# Patient Record
Sex: Female | Born: 1960 | Race: White | Hispanic: No | Marital: Married | Smoking: Former smoker
Health system: Southern US, Community
[De-identification: ages and names within clinical notes are randomized; demographics above are authoritative.]

## PROBLEM LIST (undated history)

## (undated) DIAGNOSIS — F4310 Post-traumatic stress disorder, unspecified: Secondary | ICD-10-CM

## (undated) DIAGNOSIS — K8020 Calculus of gallbladder without cholecystitis without obstruction: Secondary | ICD-10-CM

## (undated) DIAGNOSIS — F319 Bipolar disorder, unspecified: Secondary | ICD-10-CM

## (undated) DIAGNOSIS — F411 Generalized anxiety disorder: Secondary | ICD-10-CM

## (undated) DIAGNOSIS — Z8041 Family history of malignant neoplasm of ovary: Secondary | ICD-10-CM

## (undated) DIAGNOSIS — Z800 Family history of malignant neoplasm of digestive organs: Secondary | ICD-10-CM

## (undated) DIAGNOSIS — Z8481 Family history of carrier of genetic disease: Secondary | ICD-10-CM

## (undated) DIAGNOSIS — F1010 Alcohol abuse, uncomplicated: Secondary | ICD-10-CM

## (undated) DIAGNOSIS — IMO0002 Reserved for concepts with insufficient information to code with codable children: Secondary | ICD-10-CM

## (undated) DIAGNOSIS — R87629 Unspecified abnormal cytological findings in specimens from vagina: Secondary | ICD-10-CM

## (undated) DIAGNOSIS — Z9989 Dependence on other enabling machines and devices: Secondary | ICD-10-CM

## (undated) DIAGNOSIS — F419 Anxiety disorder, unspecified: Secondary | ICD-10-CM

## (undated) DIAGNOSIS — E669 Obesity, unspecified: Secondary | ICD-10-CM

## (undated) DIAGNOSIS — Z9289 Personal history of other medical treatment: Secondary | ICD-10-CM

## (undated) DIAGNOSIS — M1990 Unspecified osteoarthritis, unspecified site: Secondary | ICD-10-CM

## (undated) DIAGNOSIS — Z808 Family history of malignant neoplasm of other organs or systems: Secondary | ICD-10-CM

## (undated) DIAGNOSIS — N883 Incompetence of cervix uteri: Secondary | ICD-10-CM

## (undated) DIAGNOSIS — R9389 Abnormal findings on diagnostic imaging of other specified body structures: Principal | ICD-10-CM

## (undated) DIAGNOSIS — R87619 Unspecified abnormal cytological findings in specimens from cervix uteri: Secondary | ICD-10-CM

## (undated) DIAGNOSIS — R42 Dizziness and giddiness: Secondary | ICD-10-CM

## (undated) DIAGNOSIS — F909 Attention-deficit hyperactivity disorder, unspecified type: Secondary | ICD-10-CM

## (undated) DIAGNOSIS — Z803 Family history of malignant neoplasm of breast: Secondary | ICD-10-CM

## (undated) DIAGNOSIS — G4733 Obstructive sleep apnea (adult) (pediatric): Secondary | ICD-10-CM

## (undated) DIAGNOSIS — H6690 Otitis media, unspecified, unspecified ear: Secondary | ICD-10-CM

## (undated) DIAGNOSIS — J189 Pneumonia, unspecified organism: Secondary | ICD-10-CM

## (undated) DIAGNOSIS — F329 Major depressive disorder, single episode, unspecified: Secondary | ICD-10-CM

## (undated) DIAGNOSIS — I10 Essential (primary) hypertension: Secondary | ICD-10-CM

## (undated) HISTORY — DX: Essential (primary) hypertension: I10

## (undated) HISTORY — PX: COLONOSCOPY: SHX174

## (undated) HISTORY — DX: Dizziness and giddiness: R42

## (undated) HISTORY — DX: Anxiety disorder, unspecified: F41.9

## (undated) HISTORY — DX: Post-traumatic stress disorder, unspecified: F43.10

## (undated) HISTORY — DX: Family history of malignant neoplasm of other organs or systems: Z80.8

## (undated) HISTORY — DX: Family history of malignant neoplasm of breast: Z80.3

## (undated) HISTORY — PX: CERVICAL CERCLAGE: SHX1329

## (undated) HISTORY — DX: Incompetence of cervix uteri: N88.3

## (undated) HISTORY — DX: Major depressive disorder, single episode, unspecified: F32.9

## (undated) HISTORY — PX: CATARACT EXTRACTION: SUR2

## (undated) HISTORY — DX: Family history of malignant neoplasm of ovary: Z80.41

## (undated) HISTORY — DX: Unspecified osteoarthritis, unspecified site: M19.90

## (undated) HISTORY — DX: Family history of carrier of genetic disease: Z84.81

## (undated) HISTORY — PX: JOINT REPLACEMENT: SHX530

## (undated) HISTORY — DX: Obstructive sleep apnea (adult) (pediatric): Z99.89

## (undated) HISTORY — DX: Reserved for concepts with insufficient information to code with codable children: IMO0002

## (undated) HISTORY — DX: Generalized anxiety disorder: F41.1

## (undated) HISTORY — DX: Otitis media, unspecified, unspecified ear: H66.90

## (undated) HISTORY — PX: TONSILLECTOMY: SUR1361

## (undated) HISTORY — DX: Bipolar disorder, unspecified: F31.9

## (undated) HISTORY — DX: Attention-deficit hyperactivity disorder, unspecified type: F90.9

## (undated) HISTORY — DX: Obesity, unspecified: E66.9

## (undated) HISTORY — PX: DILATION AND CURETTAGE OF UTERUS: SHX78

## (undated) HISTORY — DX: Abnormal findings on diagnostic imaging of other specified body structures: R93.89

## (undated) HISTORY — DX: Unspecified abnormal cytological findings in specimens from cervix uteri: R87.619

## (undated) HISTORY — DX: Unspecified abnormal cytological findings in specimens from vagina: R87.629

## (undated) HISTORY — DX: Obstructive sleep apnea (adult) (pediatric): G47.33

## (undated) HISTORY — DX: Family history of malignant neoplasm of digestive organs: Z80.0

---

## 2006-07-07 ENCOUNTER — Encounter: Admission: RE | Admit: 2006-07-07 | Discharge: 2006-07-07 | Payer: Self-pay | Admitting: Internal Medicine

## 2009-06-08 ENCOUNTER — Encounter: Admission: RE | Admit: 2009-06-08 | Discharge: 2009-06-08 | Payer: Self-pay | Admitting: Internal Medicine

## 2009-07-10 ENCOUNTER — Other Ambulatory Visit: Admission: RE | Admit: 2009-07-10 | Discharge: 2009-07-10 | Payer: Self-pay | Admitting: Obstetrics and Gynecology

## 2010-06-29 ENCOUNTER — Ambulatory Visit (HOSPITAL_COMMUNITY): Admission: RE | Admit: 2010-06-29 | Discharge: 2010-06-29 | Payer: Self-pay | Admitting: Obstetrics & Gynecology

## 2010-08-02 ENCOUNTER — Other Ambulatory Visit: Admission: RE | Admit: 2010-08-02 | Discharge: 2010-08-02 | Payer: Self-pay | Admitting: Obstetrics and Gynecology

## 2011-01-21 ENCOUNTER — Encounter: Payer: Self-pay | Admitting: Internal Medicine

## 2011-08-27 ENCOUNTER — Other Ambulatory Visit (HOSPITAL_COMMUNITY)
Admission: RE | Admit: 2011-08-27 | Discharge: 2011-08-27 | Disposition: A | Discharge status: Home / Self Care | Payer: 59 | Source: Ambulatory Visit | Attending: Obstetrics and Gynecology | Admitting: Obstetrics and Gynecology

## 2011-08-27 ENCOUNTER — Other Ambulatory Visit: Payer: Self-pay | Admitting: Adult Health

## 2011-08-27 DIAGNOSIS — Z01419 Encounter for gynecological examination (general) (routine) without abnormal findings: Secondary | ICD-10-CM | POA: Insufficient documentation

## 2011-10-21 ENCOUNTER — Encounter: Payer: Self-pay | Admitting: *Deleted

## 2011-10-21 ENCOUNTER — Emergency Department (HOSPITAL_COMMUNITY): Payer: BC Managed Care – PPO

## 2011-10-21 ENCOUNTER — Emergency Department (HOSPITAL_COMMUNITY)
Admission: EM | Admit: 2011-10-21 | Discharge: 2011-10-21 | Disposition: A | Discharge status: Home / Self Care | Payer: BC Managed Care – PPO | Attending: Emergency Medicine | Admitting: Emergency Medicine

## 2011-10-21 DIAGNOSIS — R079 Chest pain, unspecified: Secondary | ICD-10-CM | POA: Insufficient documentation

## 2011-10-21 DIAGNOSIS — Z7982 Long term (current) use of aspirin: Secondary | ICD-10-CM | POA: Insufficient documentation

## 2011-10-21 DIAGNOSIS — I10 Essential (primary) hypertension: Secondary | ICD-10-CM | POA: Insufficient documentation

## 2011-10-21 DIAGNOSIS — R002 Palpitations: Secondary | ICD-10-CM | POA: Insufficient documentation

## 2011-10-21 DIAGNOSIS — M79603 Pain in arm, unspecified: Secondary | ICD-10-CM

## 2011-10-21 DIAGNOSIS — Z87891 Personal history of nicotine dependence: Secondary | ICD-10-CM | POA: Insufficient documentation

## 2011-10-21 DIAGNOSIS — M79609 Pain in unspecified limb: Secondary | ICD-10-CM | POA: Insufficient documentation

## 2011-10-21 DIAGNOSIS — R209 Unspecified disturbances of skin sensation: Secondary | ICD-10-CM | POA: Insufficient documentation

## 2011-10-21 LAB — BASIC METABOLIC PANEL
BUN: 11 mg/dL (ref 6–23)
Chloride: 103 mEq/L (ref 96–112)
Creatinine, Ser: 0.95 mg/dL (ref 0.50–1.10)
GFR calc Af Amer: 80 mL/min — ABNORMAL LOW (ref >90)
Sodium: 140 mEq/L (ref 135–145)

## 2011-10-21 LAB — CBC
Hemoglobin: 13.1 g/dL (ref 12.0–15.0)
Platelets: 277 10*3/uL (ref 150–400)
RBC: 4.33 MIL/uL (ref 3.87–5.11)
RDW: 13.1 % (ref 11.5–15.5)
WBC: 5.6 10*3/uL (ref 4.0–10.5)

## 2011-10-21 LAB — DIFFERENTIAL
Basophils Absolute: 0 10*3/uL (ref 0.0–0.1)
Lymphs Abs: 1.6 10*3/uL (ref 0.7–4.0)
Monocytes Absolute: 0.4 10*3/uL (ref 0.1–1.0)
Neutro Abs: 3.5 10*3/uL (ref 1.7–7.7)

## 2011-10-21 LAB — CARDIAC PANEL(CRET KIN+CKTOT+MB+TROPI): Troponin I: <0.3 ng/mL (ref <0.30)

## 2011-10-21 NOTE — ED Notes (Signed)
Pt states she could "hear her heart beating in her ear" last night and her BP was 194/109 last night at 2230. Pt states "feels like heart is running away" and "lump in chest like when you get nervous" . Pt also states tingling to left arm since last night. Pt states she was lying on left side last night. States arm hurt last night but is tingling this morning. NAD

## 2011-10-21 NOTE — ED Notes (Signed)
Patient is feeling a little better and does not need anything at this time.

## 2011-10-21 NOTE — ED Provider Notes (Signed)
History   This chart was scribed for Benny Lennert, MD by Clarita Crane. The patient was seen in room APA12/APA12 and the patient's care was started at 11:01AM.   CSN: [id number] Arrival date & time: 10/21/2011  9:57 AM   First MD Initiated Contact with Patient 10/21/11 1057      Chief Complaint  Patient presents with  . left arm numbness.     (Consider location/radiation/quality/duration/timing/severity/associated sxs/prior treatment) HPI [name] [name] is a 50 y.o. female who presents to the Emergency Department complaining of intermittent LUE pain and numbness extending to left hand onset last night and persistent since with associated palpitations. Patient also reports having an episode of chest tightness last night and that she could "hear my heartbeat in my ears". Patient reports she measured her blood pressure with onset of pain last night with a value of 194/109. Denies HA, abdominal pain, nausea, vomiting, SOB. Patient reports she has a h/o hypertension and is currently on a low dose of HCTZ. Also notes she is a former smoker and has a family h/o strokes.   HPI ELEMENTS:  Location: LUE extending to left hand Onset: last night Duration: persistent since onset  Timing: intermittent  Context:  as above  Associated symptoms: +chest tightness, palpitations. Denies HA, abdominal pain, nausea, vomiting, SOB.    Past Medical History  Diagnosis Date  . Hypertension     Past Surgical History  Procedure Date  . Tonsillectomy     No family history on file.  History  Substance Use Topics  . Smoking status: Former Games developer  . Smokeless tobacco: Not on file  . Alcohol Use: 0.6 oz/week    1 Glasses of wine per week     one glass of wine daily    OB History    Grav Para Term Preterm Abortions TAB SAB Ect Mult Living                  Review of Systems  Constitutional: Negative for fatigue.  HENT: Negative for congestion, sinus pressure and ear discharge.   Eyes:  Negative for discharge.  Respiratory: Negative for cough.   Cardiovascular: Positive for chest pain and palpitations.  Gastrointestinal: Negative for abdominal pain and diarrhea.  Genitourinary: Negative for frequency and hematuria.  Musculoskeletal: Negative for back pain.       Left arm pain.   Skin: Negative for rash.  Neurological: Positive for numbness. Negative for seizures and headaches.  Hematological: Negative.   Psychiatric/Behavioral: Negative for hallucinations.    Allergies  Review of patient's allergies indicates no known allergies.  Home Medications   Current Outpatient Rx  Name Route Sig Dispense Refill  . ASPIRIN EC 81 MG PO TBEC Oral Take 81 mg by mouth daily as needed. pain     . HYDROCHLOROTHIAZIDE 12.5 MG PO CAPS Oral Take 12.5 mg by mouth daily.        BP 131/73  Pulse 85  Temp(Src) 97.7 F (36.5 C) (Oral)  Resp 14  Ht 5\' 6"  (1.676 m)  Wt 205 lb (92.987 kg)  BMI 33.09 kg/m2  SpO2 100%  Physical Exam  Nursing note and vitals reviewed. Constitutional: She is oriented to person, place, and time. She appears well-developed and well-nourished. No distress.  HENT:  Head: Normocephalic and atraumatic.  Eyes: Conjunctivae and EOM are normal. No scleral icterus.  Neck: Neck supple.  Cardiovascular: Normal rate, regular rhythm and normal heart sounds.  Exam reveals no gallop and no friction rub.  No murmur heard. Pulmonary/Chest: Effort normal. No stridor. No respiratory distress. She has no wheezes. She has no rales.  Abdominal: Soft. She exhibits no distension. There is no tenderness. There is no rebound.  Musculoskeletal: Normal range of motion. She exhibits no edema.  Neurological: She is alert and oriented to person, place, and time.  Skin: Skin is warm and dry.  Psychiatric: She has a normal mood and affect. Her behavior is normal.    ED Course  Procedures (including critical care time)  DIAGNOSTIC STUDIES: Oxygen Saturation is 97% on room  air, normal by my interpretation.    COORDINATION OF CARE: 2:42PM- Patient expresses interest in being d/c home. States LUE pain and numbness has improved at this time. Informed of lab and imaging results. Will d/c patient home with recommendation to take 1 aspirin a day and follow up with cardiology.   Labs Reviewed  BASIC METABOLIC PANEL - Abnormal; Notable for the following:    Glucose, Bld 114 (*)    GFR calc non Af Amer 69 (*)    GFR calc Af Amer 80 (*)    All other components within normal limits  CBC  DIFFERENTIAL  POCT I-STAT TROPONIN I  CARDIAC PANEL(CRET KIN+CKTOT+MB+TROPI)  I-STAT TROPONIN I   Ct Head Wo Contrast  10/21/2011  *RADIOLOGY REPORT*  Clinical Data: Left upper extremity numbness, pain  CT HEAD WITHOUT CONTRAST  Technique:  Contiguous axial images were obtained from the base of the skull through the vertex without contrast.  Comparison: None.  Findings: No evidence of parenchymal hemorrhage or extra-axial fluid collection. No mass lesion, mass effect, or midline shift.  No CT evidence of acute infarction.  Subcortical white matter and periventricular hypodensities, nonspecific, but statistically likely reflecting small vessel ischemic changes.  The visualized paranasal sinuses are essentially clear. The mastoid air cells are unopacified.  No evidence of calvarial fracture.  IMPRESSION: No evidence of acute intracranial abnormality.  Suspected small vessel ischemic changes.  Original Report Authenticated By: Charline Bills, M.D.   Dg Chest Portable 1 View  10/21/2011  *RADIOLOGY REPORT*  Clinical Data: Chest pain and hypertension  PORTABLE CHEST - 1 VIEW  Comparison: None.  Findings: The cardiac silhouette, mediastinum, pulmonary vasculature are within normal limits.  Both lungs are clear. There is no acute bony abnormality.  IMPRESSION: There is no evidence of acute cardiac or pulmonary process.  Original Report Authenticated By: Brandon Melnick, M.D.     1. Arm pain        MDM  Arm pain and numbness.  Neuritis,  Stress reaction   Date: 10/21/2011  Rate:80  Rhythm: normal sinus rhythm  QRS Axis: normal  Intervals: normal  ST/T Wave abnormalities: normal nonspecific st changes  Conduction Disutrbances:none  Narrative Interpretation:   Old EKG Reviewed: none available    Benny Lennert, MD 10/21/11 1500

## 2011-10-30 ENCOUNTER — Encounter: Payer: Self-pay | Admitting: Cardiology

## 2011-11-05 ENCOUNTER — Encounter: Payer: Self-pay | Admitting: Cardiology

## 2011-11-05 ENCOUNTER — Ambulatory Visit (INDEPENDENT_AMBULATORY_CARE_PROVIDER_SITE_OTHER): Payer: BC Managed Care – PPO | Admitting: Cardiology

## 2011-11-05 VITALS — BP 182/92 | HR 89 | Resp 16 | Ht 66.0 in | Wt 211.0 lb

## 2011-11-05 DIAGNOSIS — M79609 Pain in unspecified limb: Secondary | ICD-10-CM | POA: Insufficient documentation

## 2011-11-05 DIAGNOSIS — R9431 Abnormal electrocardiogram [ECG] [EKG]: Secondary | ICD-10-CM | POA: Insufficient documentation

## 2011-11-05 DIAGNOSIS — I10 Essential (primary) hypertension: Secondary | ICD-10-CM

## 2011-11-05 NOTE — Assessment & Plan Note (Signed)
Nonspecific, possible left atrial enlargement on initial tracing, although less evident today. Could be consistent with hypertension in general.

## 2011-11-05 NOTE — Patient Instructions (Signed)
Your physician recommends that you continue on your current medications as directed. Please refer to the Current Medication list given to you today.  Your physician has requested that you have an exercise tolerance test. For further information please visit [email or web address] Please also follow instruction sheet, as given.  Your physician recommends that you schedule a follow-up appointment in: we will contact you with results of test

## 2011-11-05 NOTE — Progress Notes (Signed)
Clinical Summary [name] is a 50 y.o.female referred for cardiology consultation after recent ER visit, by Dr. Estell [name]. She states that she follows with a nurse practitioner at her place of business, and has also seen Dr. Regino [name] in the past. She reports a history of hypertension on medical therapy for at least the last 2 years. She states that she tends to avoid regular doctor visits.  She states that her blood pressure was elevated at "194/109" back in late October with some "numbness in left arm with feeling of swelling." She states that this symptom lasted for several hours. She did not seek medical attention until the following day. Emergency department evaluation revealed no acute findings by head CT scan, no acute process by chest x-ray. Lab work showed normal troponin, potassium 3.7, BUN 11, creatinine 0.9.  She reports that she gained a significant amount of weight after quitting smoking several years ago. Has not been exercising regularly. She reports compliance with low dose HCTZ.  ECG from October 22 showed sinus rhythm with nonspecific ST changes and possible left atrial enlargement. Repeat tracing today is essentially normal.  Today we discussed risk factor modification strategies, diet and sodium restriction, exercise and weight loss. I also reinforced the importance of establishing regular primary care followup.   No Known Allergies  Medication list reviewed.  Past Medical History  Diagnosis Date  . Essential hypertension, benign     Past Surgical History  Procedure Date  . Tonsillectomy     Family History  Problem Relation Age of Onset  . Hypertension Father   . Hypertension Mother   . Cancer Mother   . Coronary artery disease Maternal Grandfather     Social History [name] reports that she has quit smoking. Her smoking use included Cigarettes. She has never used smokeless tobacco. [name] reports that she drinks about .6 ounces of alcohol per week.  Review  of Systems Occasional palpitations, no dizziness or syncope. No orthopnea or PND. No lower extremity edema. Reports work-related stress. Otherwise reviewed and negative.  Physical Examination Filed Vitals:   11/05/11 0847  BP: 182/92  Pulse: 89  Resp: 16   Overweight woman in no acute distress. HEENT: Conjunctiva and lids normal, oropharynx with moist mucosa. Neck: Supple, no elevated JVP or carotid bruits, no thyromegaly. Lungs: Clear to auscultation, nonlabored. Cardiac: Regular rate and rhythm, no S3 gallop or rub, no significant murmur. Abdomen: Soft, no bruits, bowel sounds present, nontender. Skin: Warm and dry. Extremities: No pitting edema, distal pulses full. Musculoskeletal: No kyphosis. Neuropsychiatric: Alert and oriented x3, moves all extremities equally. Affect grossly appropriate.    Problem List and Plan

## 2011-11-05 NOTE — Assessment & Plan Note (Signed)
Blood pressure reading is elevated with baseline history of hypertension. Patient reports compliance with low dose HCTZ. Today I reinforced the importance of establishing regular primary care followup for her blood pressure. We also discussed diet and sodium restriction, exercise, and weight loss. Recent lab work was reviewed. I suggested that she increase her HCTZ to 25 mg daily, and schedule a followup visit with Dr. Edison Simon office. She may ultimately need a potassium supplement, possibly even additional antihypertensive therapy, although I hope that lifestyle modification will lead to improvement in her blood pressure as well.

## 2011-11-05 NOTE — Assessment & Plan Note (Signed)
Etiology not certain. Recent head CT scan did not suggest acute event. She reports no recurrence. Troponin was normal at ER visit. Symptoms were noted in the setting of significant blood pressure elevation. In light of this, and other features already discussed, a basic treadmill test will be undertaken for general risk factor modification. This will also provide documentation of exercise blood pressure control.

## 2011-11-14 ENCOUNTER — Ambulatory Visit (INDEPENDENT_AMBULATORY_CARE_PROVIDER_SITE_OTHER): Payer: BC Managed Care – PPO | Admitting: *Deleted

## 2011-11-14 DIAGNOSIS — R9431 Abnormal electrocardiogram [ECG] [EKG]: Secondary | ICD-10-CM

## 2011-11-14 DIAGNOSIS — I10 Essential (primary) hypertension: Secondary | ICD-10-CM

## 2011-11-14 DIAGNOSIS — M79609 Pain in unspecified limb: Secondary | ICD-10-CM

## 2011-11-14 NOTE — Progress Notes (Signed)
Stress Lab Nurses Notes - [facility]  IDY RAWLING 11/14/2011  Reason for doing test: HTN and pain discomfort in left arm Type of test: Regular GTX Nurse performing test: Parke Poisson, RN Nuclear Medicine Tech: Not Applicable Echo Tech: Not Applicable MD performing test: Ival Bible & Joni Reining NP Family [id number] Test explained and consent signed: yes IV started: No IV started Symptoms: Fatigue in R Knee and Feeling hot Treatment/Intervention: None Reason test stopped: fatigue After recovery IV was: NA Patient to return to Nuc. Med at :NA Patient discharged: Home Patient's Condition upon discharge was: stable Comments: During test peak BP 208/80 & HR 172. Recovery BP 148/80 & HR 98.  Symptoms resolved in recovery.  Erskine Speed T

## 2011-11-14 NOTE — Progress Notes (Signed)
Patient exercised on a standard Bruce protocol for 7 minutes and 28 seconds, achieving a maximum workload of 9.2 METS. Heart rate increased from 79 beats per minute up to 171 beats per minute reaching 100% of the maximum age predicted heart rate response. Peak blood pressure was 208/80. No anginal chest pain was reported. Baseline ECG shows nonspecific inferolateral ST segment change with approximately 1/2 mm depression at rest and T wave flattening. There is accentuation in these abnormalities with exercise, overall equivocal. No arrhythmias were noted.

## 2011-11-18 ENCOUNTER — Telehealth: Payer: Self-pay | Admitting: Cardiology

## 2011-11-18 ENCOUNTER — Telehealth: Payer: Self-pay

## 2011-11-18 NOTE — Telephone Encounter (Signed)
PT IS RETURNING CALL. SHE DOESNT KNOW WHO CALLED BUT HAD A STRESS TEST DONE LAST WEEK/TMJ

## 2011-11-18 NOTE — Telephone Encounter (Signed)
Message copied by Demetrios Loll on Mon Nov 18, 2011 10:54 AM ------      Message from: MCDOWELL, Illene Bolus      Created: Thu Nov 14, 2011  5:23 PM       I reviewed the GXT report. No chest pain, and equivocal ST segment changes noted in the setting of baseline ST abnormalities, likely repolarization related to hypertension. There was a significant hypertensive response to exercise. At this point would recommend risk factor modification and medical therapy for high blood pressure, with followup at her primary care office. We can certainly see her back if symptoms progress.

## 2012-01-07 ENCOUNTER — Encounter (HOSPITAL_COMMUNITY): Payer: Self-pay | Admitting: *Deleted

## 2012-01-07 ENCOUNTER — Emergency Department (HOSPITAL_COMMUNITY)
Admission: EM | Admit: 2012-01-07 | Discharge: 2012-01-07 | Disposition: A | Discharge status: Home / Self Care | Payer: BC Managed Care – PPO | Attending: Emergency Medicine | Admitting: Emergency Medicine

## 2012-01-07 DIAGNOSIS — L259 Unspecified contact dermatitis, unspecified cause: Secondary | ICD-10-CM | POA: Insufficient documentation

## 2012-01-07 DIAGNOSIS — Z79899 Other long term (current) drug therapy: Secondary | ICD-10-CM | POA: Insufficient documentation

## 2012-01-07 DIAGNOSIS — I10 Essential (primary) hypertension: Secondary | ICD-10-CM | POA: Insufficient documentation

## 2012-01-07 DIAGNOSIS — Z7982 Long term (current) use of aspirin: Secondary | ICD-10-CM | POA: Insufficient documentation

## 2012-01-07 DIAGNOSIS — Z203 Contact with and (suspected) exposure to rabies: Secondary | ICD-10-CM | POA: Insufficient documentation

## 2012-01-07 DIAGNOSIS — Z23 Encounter for immunization: Secondary | ICD-10-CM | POA: Insufficient documentation

## 2012-01-07 NOTE — ED Notes (Signed)
Pt states they were taking care of dogs that had been exposed to rabies.  No blood or saliva exposure

## 2012-01-07 NOTE — ED Notes (Signed)
Pt left before being DC, unable to obtain signature

## 2012-01-07 NOTE — ED Provider Notes (Addendum)
The patient is a 51 year old, female.  Her dogs were attack by a skunk 4 days ago.  The stomach was rapid.  There was a live on the dogs for.  She cleaned.  Her saliva off the dogs with her bare hands.  She was sent here for rabies vaccination.  There are no breaks in the skin of her hands or her arms.  She has lichenified skin on her left thumb.  There is no evidence that she had a break in her skin and saliva exposure to compromise.  Skin.  I do not think that she needs rabies vaccination.  Nicholes Stairs, MD 01/07/12 [phone]  Medical screening examination/treatment/procedure(s) were conducted as a shared visit with non-physician practitioner(s) and myself.  I personally evaluated the patient during the encounter  Nicholes Stairs, MD 01/07/12 1620

## 2012-01-07 NOTE — ED Notes (Signed)
Exposure to rabid skunk

## 2012-01-08 NOTE — ED Provider Notes (Addendum)
History     CSN: [id number]  Arrival date & time 01/07/12  1351   First MD Initiated Contact with Patient 01/07/12 1458      Chief Complaint  Patient presents with  . Rabies Injection    (Consider location/radiation/quality/duration/timing/severity/associated sxs/prior treatment) HPI Comments: Patients daughter woke 4 days ago to find a skunk in her dog lot chasing and attempting to attack her dogs.  The animal was killed and found to be positive for rabies today.  She reports her exposure was possibly to saliva on her dogs as she helped her daughter move the dogs from the lot and bathed them,  Although she did not have any direct contact with blood (no blood or bites found on her animals).  Her dogs were re- immunized by her vet even though they were current on their rabies vaccines.  She was encouraged to come here for rabies vaccination.   The history is provided by the patient.    Past Medical History  Diagnosis Date  . Essential hypertension, benign     Past Surgical History  Procedure Date  . Tonsillectomy     Family History  Problem Relation Age of Onset  . Hypertension Father   . Hypertension Mother   . Cancer Mother   . Coronary artery disease Maternal Grandfather     History  Substance Use Topics  . Smoking status: Former Smoker    Types: Cigarettes  . Smokeless tobacco: Never Used  . Alcohol Use: 0.6 oz/week    1 Glasses of wine per week     One glass of wine daily    OB History    Grav Para Term Preterm Abortions TAB SAB Ect Mult Living                  Review of Systems  Constitutional: Negative for fever.  HENT: Negative.   Eyes: Negative.   Respiratory: Negative.   Cardiovascular: Negative.   Gastrointestinal: Negative.   Genitourinary: Negative.   Musculoskeletal: Negative for joint swelling and arthralgias.  Skin: Negative.  Negative for rash and wound.  Neurological: Negative for dizziness, weakness, light-headedness, numbness and  headaches.  Hematological: Negative.   Psychiatric/Behavioral: Negative.     Allergies  Prednisone  Home Medications   Current Outpatient Rx  Name Route Sig Dispense Refill  . ASPIRIN EC 81 MG PO TBEC Oral Take 81 mg by mouth at bedtime. pain    . HYDROCHLOROTHIAZIDE 25 MG PO TABS Oral Take 25 mg by mouth daily.        BP 158/91  Pulse 93  Temp(Src) 98.3 F (36.8 C) (Oral)  Resp 20  Ht 5\' 6"  (1.676 m)  Wt 215 lb (97.523 kg)  BMI 34.70 kg/m2  SpO2 96%  Physical Exam  Nursing note and vitals reviewed. Constitutional: She is oriented to person, place, and time. She appears well-developed and well-nourished.  HENT:  Head: Normocephalic and atraumatic.  Eyes: Conjunctivae are normal.  Neck: Normal range of motion.  Cardiovascular: Normal rate.   Pulmonary/Chest: Effort normal and breath sounds normal.  Musculoskeletal: Normal range of motion.  Neurological: She is alert and oriented to person, place, and time.  Skin: Skin is warm and dry.       Hyperkeratotic area on left lateral hand from eczema.  No open lesions.  Psychiatric: She has a normal mood and affect.    ED Course  Procedures (including critical care time)  Labs Reviewed - No data to display No results  found.   1. Contact with and suspected exposure to rabies       MDM  Discussed risk factors for rabies including blood to blood or saliva to blood contact.  Patient also seen by Dr. Weldon Inches who agrees that contact was not one requiring rabies immunoglobulin or vaccine.         Candis Musa, PA 01/08/12 2235    Patient and other family members (daughter and her fiance)  were offered the vaccines by Dr. Weldon Inches,  If desired,  But not medically indicated.  Ms. Kamaljit Hizer deferred,  But stated she just needed this statement in writing that the shots are not needed so she can show this to the [facility],  As they had called patients numerous times insisting they  come in here to get treated.    Candis Musa, PA 01/09/12 1744

## 2012-01-09 NOTE — ED Provider Notes (Signed)
Medical screening examination/treatment/procedure(s) were conducted as a shared visit with non-physician practitioner(s) and myself.  I personally evaluated the patient during the encounter  Nicholes Stairs, MD 01/09/12 [phone]

## 2012-01-14 NOTE — ED Provider Notes (Signed)
Medical screening examination/treatment/procedure(s) were conducted as a shared visit with non-physician practitioner(s) and myself.  I personally evaluated the patient during the encounter  Nicholes Stairs, MD 01/14/12 [phone]

## 2012-01-23 ENCOUNTER — Other Ambulatory Visit: Payer: Self-pay | Admitting: Adult Health

## 2012-01-23 DIAGNOSIS — N63 Unspecified lump in unspecified breast: Secondary | ICD-10-CM

## 2012-01-29 ENCOUNTER — Ambulatory Visit (HOSPITAL_COMMUNITY)
Admission: RE | Admit: 2012-01-29 | Discharge: 2012-01-29 | Disposition: A | Discharge status: Home / Self Care | Payer: BC Managed Care – PPO | Source: Ambulatory Visit | Attending: Adult Health | Admitting: Adult Health

## 2012-01-29 ENCOUNTER — Ambulatory Visit (HOSPITAL_COMMUNITY): Admission: RE | Admit: 2012-01-29 | Payer: BC Managed Care – PPO | Source: Ambulatory Visit

## 2012-01-29 DIAGNOSIS — N63 Unspecified lump in unspecified breast: Secondary | ICD-10-CM

## 2012-03-20 ENCOUNTER — Encounter: Payer: Self-pay | Admitting: Pulmonary Disease

## 2012-03-20 ENCOUNTER — Ambulatory Visit (INDEPENDENT_AMBULATORY_CARE_PROVIDER_SITE_OTHER): Payer: BC Managed Care – PPO | Admitting: Pulmonary Disease

## 2012-03-20 VITALS — BP 146/88 | HR 98 | Temp 97.8°F | Ht 66.0 in | Wt 223.0 lb

## 2012-03-20 DIAGNOSIS — G4733 Obstructive sleep apnea (adult) (pediatric): Secondary | ICD-10-CM

## 2012-03-20 NOTE — Progress Notes (Signed)
  Subjective:    Patient ID: [name] [name], female    DOB: 1961/04/06, 51 y.o.   MRN: [id number]  HPI The patient is a 51 year old female who comes in today for evaluation of possible obstructive sleep apnea.  She's been noted to have loud snoring, as well as an abnormal breathing pattern during sleep by her husband.  She has frequent awakenings at night, and does not feel rested in the mornings upon arising.  She denies sleep pressure during the day in her job because she stays very busy, but she does begin to feel sleep pressure at lunch and later in the afternoon.  She also has sleepiness in the evenings watching television or movies.  She denies any sleepiness with driving.  Of note, the patient's weight is up 30 pounds over the last one year, and her Epworth score today is 12.  Sleep Questionnaire: What time do you typically go to bed?( Between what hours) 9 to 10 pm How long does it take you to fall asleep? 10 to 20 mins How many times during the night do you wake up? What time do you get out of bed to start your day? 0600 Do you drive or operate heavy machinery in your occupation? No How much has your weight changed (up or down) over the past two years? (In pounds) 25 lb (11.34 kg) Have you ever had a sleep study before? No Do you currently use CPAP? No Do you wear oxygen at any time?     Review of Systems  Constitutional: Positive for unexpected weight change. Negative for fever.  HENT: Negative for ear pain, nosebleeds, congestion, sore throat, rhinorrhea, sneezing, trouble swallowing, dental problem, postnasal drip and sinus pressure.   Eyes: Negative for redness and itching.  Respiratory: Negative for cough, chest tightness, shortness of breath and wheezing.   Cardiovascular: Negative for palpitations and leg swelling.  Gastrointestinal: Negative for nausea and vomiting.  Genitourinary: Negative for dysuria.  Musculoskeletal: Positive for joint swelling.  Skin: Negative for rash.    Neurological: Negative for headaches.  Hematological: Does not bruise/bleed easily.  Psychiatric/Behavioral: Negative for dysphoric mood. The patient is not nervous/anxious.        Objective:   Physical Exam Constitutional:  Obese female, no acute distress  HENT:  Nares patent without discharge  Oropharynx without exudate, palate and uvula are moderately elongated.   Eyes:  Perrla, eomi, no scleral icterus  Neck:  No JVD, no TMG  Cardiovascular:  Normal rate, regular rhythm, no rubs or gallops.  No murmurs        Intact distal pulses  Pulmonary :  Normal breath sounds, no stridor or respiratory distress   No rales, rhonchi, or wheezing  Abdominal:  Soft, nondistended, bowel sounds present.  No tenderness noted.   Musculoskeletal:  No lower extremity edema noted.  Lymph Nodes:  No cervical lymphadenopathy noted  Skin:  No cyanosis noted  Neurologic:  Alert, appropriate, moves all 4 extremities without obvious deficit.         Assessment & Plan:

## 2012-03-20 NOTE — Patient Instructions (Signed)
Will set up for home sleep testing, and will arrange followup once results available.  Work on weight loss.

## 2012-03-20 NOTE — Assessment & Plan Note (Signed)
The patient has a history of loud snoring, and abnormal breathing pattern during sleep, frequent awakenings, and nonrestorative sleep.  This when combined with her excessive weight gain over the last one year is very suspicious for clinically significant sleep apnea.  I have had a long discussion with her about sleep apnea, including its impact on quality of life and cardiovascular health.  I think she needs a sleep study for evaluation, and is a good candidate for home sleep testing.

## 2012-03-26 ENCOUNTER — Ambulatory Visit (INDEPENDENT_AMBULATORY_CARE_PROVIDER_SITE_OTHER): Payer: BC Managed Care – PPO | Admitting: Pulmonary Disease

## 2012-03-26 DIAGNOSIS — G4733 Obstructive sleep apnea (adult) (pediatric): Secondary | ICD-10-CM

## 2012-03-30 ENCOUNTER — Telehealth: Payer: Self-pay | Admitting: Pulmonary Disease

## 2012-03-30 NOTE — Telephone Encounter (Signed)
Will forward to [facility] nurse per triage protocol.

## 2012-03-30 NOTE — Telephone Encounter (Signed)
Patient needs ov to review sleep study results

## 2012-03-31 NOTE — Telephone Encounter (Signed)
Called and spoke with pt.  Pt states she will have to check her schedule regarding an appt date/time and will call me back.

## 2012-03-31 NOTE — Telephone Encounter (Signed)
Pt called back and is scheduled to see [facility] tomorrow at 4 pm

## 2012-04-01 ENCOUNTER — Encounter: Payer: Self-pay | Admitting: Pulmonary Disease

## 2012-04-01 ENCOUNTER — Ambulatory Visit (INDEPENDENT_AMBULATORY_CARE_PROVIDER_SITE_OTHER): Payer: BC Managed Care – PPO | Admitting: Pulmonary Disease

## 2012-04-01 VITALS — BP 132/84 | HR 85 | Temp 98.1°F | Ht 66.0 in | Wt 221.4 lb

## 2012-04-01 DIAGNOSIS — G4733 Obstructive sleep apnea (adult) (pediatric): Secondary | ICD-10-CM

## 2012-04-01 NOTE — Patient Instructions (Signed)
Will start on cpap at a moderate level.  Please call if having issues with tolerance Work on weight loss followup with me in 5 weeks.

## 2012-04-01 NOTE — Assessment & Plan Note (Signed)
The patient has mild to moderate obstructive sleep apnea by her recent sleep study, and I have reviewed the various treatment options with her.  These include a trial of weight loss, upper airway surgery, dental appliance, and also CPAP.  I think a dental appliance and CPAP while working on weight loss and represent her best treatment options given the fact that she is so symptomatic, and after a long discussion we have decided to try CPAP first. I will set the patient up on cpap at a moderate pressure level to allow for desensitization, and will troubleshoot the device over the next 4-6weeks if needed.  The pt is to call me if having issues with tolerance.  Will then optimize the pressure once patient is able to wear cpap on a consistent basis.

## 2012-04-01 NOTE — Progress Notes (Signed)
  Subjective:    Patient ID: [name] [name], female    DOB: [birth year], 51 y.o.   MRN: [id number]  HPI The patient comes in today for followup of her recent sleep test.  She was found to have an AHI of 14 events per hour, with transient desaturation as low as 71%.  I have reviewed the study with her in detail, and answered all of her questions.   Review of Systems  Constitutional: Negative.  Negative for fever and unexpected weight change.  HENT: Negative.  Negative for ear pain, nosebleeds, congestion, sore throat, rhinorrhea, sneezing, trouble swallowing, dental problem, postnasal drip and sinus pressure.   Eyes: Negative.  Negative for redness and itching.  Respiratory: Negative.  Negative for cough, chest tightness, shortness of breath and wheezing.   Cardiovascular: Negative.  Negative for palpitations and leg swelling.  Gastrointestinal: Negative.  Negative for nausea and vomiting.  Genitourinary: Negative.  Negative for dysuria.  Musculoskeletal: Negative.  Negative for joint swelling.  Skin: Negative.  Negative for rash.  Neurological: Negative.  Negative for headaches.  Hematological: Negative.  Does not bruise/bleed easily.  Psychiatric/Behavioral: Negative.  Negative for dysphoric mood. The patient is not nervous/anxious.        Objective:   Physical Exam Overweight female in no acute distress Nose without purulence or discharge noted Lower extremities without edema, no cyanosis Alert and oriented, moves all 4 extremities.       Assessment & Plan:

## 2012-05-08 ENCOUNTER — Ambulatory Visit: Payer: BC Managed Care – PPO | Admitting: Pulmonary Disease

## 2012-05-26 ENCOUNTER — Encounter: Payer: Self-pay | Admitting: Pulmonary Disease

## 2012-05-26 ENCOUNTER — Ambulatory Visit (INDEPENDENT_AMBULATORY_CARE_PROVIDER_SITE_OTHER): Payer: BC Managed Care – PPO | Admitting: Pulmonary Disease

## 2012-05-26 VITALS — BP 118/78 | HR 85 | Temp 97.9°F | Ht 66.0 in | Wt 222.2 lb

## 2012-05-26 DIAGNOSIS — G4733 Obstructive sleep apnea (adult) (pediatric): Secondary | ICD-10-CM

## 2012-05-26 NOTE — Progress Notes (Signed)
  Subjective:    Patient ID: [name] [name], female    DOB: 1961/05/30, 51 y.o.   MRN: [id number]  HPI The patient comes in today for followup of her known obstructive sleep apnea.  She has been started on CPAP, and has been wearing the device compliantly.  She feels that she sleeps well during the night, and has had improvement in her daytime alertness.  She is having no significant mask issues or pressure or intolerances.   Review of Systems  Constitutional: Negative.  Negative for fever and unexpected weight change.  HENT: Negative.  Negative for ear pain, nosebleeds, congestion, sore throat, rhinorrhea, sneezing, trouble swallowing, dental problem, postnasal drip and sinus pressure.   Eyes: Negative.  Negative for redness and itching.  Respiratory: Negative.  Negative for cough, chest tightness, shortness of breath and wheezing.   Cardiovascular: Negative.  Negative for palpitations and leg swelling.  Gastrointestinal: Negative.  Negative for nausea and vomiting.  Genitourinary: Negative.  Negative for dysuria.  Musculoskeletal: Negative.  Negative for joint swelling.  Skin: Negative.  Negative for rash.  Neurological: Negative.  Negative for headaches.  Hematological: Negative.  Does not bruise/bleed easily.  Psychiatric/Behavioral: Negative.  Negative for dysphoric mood. The patient is not nervous/anxious.        Objective:   Physical Exam Obese female in no acute distress No skin breakdown or pressure necrosis from the CPAP mask Lower extremities with no edema, no cyanosis Alert and oriented, moves all 4 extremities.  Does not appear to be overly sleepy.       Assessment & Plan:

## 2012-05-26 NOTE — Assessment & Plan Note (Signed)
The patient is wearing CPAP compliantly for treatment of her obstructive sleep apnea.  She feels that she has had improvement in her sleep and daytime alertness.  At this point, we need to optimize her pressure, and will do this on the automatic setting.  I will let her know the results of the download.  I've also encouraged her to work aggressively on weight loss.

## 2012-05-26 NOTE — Patient Instructions (Signed)
Will optimize your pressure on the auto setting for 2 weeks, and let you know the results. Work on weight loss followup with me in 6mos.

## 2012-06-16 ENCOUNTER — Ambulatory Visit: Payer: BC Managed Care – PPO | Admitting: Pulmonary Disease

## 2012-06-29 ENCOUNTER — Ambulatory Visit: Payer: BC Managed Care – PPO | Admitting: Pulmonary Disease

## 2012-07-22 ENCOUNTER — Other Ambulatory Visit: Payer: Self-pay | Admitting: Obstetrics & Gynecology

## 2012-08-02 ENCOUNTER — Other Ambulatory Visit: Payer: Self-pay | Admitting: Pulmonary Disease

## 2012-08-02 DIAGNOSIS — G4733 Obstructive sleep apnea (adult) (pediatric): Secondary | ICD-10-CM

## 2012-08-04 ENCOUNTER — Telehealth: Payer: Self-pay | Admitting: Pulmonary Disease

## 2012-08-04 DIAGNOSIS — G4733 Obstructive sleep apnea (adult) (pediatric): Secondary | ICD-10-CM

## 2012-08-04 NOTE — Telephone Encounter (Signed)
This message is per rhonda. Please advise KC thanks

## 2012-08-04 NOTE — Telephone Encounter (Signed)
Ok with me to keep on auto setting.

## 2012-08-05 NOTE — Telephone Encounter (Signed)
I have placed order to [facility] for this and pt is aware. She had no questions and needed nothing further

## 2012-10-07 ENCOUNTER — Other Ambulatory Visit: Payer: Self-pay | Admitting: Adult Health

## 2012-10-07 DIAGNOSIS — Z01419 Encounter for gynecological examination (general) (routine) without abnormal findings: Secondary | ICD-10-CM | POA: Insufficient documentation

## 2012-10-07 DIAGNOSIS — Z1151 Encounter for screening for human papillomavirus (HPV): Secondary | ICD-10-CM | POA: Insufficient documentation

## 2012-10-14 ENCOUNTER — Other Ambulatory Visit (HOSPITAL_COMMUNITY)
Admission: RE | Admit: 2012-10-14 | Discharge: 2012-10-14 | Disposition: A | Discharge status: Home / Self Care | Payer: BC Managed Care – PPO | Source: Ambulatory Visit | Attending: Obstetrics and Gynecology | Admitting: Obstetrics and Gynecology

## 2012-11-10 ENCOUNTER — Telehealth: Payer: Self-pay | Admitting: Pulmonary Disease

## 2012-11-10 NOTE — Telephone Encounter (Signed)
Recall Appointment: °Called patient to schedule follow up apt 3 times and left message. Sent letter 11/10/12.  °

## 2013-04-14 ENCOUNTER — Encounter: Payer: Self-pay | Admitting: *Deleted

## 2013-04-14 DIAGNOSIS — F419 Anxiety disorder, unspecified: Secondary | ICD-10-CM | POA: Insufficient documentation

## 2013-04-14 DIAGNOSIS — I10 Essential (primary) hypertension: Secondary | ICD-10-CM

## 2013-04-15 ENCOUNTER — Encounter: Payer: Self-pay | Admitting: Adult Health

## 2013-04-15 ENCOUNTER — Ambulatory Visit (INDEPENDENT_AMBULATORY_CARE_PROVIDER_SITE_OTHER): Payer: BC Managed Care – PPO | Admitting: Adult Health

## 2013-04-15 VITALS — BP 126/88 | HR 80 | Ht 66.0 in | Wt 237.0 lb

## 2013-04-15 DIAGNOSIS — F32A Depression, unspecified: Secondary | ICD-10-CM | POA: Insufficient documentation

## 2013-04-15 DIAGNOSIS — I10 Essential (primary) hypertension: Secondary | ICD-10-CM

## 2013-04-15 DIAGNOSIS — F411 Generalized anxiety disorder: Secondary | ICD-10-CM

## 2013-04-15 DIAGNOSIS — F329 Major depressive disorder, single episode, unspecified: Secondary | ICD-10-CM

## 2013-04-15 DIAGNOSIS — F419 Anxiety disorder, unspecified: Secondary | ICD-10-CM

## 2013-04-15 HISTORY — DX: Depression, unspecified: F32.A

## 2013-04-15 NOTE — Progress Notes (Signed)
Subjective:     Patient ID: [name] [name], female   DOB: 01-08-1961, 52 y.o.   MRN: [id number]  HPI [name] [name] is a 13 year white female in for follow up of anxiety and depression.She saw Velta Addison 4/15 and has a follow up in 2 months.She is currently on Lamictal 100 mg per day and lexapro 20 mg per day and xanax prn. She is doing better and is getting out of the house and recently went on a field trip to [place] with god daughter(which is a big step for her).   Review of SystemsComplains of weight gain, about 30 pounds in 6 months.Still has bad days, but is better. Reviewed past medical, surgical, social and family history. Reviewed medications and allergies.     Objective:   Physical Exam Blood pressure 126/88, pulse 80, height 5\' 6"  (1.676 m), weight 237 lb (107.502 kg).   Skin:warm and dry. Lungs: clear to ausculation bilaterally. Cardiovascular: regular rate and rhythm. Psych: alert, cooperative, mood better Assessment:      Anxiety Depression  Hypertension    Plan:      Continue current meds Increase activity and decrease carbs See Casimiro Needle as scheduled Follow up in 6 months Call prn problems

## 2013-04-15 NOTE — Patient Instructions (Addendum)
Follow up 6 months Increase activity Watch carbs Sign up for my chart

## 2013-06-08 ENCOUNTER — Ambulatory Visit (INDEPENDENT_AMBULATORY_CARE_PROVIDER_SITE_OTHER): Payer: BC Managed Care – PPO | Admitting: Pulmonary Disease

## 2013-06-08 ENCOUNTER — Encounter: Payer: Self-pay | Admitting: Gastroenterology

## 2013-06-08 ENCOUNTER — Encounter: Payer: Self-pay | Admitting: Pulmonary Disease

## 2013-06-08 VITALS — BP 118/78 | HR 90 | Temp 98.0°F | Ht 66.0 in | Wt 247.6 lb

## 2013-06-08 DIAGNOSIS — G4733 Obstructive sleep apnea (adult) (pediatric): Secondary | ICD-10-CM

## 2013-06-08 NOTE — Progress Notes (Signed)
  Subjective:    Patient ID: [name] [name], female    DOB: April 04, 1961, 52 y.o.   MRN: [id number]  HPI The patient comes in today for followup of her obstructive sleep apnea.  She is wearing CPAP compliantly, and feels that it continues to help her sleep and daytime alertness.  She is having mask issues with the full face mask, and would like to consider going back to nasal pillows.  She had lost significant weight, but unfortunately has gained this back.   Review of Systems  Constitutional: Negative for fever and unexpected weight change.  HENT: Negative for ear pain, nosebleeds, congestion, sore throat, rhinorrhea, sneezing, trouble swallowing, dental problem, postnasal drip and sinus pressure.   Eyes: Negative for redness and itching.  Respiratory: Positive for shortness of breath. Negative for cough, chest tightness and wheezing.   Cardiovascular: Negative for palpitations and leg swelling.  Gastrointestinal: Negative for nausea and vomiting.  Genitourinary: Negative for dysuria.  Musculoskeletal: Negative for joint swelling.  Skin: Negative for rash.  Neurological: Negative for headaches.  Hematological: Does not bruise/bleed easily.  Psychiatric/Behavioral: Negative for dysphoric mood. The patient is nervous/anxious ( panic attacks ).        Objective:   Physical Exam Obese female in no acute distress Nose with purulent discharge noted No skin breakdown or pressure necrosis from the CPAP mask Neck without lymphadenopathy or thyromegaly Lower extremities without edema, no cyanosis Alert and oriented, moves all 4 extremities.       Assessment & Plan:

## 2013-06-08 NOTE — Assessment & Plan Note (Signed)
The patient has a history of mild obstructive sleep apnea, and has been doing well with CPAP overall.  She is having trouble with her mask fit, and I would like to change her to nasal pillows.  She has never had any issues with mouth opening.  I have also encouraged her to work aggressively on weight loss.  She will followup in one year doing well.

## 2013-06-08 NOTE — Patient Instructions (Addendum)
Will send an order to [facility] for new nasal pillows Keep working on weight loss followup with me in one year, but call if having issues with cpap.

## 2013-07-05 ENCOUNTER — Ambulatory Visit (AMBULATORY_SURGERY_CENTER): Payer: BC Managed Care – PPO | Admitting: *Deleted

## 2013-07-05 ENCOUNTER — Encounter: Payer: Self-pay | Admitting: Gastroenterology

## 2013-07-05 VITALS — Ht 66.0 in | Wt 241.8 lb

## 2013-07-05 DIAGNOSIS — Z1211 Encounter for screening for malignant neoplasm of colon: Secondary | ICD-10-CM

## 2013-07-05 MED ORDER — MOVIPREP 100 G PO SOLR: 1.0000 | Freq: Once | ORAL | Status: DC

## 2013-07-05 NOTE — Progress Notes (Signed)
Denies allergies to eggs or soy products. Denies complications with sedation or anesthesia. 

## 2013-07-23 ENCOUNTER — Encounter: Payer: Self-pay | Admitting: Gastroenterology

## 2013-07-23 ENCOUNTER — Ambulatory Visit (AMBULATORY_SURGERY_CENTER): Payer: BC Managed Care – PPO | Admitting: Gastroenterology

## 2013-07-23 VITALS — BP 164/76 | HR 60 | Temp 97.5°F | Resp 21 | Ht 66.0 in | Wt 241.0 lb

## 2013-07-23 DIAGNOSIS — Z800 Family history of malignant neoplasm of digestive organs: Secondary | ICD-10-CM

## 2013-07-23 DIAGNOSIS — Z1211 Encounter for screening for malignant neoplasm of colon: Secondary | ICD-10-CM

## 2013-07-23 DIAGNOSIS — K5730 Diverticulosis of large intestine without perforation or abscess without bleeding: Secondary | ICD-10-CM

## 2013-07-23 MED ORDER — SODIUM CHLORIDE 0.9 % IV SOLN: 500.0000 mL | INTRAVENOUS | Status: DC

## 2013-07-23 NOTE — Progress Notes (Signed)
NO EGG OR SOY PRODUCTS/ EWM

## 2013-07-23 NOTE — Progress Notes (Signed)
Patient did not experience any of the following events: a burn prior to discharge; a fall within the facility; wrong site/side/patient/procedure/implant event; or a hospital transfer or hospital admission upon discharge from the facility. (G8907) Patient did not have preoperative order for IV antibiotic SSI prophylaxis. (G8918)  

## 2013-07-23 NOTE — Progress Notes (Signed)
Procedure ends, to recovery, report given and VSS. 

## 2013-07-23 NOTE — Patient Instructions (Addendum)
Discharge instructions given with verbal understanding. Handouts on diverticulosis and a high fiber diet given. Resume previous medications.YOU HAD AN ENDOSCOPIC PROCEDURE TODAY AT THE [facility]: Refer to the procedure report that was given to you for any specific questions about what was found during the examination.  If the procedure report does not answer your questions, please call your gastroenterologist to clarify.  If you requested that your care partner not be given the details of your procedure findings, then the procedure report has been included in a sealed envelope for you to review at your convenience later.  YOU SHOULD EXPECT: Some feelings of bloating in the abdomen. Passage of more gas than usual.  Walking can help get rid of the air that was put into your GI tract during the procedure and reduce the bloating. If you had a lower endoscopy (such as a colonoscopy or flexible sigmoidoscopy) you may notice spotting of blood in your stool or on the toilet paper. If you underwent a bowel prep for your procedure, then you may not have a normal bowel movement for a few days.  DIET: Your first meal following the procedure should be a light meal and then it is ok to progress to your normal diet.  A half-sandwich or bowl of soup is an example of a good first meal.  Heavy or fried foods are harder to digest and may make you feel nauseous or bloated.  Likewise meals heavy in dairy and vegetables can cause extra gas to form and this can also increase the bloating.  Drink plenty of fluids but you should avoid alcoholic beverages for 24 hours.  ACTIVITY: Your care partner should take you home directly after the procedure.  You should plan to take it easy, moving slowly for the rest of the day.  You can resume normal activity the day after the procedure however you should NOT DRIVE or use heavy machinery for 24 hours (because of the sedation medicines used during the test).    SYMPTOMS TO  REPORT IMMEDIATELY: A gastroenterologist can be reached at any hour.  During normal business hours, 8:30 AM to 5:00 PM Monday through Friday, call [phone].  After hours and on weekends, please call the GI answering service at [phone] who will take a message and have the physician on call contact you.   Following lower endoscopy (colonoscopy or flexible sigmoidoscopy):  Excessive amounts of blood in the stool  Significant tenderness or worsening of abdominal pains  Swelling of the abdomen that is new, acute  Fever of 100F or higher  FOLLOW UP: If any biopsies were taken you will be contacted by phone or by letter within the next 1-3 weeks.  Call your gastroenterologist if you have not heard about the biopsies in 3 weeks.  Our staff will call the home number listed on your records the next business day following your procedure to check on you and address any questions or concerns that you may have at that time regarding the information given to you following your procedure. This is a courtesy call and so if there is no answer at the home number and we have not heard from you through the emergency physician on call, we will assume that you have returned to your regular daily activities without incident.  SIGNATURES/CONFIDENTIALITY: You and/or your care partner have signed paperwork which will be entered into your electronic medical record.  These signatures attest to the fact that that the information above on your   After Visit Summary has been reviewed and is understood.  Full responsibility of the confidentiality of this discharge information lies with you and/or your care-partner.   

## 2013-07-23 NOTE — Op Note (Signed)
[facility] [address] [place], [address]   COLONOSCOPY PROCEDURE REPORT  PATIENT: [name] [name], [name] J.  MR#: [id number] BIRTHDATE: 07-18-1961 , 52  yrs. old GENDER: Female ENDOSCOPIST: Mardella Layman, MD, Clementeen Graham REFERRED BY:  Karleen Hampshire, M.D. PROCEDURE DATE:  07/23/2013 PROCEDURE:   Colonoscopy, screening ASA CLASS:   Class II INDICATIONS:average risk screening. ..Marland KitchenFH of colon polyps MEDICATIONS: Propofol (Diprivan) 170 mg IV  DESCRIPTION OF PROCEDURE:   After the risks and benefits and of the procedure were explained, informed consent was obtained.  A digital rectal exam revealed no abnormalities of the rectum.    The [id number]  endoscope was introduced through the anus and advanced to the cecum, which was identified by both the appendix and ileocecal valve .  The quality of the prep was excellent, using MoviPrep .  The instrument was then slowly withdrawn as the colon was fully examined.     COLON FINDINGS: Mild diverticulosis was noted in the sigmoid colon. The colon was otherwise normal.  There was no diverticulosis, inflammation, polyps or cancers unless previously stated. Retroflexed views revealed no abnormalities.     The scope was then withdrawn from the patient and the procedure completed.  COMPLICATIONS: There were no complications. ENDOSCOPIC IMPRESSION: 1.   Mild diverticulosis was noted in the sigmoid colon ..early tics,small and scattered 2.   The colon was otherwise normal ..no polyps noted  RECOMMENDATIONS: 1.  Continue current medications 2.  Given your significant family history of colon polyps, you should have a repeat colonoscopy in 5 years. 3. High fiber diet  REPEAT EXAM:  cc:  _______________________________ eSignedMardella Layman, MD, [facility] 07/23/2013 10:13 AM

## 2013-07-26 ENCOUNTER — Telehealth: Payer: Self-pay

## 2013-07-26 NOTE — Telephone Encounter (Signed)
  Follow up Call-  Call back number 07/23/2013  Post procedure Call Back phone  # [phone]  Permission to leave phone message Yes     Patient questions:  Do you have a fever, pain , or abdominal swelling? no Pain Score  0 *  Have you tolerated food without any problems? yes  Have you been able to return to your normal activities? yes  Do you have any questions about your discharge instructions: Diet   no Medications  no Follow up visit  no  Do you have questions or concerns about your Care? no  Actions: * If pain score is 4 or above: No action needed, pain <4.

## 2013-10-08 ENCOUNTER — Other Ambulatory Visit: Payer: Self-pay | Admitting: Adult Health

## 2013-11-07 ENCOUNTER — Other Ambulatory Visit: Payer: Self-pay | Admitting: Adult Health

## 2013-12-30 HISTORY — PX: KNEE ARTHROSCOPY: SHX127

## 2014-01-30 ENCOUNTER — Other Ambulatory Visit: Payer: Self-pay | Admitting: Adult Health

## 2014-03-30 ENCOUNTER — Other Ambulatory Visit: Payer: Self-pay | Admitting: Adult Health

## 2014-03-30 DIAGNOSIS — Z139 Encounter for screening, unspecified: Secondary | ICD-10-CM

## 2014-04-18 ENCOUNTER — Encounter: Payer: Self-pay | Admitting: Adult Health

## 2014-04-18 ENCOUNTER — Ambulatory Visit (HOSPITAL_COMMUNITY)
Admission: RE | Admit: 2014-04-18 | Discharge: 2014-04-18 | Disposition: A | Discharge status: Home / Self Care | Payer: BC Managed Care – PPO | Source: Ambulatory Visit | Attending: Adult Health | Admitting: Adult Health

## 2014-04-18 ENCOUNTER — Other Ambulatory Visit (HOSPITAL_COMMUNITY)
Admission: RE | Admit: 2014-04-18 | Discharge: 2014-04-18 | Disposition: A | Discharge status: Home / Self Care | Payer: BC Managed Care – PPO | Source: Ambulatory Visit | Attending: Adult Health | Admitting: Adult Health

## 2014-04-18 ENCOUNTER — Ambulatory Visit (INDEPENDENT_AMBULATORY_CARE_PROVIDER_SITE_OTHER): Payer: BC Managed Care – PPO | Admitting: Adult Health

## 2014-04-18 VITALS — BP 130/88 | HR 76 | Ht 66.0 in | Wt 242.0 lb

## 2014-04-18 DIAGNOSIS — Z01419 Encounter for gynecological examination (general) (routine) without abnormal findings: Secondary | ICD-10-CM | POA: Insufficient documentation

## 2014-04-18 DIAGNOSIS — Z1231 Encounter for screening mammogram for malignant neoplasm of breast: Secondary | ICD-10-CM | POA: Insufficient documentation

## 2014-04-18 DIAGNOSIS — F32A Depression, unspecified: Secondary | ICD-10-CM

## 2014-04-18 DIAGNOSIS — Z1212 Encounter for screening for malignant neoplasm of rectum: Secondary | ICD-10-CM

## 2014-04-18 DIAGNOSIS — Z1151 Encounter for screening for human papillomavirus (HPV): Secondary | ICD-10-CM | POA: Insufficient documentation

## 2014-04-18 DIAGNOSIS — I10 Essential (primary) hypertension: Secondary | ICD-10-CM

## 2014-04-18 DIAGNOSIS — F419 Anxiety disorder, unspecified: Secondary | ICD-10-CM

## 2014-04-18 DIAGNOSIS — F329 Major depressive disorder, single episode, unspecified: Secondary | ICD-10-CM

## 2014-04-18 DIAGNOSIS — Z139 Encounter for screening, unspecified: Secondary | ICD-10-CM

## 2014-04-18 LAB — HEMOCCULT GUIAC POC 1CARD (OFFICE): Fecal Occult Blood, POC: NEGATIVE

## 2014-04-18 NOTE — Progress Notes (Signed)
Patient ID: [name] [name], female   DOB: 05-19-1961, 53 y.o.   MRN: [id number] History of Present Illness: [name] [name] is a 53 year old white female, married in for a pap and physical.She is retired now and is still going to counseling and is on meds.and she is much better.Had mammogram today.Has not used provera as per Dr Despina HiddenEure, but has not had any spotting.Had colonoscopy this year.   Current Medications, Allergies, Past Medical History, Past Surgical History, Family History and Social History were reviewed in [facility] electronic medical record.   Past Medical History  Diagnosis Date  . Essential hypertension, benign   . Incompetent cervix   . Abnormal Pap smear   . Anxiety   . Mental disorder   . Depression 04/15/2013  . OSA on CPAP   . Bipolar disorder   . PTSD (post-traumatic stress disorder)   . Adult ADHD   . GAD (generalized anxiety disorder)   . Obesity    Past Surgical History  Procedure Laterality Date  . Tonsillectomy    . Dilation and curettage of uterus    . Cervical cerclage    Current outpatient prescriptions:ALPRAZolam (XANAX) 0.25 MG tablet, Take 0.25 mg by mouth at bedtime as needed for sleep., Disp: , Rfl: ;  atomoxetine (STRATTERA) 25 MG capsule, Take 25 mg by mouth daily., Disp: , Rfl: ;  escitalopram (LEXAPRO) 20 MG tablet, TAKE 1 TABLET BY MOUTH EVERY DAY, Disp: 30 tablet, Rfl: 11;  fish oil-omega-3 fatty acids 1000 MG capsule, Take 2 g by mouth daily., Disp: , Rfl:  hydrochlorothiazide (HYDRODIURIL) 25 MG tablet, TAKE 1 TABLET BY MOUTH EVERY DAY, Disp: 90 tablet, Rfl: 0;  lamoTRIgine (LAMICTAL) 100 MG tablet, Take 200 mg by mouth daily. , Disp: , Rfl:   Review of Systems: Patient denies any headaches, blurred vision, shortness of breath, chest pain, abdominal pain, problems with bowel movements, urination, or intercourse. No joint swelling, she not happy with her weight but is working on it.    Physical Exam:BP 130/88  Pulse 76  Ht 5\' 6"  (1.676 m)  Wt 242 lb  (109.77 kg)  BMI 39.08 kg/m2 General:  Well developed, well nourished, no acute distress Skin:  Warm and dry Neck:  Midline trachea, normal thyroid Lungs; Clear to auscultation bilaterally Breast:  No dominant palpable mass, retraction, or nipple discharge Cardiovascular: Regular rate and rhythm Abdomen:  Soft, non tender, no hepatosplenomegaly Pelvic:  External genitalia is normal in appearance.  The vagina is normal in appearance. The cervix is bulbous. Pap  With HPV performed. Uterus is felt to be normal size, shape, and contour.  No                adnexal masses or tenderness noted. Rectal: Good sphincter tone, no polyps, has hemorrhoids.  Hemoccult negative. Extremities:  No swelling or varicosities noted Psych:  Alert and cooperative,seems happy   Impression: Yearly gyn exam Anxiety Depression Hypertension     Plan: Physical in 1 year Mammogram yearly Return in 1 week for  US and labs Colonoscopy in 2020

## 2014-04-18 NOTE — Patient Instructions (Signed)
Physical in 1 year Mammogram yearly Colonoscopy advised 2020 Return in 1 week for US and labs

## 2014-04-25 ENCOUNTER — Other Ambulatory Visit: Payer: BC Managed Care – PPO

## 2014-04-25 ENCOUNTER — Ambulatory Visit: Payer: BC Managed Care – PPO | Admitting: Adult Health

## 2014-04-26 ENCOUNTER — Ambulatory Visit: Payer: BC Managed Care – PPO | Admitting: Adult Health

## 2014-04-28 ENCOUNTER — Other Ambulatory Visit: Payer: Self-pay | Admitting: Adult Health

## 2014-04-28 DIAGNOSIS — Z780 Asymptomatic menopausal state: Secondary | ICD-10-CM

## 2014-05-02 ENCOUNTER — Ambulatory Visit (INDEPENDENT_AMBULATORY_CARE_PROVIDER_SITE_OTHER): Payer: BC Managed Care – PPO | Admitting: Adult Health

## 2014-05-02 ENCOUNTER — Encounter: Payer: Self-pay | Admitting: Adult Health

## 2014-05-02 ENCOUNTER — Ambulatory Visit (INDEPENDENT_AMBULATORY_CARE_PROVIDER_SITE_OTHER): Payer: BC Managed Care – PPO

## 2014-05-02 ENCOUNTER — Other Ambulatory Visit: Payer: BC Managed Care – PPO

## 2014-05-02 ENCOUNTER — Other Ambulatory Visit: Payer: Self-pay | Admitting: Adult Health

## 2014-05-02 VITALS — BP 120/82 | Ht 66.0 in | Wt 231.0 lb

## 2014-05-02 DIAGNOSIS — R9389 Abnormal findings on diagnostic imaging of other specified body structures: Secondary | ICD-10-CM | POA: Insufficient documentation

## 2014-05-02 DIAGNOSIS — Z0000 Encounter for general adult medical examination without abnormal findings: Secondary | ICD-10-CM

## 2014-05-02 DIAGNOSIS — Z780 Asymptomatic menopausal state: Secondary | ICD-10-CM

## 2014-05-02 DIAGNOSIS — Z13220 Encounter for screening for lipoid disorders: Secondary | ICD-10-CM

## 2014-05-02 DIAGNOSIS — Z1329 Encounter for screening for other suspected endocrine disorder: Secondary | ICD-10-CM

## 2014-05-02 HISTORY — DX: Abnormal findings on diagnostic imaging of other specified body structures: R93.89

## 2014-05-02 LAB — LIPID PANEL
Cholesterol: 208 mg/dL — ABNORMAL HIGH (ref 0–200)
HDL: 41 mg/dL (ref >39)
LDL Cholesterol: 135 mg/dL — ABNORMAL HIGH (ref 0–99)
TRIGLYCERIDES: 162 mg/dL — AB (ref <150)
Total CHOL/HDL Ratio: 5.1 Ratio
VLDL: 32 mg/dL (ref 0–40)

## 2014-05-02 LAB — COMPREHENSIVE METABOLIC PANEL
ALBUMIN: 4.7 g/dL (ref 3.5–5.2)
ALT: 26 U/L (ref 0–35)
AST: 22 U/L (ref 0–37)
Alkaline Phosphatase: 106 U/L (ref 39–117)
BILIRUBIN TOTAL: 0.5 mg/dL (ref 0.2–1.2)
BUN: 15 mg/dL (ref 6–23)
CO2: 28 mEq/L (ref 19–32)
Calcium: 10 mg/dL (ref 8.4–10.5)
Chloride: 101 mEq/L (ref 96–112)
Creat: 0.95 mg/dL (ref 0.50–1.10)
Glucose, Bld: 110 mg/dL — ABNORMAL HIGH (ref 70–99)
Potassium: 4.6 mEq/L (ref 3.5–5.3)
SODIUM: 139 meq/L (ref 135–145)
Total Protein: 7.4 g/dL (ref 6.0–8.3)

## 2014-05-02 LAB — CBC
HCT: 42.5 % (ref 36.0–46.0)
Hemoglobin: 14.9 g/dL (ref 12.0–15.0)
MCH: 32 pg (ref 26.0–34.0)
MCHC: 35.1 g/dL (ref 30.0–36.0)
MCV: 91.2 fL (ref 78.0–100.0)
PLATELETS: 353 10*3/uL (ref 150–400)
RBC: 4.66 MIL/uL (ref 3.87–5.11)
RDW: 13.7 % (ref 11.5–15.5)
WBC: 7 10*3/uL (ref 4.0–10.5)

## 2014-05-02 LAB — TSH: TSH: 3.641 u[IU]/mL (ref 0.350–4.500)

## 2014-05-02 MED ORDER — MEDROXYPROGESTERONE ACETATE 10 MG PO TABS: ORAL_TABLET | ORAL | Status: DC

## 2014-05-02 NOTE — Patient Instructions (Signed)
Take provera 10 mg x 10 days each month x 4 months then follow up in 4 months for [place]

## 2014-05-02 NOTE — Progress Notes (Signed)
Subjective:     Patient ID: [name] [name], female   DOB: [birth year], 53 y.o.   MRN: [id number]  HPI [name] is a 53 year old white female, in for US and labs.Pt has had PMB in past and was supposed to take provera and forgot needed,to assess endometrium,declines ever having biopsy again.  Review of Systems See HPI Reviewed past medical,surgical, social and family history. Reviewed medications and allergies.     Objective:   Physical Exam BP 120/82  Ht 5\' 6"  (1.676 m)  Wt 231 lb (104.781 kg)  BMI 37.30 kg/m2Reviewed US with pt and discussed with Dr Despina HiddenEure,    Uterus 7.1 x 4.5 x 3.7 cm, no myometrial masses noted within  Endometrium 8.0 mm, symmetrical, no obvious mass noted within  Right ovary 2.1 x 1.4 x 1.1 cm,  Left ovary 1.6 x 1.2 x 1.2 cm,  No free fluid or adnexal masses noted  Technician Comments:  No myometrial masses noted, Endom-8.610mm symmetrical, bilateral adnexa/ovaries appear WNL no free fluid or adnexal masses noted within pelvis  Will rx provera 10 mg x 10 days per month x 4 months, per Dr Forestine ChuteEure's recommendation.   Assessment:    Thickened endometrium    Plan:    Check fasting labs,CBC,CMP,TSH and lipids Rx provera 10 mg X 10 days x 4 months Follow up in 4 months for US and see me Call prn

## 2014-05-03 LAB — HEMOGLOBIN A1C
Hgb A1c MFr Bld: 6 % — ABNORMAL HIGH (ref <5.7)
Mean Plasma Glucose: 126 mg/dL — ABNORMAL HIGH (ref <117)

## 2014-05-04 ENCOUNTER — Telehealth: Payer: Self-pay | Admitting: Adult Health

## 2014-05-04 ENCOUNTER — Telehealth: Payer: Self-pay

## 2014-05-04 NOTE — Telephone Encounter (Signed)
Pt aware of labs and need ot increase activity and decrease carbs will recheck labs in 3-4 months

## 2014-05-04 NOTE — Telephone Encounter (Signed)
Left message to call about labs 

## 2014-08-26 ENCOUNTER — Other Ambulatory Visit: Payer: Self-pay | Admitting: Adult Health

## 2014-08-26 DIAGNOSIS — R9389 Abnormal findings on diagnostic imaging of other specified body structures: Secondary | ICD-10-CM

## 2014-08-31 ENCOUNTER — Encounter: Payer: Self-pay | Admitting: Adult Health

## 2014-08-31 ENCOUNTER — Ambulatory Visit (INDEPENDENT_AMBULATORY_CARE_PROVIDER_SITE_OTHER): Payer: BC Managed Care – PPO

## 2014-08-31 ENCOUNTER — Ambulatory Visit (INDEPENDENT_AMBULATORY_CARE_PROVIDER_SITE_OTHER): Payer: BC Managed Care – PPO | Admitting: Adult Health

## 2014-08-31 VITALS — BP 180/88 | Ht 66.0 in | Wt 217.5 lb

## 2014-08-31 DIAGNOSIS — R7309 Other abnormal glucose: Secondary | ICD-10-CM

## 2014-08-31 DIAGNOSIS — I10 Essential (primary) hypertension: Secondary | ICD-10-CM

## 2014-08-31 DIAGNOSIS — R9389 Abnormal findings on diagnostic imaging of other specified body structures: Secondary | ICD-10-CM

## 2014-08-31 LAB — CBC
HCT: 40 % (ref 36.0–46.0)
Hemoglobin: 13.6 g/dL (ref 12.0–15.0)
MCH: 31.6 pg (ref 26.0–34.0)
MCHC: 34 g/dL (ref 30.0–36.0)
MCV: 93 fL (ref 78.0–100.0)
PLATELETS: 336 10*3/uL (ref 150–400)
RBC: 4.3 MIL/uL (ref 3.87–5.11)
RDW: 14.1 % (ref 11.5–15.5)
WBC: 6.1 10*3/uL (ref 4.0–10.5)

## 2014-08-31 LAB — LIPID PANEL
CHOL/HDL RATIO: 4.9 ratio
CHOLESTEROL: 207 mg/dL — AB (ref 0–200)
HDL: 42 mg/dL (ref >39)
LDL Cholesterol: 137 mg/dL — ABNORMAL HIGH (ref 0–99)
Triglycerides: 138 mg/dL (ref <150)
VLDL: 28 mg/dL (ref 0–40)

## 2014-08-31 LAB — COMPREHENSIVE METABOLIC PANEL
ALK PHOS: 111 U/L (ref 39–117)
ALT: 27 U/L (ref 0–35)
AST: 23 U/L (ref 0–37)
Albumin: 4.3 g/dL (ref 3.5–5.2)
BILIRUBIN TOTAL: 0.6 mg/dL (ref 0.2–1.2)
BUN: 13 mg/dL (ref 6–23)
CO2: 28 mEq/L (ref 19–32)
Calcium: 9.5 mg/dL (ref 8.4–10.5)
Chloride: 104 mEq/L (ref 96–112)
Creat: 0.92 mg/dL (ref 0.50–1.10)
Glucose, Bld: 97 mg/dL (ref 70–99)
Potassium: 4.1 mEq/L (ref 3.5–5.3)
Sodium: 139 mEq/L (ref 135–145)
Total Protein: 6.7 g/dL (ref 6.0–8.3)

## 2014-08-31 LAB — HEMOGLOBIN A1C
HEMOGLOBIN A1C: 6 % — AB (ref <5.7)
MEAN PLASMA GLUCOSE: 126 mg/dL — AB (ref <117)

## 2014-08-31 MED ORDER — MEDROXYPROGESTERONE ACETATE 10 MG PO TABS: ORAL_TABLET | ORAL | Status: DC

## 2014-08-31 NOTE — Patient Instructions (Signed)
Take provera 10 days every month Follow up in 4 months for [place]

## 2014-08-31 NOTE — Progress Notes (Signed)
Subjective:     Patient ID: [name] [name], female   DOB: 10/17/1961, 53 y.o.   MRN: [id number]  HPI [name] [name] is a 53 year old white female with slightly thickened endometrium and has been taking provera for 3 months, some spotting.Has lost 24 lbs.She is having lap knee surgery on right 09/15/14.She is having good and bad days, seems mostly good.Her daughter is pregnant and she is happy.  Review of Systems See HPI Reviewed past medical,surgical, social and family history. Reviewed medications and allergies.     Objective:   Physical Exam BP 180/88  Ht  (1.676 m)  Wt 217 lb 8 oz (98.657 kg)  BMI 35.12 kg/m2Reviewed [place] with pt.   Uterus Anteverted  Endometrium 5.4 mm, symmetrical,  Right ovary Appears WNL 2.0cm  Left ovary Appears WNL 1.8 cm  No free fluid or adnexal masses noted within the pelvis noted  Technician Comments:  Anteverted uterus, ENDOM=5.39mm symmetrical (slight decrease in thickness since previous U/S in May 2015), bilateral adnexa/ovaries appear WNL, discussed with Dr Emelda Fear. Will continue provera 10 mg for 4 more months and recheck [place].   Assessment:     Thickened endometrium Elevated A1c HTN    Plan:     Refilled provera 10 mg #30 1 daily x 10 days per month Check CBC,CMP,TSH and lipids and A1c, will talk next week Return in 4 months for [place] and see me

## 2014-09-01 LAB — TSH: TSH: 1.654 u[IU]/mL (ref 0.350–4.500)

## 2014-09-06 ENCOUNTER — Telehealth: Payer: Self-pay | Admitting: Adult Health

## 2014-09-06 NOTE — Telephone Encounter (Signed)
Left message about labs

## 2014-09-12 ENCOUNTER — Ambulatory Visit (INDEPENDENT_AMBULATORY_CARE_PROVIDER_SITE_OTHER): Payer: BC Managed Care – PPO | Admitting: Pulmonary Disease

## 2014-09-12 ENCOUNTER — Encounter: Payer: Self-pay | Admitting: Pulmonary Disease

## 2014-09-12 VITALS — BP 132/78 | HR 85 | Temp 98.1°F | Ht 66.0 in | Wt 218.8 lb

## 2014-09-12 DIAGNOSIS — G4733 Obstructive sleep apnea (adult) (pediatric): Secondary | ICD-10-CM

## 2014-09-12 NOTE — Patient Instructions (Signed)
Stay on cpap, and keep up with your mask changes and supplies. Keep working on weight loss, you are doing great. followup with me again in one year.

## 2014-09-12 NOTE — Assessment & Plan Note (Signed)
The patient continues to do well with CPAP, and has lost 30 pounds since the last visit. I have encouraged her to continue doing so, and to keep up with her mask changes and supplies. I will see her back in one year if doing well.

## 2014-09-12 NOTE — Progress Notes (Signed)
   Subjective:    Patient ID: [name] [name], female    DOB: [birth year], 53 y.o.   MRN: [id number]  HPI The patient comes in today for followup of her obstructive sleep apnea. She is wearing the device compliantly, and is having no issues with mask fit or pressure. She has lost 30 pounds since last visit, and I have commended her on this. She feels that she is sleeping well, and is satisfied with her daytime alertness.   Review of Systems  Constitutional: Negative for fever and unexpected weight change.  HENT: Negative for congestion, dental problem, ear pain, nosebleeds, postnasal drip, rhinorrhea, sinus pressure, sneezing, sore throat and trouble swallowing.   Eyes: Negative for redness and itching.  Respiratory: Negative for cough, chest tightness, shortness of breath and wheezing.   Cardiovascular: Negative for palpitations and leg swelling.  Gastrointestinal: Negative for nausea and vomiting.  Genitourinary: Negative for dysuria.  Musculoskeletal: Negative for joint swelling.  Skin: Negative for rash.  Neurological: Negative for headaches.  Hematological: Does not bruise/bleed easily.  Psychiatric/Behavioral: Negative for dysphoric mood. The patient is not nervous/anxious.        Objective:   Physical Exam Overweight female in no acute distress Nose without purulence or discharge noted No skin breakdown or pressure necrosis from the CPAP Neck without lymphadenopathy or thyromegaly Lower extremities without edema, no cyanosis Alert, does not appear to be sleepy, moves all 4 extremities.       Assessment & Plan:

## 2014-10-31 ENCOUNTER — Encounter: Payer: Self-pay | Admitting: Pulmonary Disease

## 2014-12-04 ENCOUNTER — Other Ambulatory Visit: Payer: Self-pay | Admitting: Adult Health

## 2015-01-02 ENCOUNTER — Ambulatory Visit: Payer: BC Managed Care – PPO | Admitting: Adult Health

## 2015-01-02 ENCOUNTER — Other Ambulatory Visit: Payer: BC Managed Care – PPO

## 2015-01-02 ENCOUNTER — Encounter: Payer: Self-pay | Admitting: Adult Health

## 2015-01-10 ENCOUNTER — Other Ambulatory Visit: Payer: Self-pay

## 2015-01-10 ENCOUNTER — Ambulatory Visit: Payer: Self-pay | Admitting: Adult Health

## 2015-01-18 ENCOUNTER — Ambulatory Visit (INDEPENDENT_AMBULATORY_CARE_PROVIDER_SITE_OTHER): Payer: BLUE CROSS/BLUE SHIELD | Admitting: Adult Health

## 2015-01-18 ENCOUNTER — Ambulatory Visit (INDEPENDENT_AMBULATORY_CARE_PROVIDER_SITE_OTHER): Payer: BLUE CROSS/BLUE SHIELD

## 2015-01-18 ENCOUNTER — Other Ambulatory Visit: Payer: Self-pay | Admitting: Adult Health

## 2015-01-18 ENCOUNTER — Encounter: Payer: Self-pay | Admitting: Adult Health

## 2015-01-18 VITALS — BP 160/82 | Ht 66.0 in | Wt 214.0 lb

## 2015-01-18 DIAGNOSIS — R9389 Abnormal findings on diagnostic imaging of other specified body structures: Secondary | ICD-10-CM

## 2015-01-18 DIAGNOSIS — Z113 Encounter for screening for infections with a predominantly sexual mode of transmission: Secondary | ICD-10-CM

## 2015-01-18 DIAGNOSIS — R938 Abnormal findings on diagnostic imaging of other specified body structures: Secondary | ICD-10-CM

## 2015-01-18 MED ORDER — MEDROXYPROGESTERONE ACETATE 10 MG PO TABS: ORAL_TABLET | ORAL | Status: DC

## 2015-01-18 NOTE — Progress Notes (Signed)
Subjective:     Patient ID: [name] [name], female   DOB: [birth year], 54 y.o.   MRN: [id number]  HPI [name] is a 54 year old white female in for US for thickened endometrium, was 5.4 in September and 8.0 in May, has used provera with no bleeding.Had biopsy in past, and it was normal,she says will not do that again.Husband cheated and they are separated, wants STD testing.Says she is doing OK.  Review of Systems See HPI Reviewed past medical,surgical, social and family history. Reviewed medications and allergies.     Objective:   Physical Exam BP 160/82 mmHg  Ht 5\' 6"  (1.676 m)  Wt 214 lb (97.07 kg)  BMI 34.56 kg/m2 US reviewed with pt.   Uterus Anteverted   Endometrium 6.1 mm, no obvious mass noted  Right ovary Appears WNL  Left ovary Appears WNL  No free fluid or adnexal masses noted within the pelvis  Technician Comments:  Anteverted uterus, Endom-6.541mm, bilateral adnexa appears WNL Discussed with dr Emelda FearFerguson, will try provera for 14 days for next 3 months and re US in 3 months.  Assessment:     Thickened endometrium STD screening    Plan:     Check GC/CHL,HIV,RPR and HSV2 Return in 3 months for gyn US and see me Rx provera 10 mg #42 take 1 daily for first 14 days for next 3 months

## 2015-01-18 NOTE — Patient Instructions (Signed)
Take provera daily for 14 weeks next 3 months Return in 3 months for US and see me

## 2015-01-19 ENCOUNTER — Telehealth: Payer: Self-pay | Admitting: Adult Health

## 2015-01-19 LAB — HSV 2 ANTIBODY, IGG: HSV 2 Glycoprotein G Ab, IgG: <0.1 IV

## 2015-01-19 LAB — GC/CHLAMYDIA PROBE AMP
CT Probe RNA: NEGATIVE
GC PROBE AMP APTIMA: NEGATIVE

## 2015-01-19 LAB — RPR

## 2015-01-19 LAB — HIV ANTIBODY (ROUTINE TESTING W REFLEX): HIV 1&2 Ab, 4th Generation: NONREACTIVE

## 2015-01-19 NOTE — Telephone Encounter (Signed)
Pt aware tests negative so far awaiting HSV

## 2015-01-19 NOTE — Telephone Encounter (Signed)
Left message to call.

## 2015-04-18 ENCOUNTER — Other Ambulatory Visit: Payer: Self-pay | Admitting: Adult Health

## 2015-04-18 DIAGNOSIS — R9389 Abnormal findings on diagnostic imaging of other specified body structures: Secondary | ICD-10-CM

## 2015-04-19 ENCOUNTER — Encounter: Payer: Self-pay | Admitting: Adult Health

## 2015-04-19 ENCOUNTER — Ambulatory Visit (INDEPENDENT_AMBULATORY_CARE_PROVIDER_SITE_OTHER): Payer: BLUE CROSS/BLUE SHIELD | Admitting: Adult Health

## 2015-04-19 ENCOUNTER — Ambulatory Visit (INDEPENDENT_AMBULATORY_CARE_PROVIDER_SITE_OTHER): Payer: BLUE CROSS/BLUE SHIELD

## 2015-04-19 VITALS — BP 158/70 | HR 76 | Ht 66.0 in | Wt 216.0 lb

## 2015-04-19 DIAGNOSIS — R9389 Abnormal findings on diagnostic imaging of other specified body structures: Secondary | ICD-10-CM

## 2015-04-19 DIAGNOSIS — R938 Abnormal findings on diagnostic imaging of other specified body structures: Secondary | ICD-10-CM

## 2015-04-19 NOTE — Patient Instructions (Signed)
Return in 1 week to talk with Dr Despina HiddenEure

## 2015-04-19 NOTE — Progress Notes (Signed)
US pelvis normal anteverted uterus, EEC 7.8 mm (previously 6.661mm),normal ov's bilat,no free fluid

## 2015-04-19 NOTE — Progress Notes (Signed)
Subjective:     Patient ID: [name] [name], female   DOB: March 16, 1961, 54 y.o.   MRN: [id number]  HPI [name] [name] is a 54 year old white female who has had thickened endometrium in th past and with last measurement 6.1 mm 01/18/15 and she started taking provera 10 mg for 14 days each month and has not had any bleeding and she had US today with increase in endometrium to 7.8 mm.  Review of Systems Patient denies any headaches, hearing loss, fatigue, blurred vision, shortness of breath, chest pain, abdominal pain, problems with bowel movements, urination, or intercourse. No joint pain, moods better most days. Reviewed past medical,surgical, social and family history. Reviewed medications and allergies.     Objective:   Physical Exam BP 158/70 mmHg  Pulse 76  Ht 5\' 6"  (1.676 m)  Wt 216 lb (97.977 kg)  BMI 34.88 kg/m2 Reviewed US with pt.   Uterus 8.01 x 4.92 x 3.9 cm, wnl  Endometrium 7.8 mm, symmetrical, (previously 6.191mm)  Right ovary 1.9 x 2.0 x 1.2 cm, normal  Left ovary 1.2 x 2.1 x 2.1 cm, normal     Technician Comments: US pelvis, normal anteverted uterus, EEC 7.8 mm (previously 6.441mm),normal ov's bilat,no free fluid  Discussed getting endometrial biopsy and she declines, wants to just have a hysterectomy and we discussed supra cervical.She is teary today,she is separated and saw ex yesterday.Face time 10 minutes discussing options.   Assessment:     Thickened endometrium     Plan:     Return in 1 week to discuss surgery with Dr Despina HiddenEure

## 2015-04-27 ENCOUNTER — Encounter: Payer: Self-pay | Admitting: Obstetrics & Gynecology

## 2015-04-27 ENCOUNTER — Ambulatory Visit (INDEPENDENT_AMBULATORY_CARE_PROVIDER_SITE_OTHER): Payer: BLUE CROSS/BLUE SHIELD | Admitting: Obstetrics & Gynecology

## 2015-04-27 VITALS — BP 150/82 | HR 72 | Ht 66.0 in | Wt 215.0 lb

## 2015-04-27 DIAGNOSIS — R938 Abnormal findings on diagnostic imaging of other specified body structures: Secondary | ICD-10-CM

## 2015-04-27 DIAGNOSIS — R9389 Abnormal findings on diagnostic imaging of other specified body structures: Secondary | ICD-10-CM

## 2015-04-27 MED ORDER — MEDROXYPROGESTERONE ACETATE 10 MG PO TABS: ORAL_TABLET | ORAL | Status: DC

## 2015-04-27 NOTE — Progress Notes (Signed)
Patient ID: ADLEE PAAR, female   DOB: 19-Jun-1961, 54 y.o.   MRN: [id number] Chief Complaint  Patient presents with  . discuss Hysterectomy    Thickened Endometrium    Blood pressure 150/82, pulse 72, height  (1.676 m), weight 215 lb (97.523 kg).   See Victorino Dike Griffin's note below.  I reviewed this and sonogram and previous notes  Patient comes in and makes it very clear that she does not want to continue to have any endometrial biopsy from evaluation and management of her perimenopausal bleeding and thickened endometrium  I told patient that I had no intention of doing an endometrial biopsy today She had a normal one 2 years ago and nothing that is happening since then would suggest the need for another one  However she is obviously still producing some degree of estrogenic stimulation most likely may be a small amount of ovarian and also peripheral fat production I had gone into great detail with her about this 2 years ago and her bottom line interpretation of that routine discussion that I have frequently patients is "I told her she was fat" which is not the case but sometimes patients do distill thoughts down into source of terms  It was a rather contentious visit today with Lezlie Pointblank told her that I thought doing a hysterectomy was certainly much more intervention than was necessary  I do however recommend that she remained pain episodic cyclical Provera 10 mg for 10 days every 2-3 months to continue to suppress her endometrium I doing it with this frequency she will prevent getting endometrial hyperplasia and therefore endometrial cancer This does not work for her she has any other problems she can contact her office     Face to face time:  15 minutes  Greater than 50% of the visit time was spent in counseling and coordination of care with the patient.  The summary and outline of the counseling and care coordination is summarized in the note above.   All questions  were answered.   Subjective:     Patient ID: HENRY DEMERITT, female   DOB: [birth year], 54 y.o.   MRN: [id number]  HPI Narya is a 54 year old white female who has had thickened endometrium in th past and with last measurement 6.1 mm 01/18/15 and she started taking provera 10 mg for 14 days each month and has not had any bleeding and she had [place] today with increase in endometrium to 7.8 mm.  Review of Systems Patient denies any headaches, hearing loss, fatigue, blurred vision, shortness of breath, chest pain, abdominal pain, problems with bowel movements, urination, or intercourse. No joint pain, moods better most days. Reviewed past medical,surgical, social and family history. Reviewed medications and allergies.     Objective:   Physical Exam BP 150/82 mmHg  Pulse 72  Ht  (1.676 m)  Wt 215 lb (97.523 kg)  BMI 34.72 kg/m2 Reviewed [place] with pt.   Uterus 8.01 x 4.92 x 3.9 cm, wnl  Endometrium 7.8 mm, symmetrical, (previously 6.59mm)  Right ovary 1.9 x 2.0 x 1.2 cm, normal  Left ovary 1.2 x 2.1 x 2.1 cm, normal     Technician Comments: US pelvis, normal anteverted uterus, EEC 7.8 mm (previously 6.35mm),normal ov's bilat,no free fluid  Discussed getting endometrial biopsy and she declines, wants to just have a hysterectomy and we discussed supra cervical.She is teary today,she is separated and saw ex yesterday.Face time 10 minutes discussing options.   Assessment:  Thickened endometrium     Plan:     Return in 1 week to discuss surgery with Dr Despina HiddenEure

## 2015-05-11 ENCOUNTER — Observation Stay (HOSPITAL_COMMUNITY)
Admission: EM | Admit: 2015-05-11 | Discharge: 2015-05-13 | Disposition: A | Discharge status: Home / Self Care | Payer: BLUE CROSS/BLUE SHIELD | Attending: Internal Medicine | Admitting: Internal Medicine

## 2015-05-11 ENCOUNTER — Emergency Department (HOSPITAL_COMMUNITY): Payer: BLUE CROSS/BLUE SHIELD

## 2015-05-11 ENCOUNTER — Other Ambulatory Visit (HOSPITAL_COMMUNITY): Payer: Self-pay

## 2015-05-11 ENCOUNTER — Encounter (HOSPITAL_COMMUNITY): Payer: Self-pay

## 2015-05-11 DIAGNOSIS — Z87891 Personal history of nicotine dependence: Secondary | ICD-10-CM | POA: Diagnosis not present

## 2015-05-11 DIAGNOSIS — R112 Nausea with vomiting, unspecified: Secondary | ICD-10-CM | POA: Diagnosis not present

## 2015-05-11 DIAGNOSIS — Z79899 Other long term (current) drug therapy: Secondary | ICD-10-CM | POA: Insufficient documentation

## 2015-05-11 DIAGNOSIS — Z8742 Personal history of other diseases of the female genital tract: Secondary | ICD-10-CM | POA: Insufficient documentation

## 2015-05-11 DIAGNOSIS — R1013 Epigastric pain: Secondary | ICD-10-CM | POA: Insufficient documentation

## 2015-05-11 DIAGNOSIS — Z9981 Dependence on supplemental oxygen: Secondary | ICD-10-CM | POA: Insufficient documentation

## 2015-05-11 DIAGNOSIS — F319 Bipolar disorder, unspecified: Secondary | ICD-10-CM | POA: Insufficient documentation

## 2015-05-11 DIAGNOSIS — R7989 Other specified abnormal findings of blood chemistry: Secondary | ICD-10-CM | POA: Diagnosis not present

## 2015-05-11 DIAGNOSIS — R739 Hyperglycemia, unspecified: Secondary | ICD-10-CM | POA: Diagnosis present

## 2015-05-11 DIAGNOSIS — F411 Generalized anxiety disorder: Secondary | ICD-10-CM | POA: Diagnosis not present

## 2015-05-11 DIAGNOSIS — M1711 Unilateral primary osteoarthritis, right knee: Secondary | ICD-10-CM | POA: Insufficient documentation

## 2015-05-11 DIAGNOSIS — E669 Obesity, unspecified: Secondary | ICD-10-CM | POA: Diagnosis not present

## 2015-05-11 DIAGNOSIS — R945 Abnormal results of liver function studies: Secondary | ICD-10-CM

## 2015-05-11 DIAGNOSIS — Z720 Tobacco use: Secondary | ICD-10-CM | POA: Diagnosis present

## 2015-05-11 DIAGNOSIS — M549 Dorsalgia, unspecified: Secondary | ICD-10-CM | POA: Insufficient documentation

## 2015-05-11 DIAGNOSIS — G4733 Obstructive sleep apnea (adult) (pediatric): Secondary | ICD-10-CM | POA: Diagnosis not present

## 2015-05-11 DIAGNOSIS — R109 Unspecified abdominal pain: Secondary | ICD-10-CM

## 2015-05-11 DIAGNOSIS — I10 Essential (primary) hypertension: Secondary | ICD-10-CM | POA: Diagnosis present

## 2015-05-11 DIAGNOSIS — K8020 Calculus of gallbladder without cholecystitis without obstruction: Secondary | ICD-10-CM | POA: Diagnosis present

## 2015-05-11 DIAGNOSIS — R079 Chest pain, unspecified: Secondary | ICD-10-CM | POA: Diagnosis present

## 2015-05-11 DIAGNOSIS — F419 Anxiety disorder, unspecified: Secondary | ICD-10-CM | POA: Diagnosis present

## 2015-05-11 HISTORY — DX: Calculus of gallbladder without cholecystitis without obstruction: K80.20

## 2015-05-11 HISTORY — DX: Alcohol abuse, uncomplicated: F10.10

## 2015-05-11 LAB — LIPASE, BLOOD: Lipase: 25 U/L (ref 22–51)

## 2015-05-11 LAB — CBC WITH DIFFERENTIAL/PLATELET
BASOS ABS: 0 10*3/uL (ref 0.0–0.1)
Basophils Relative: 0 % (ref 0–1)
EOS ABS: 0 10*3/uL (ref 0.0–0.7)
Eosinophils Relative: 0 % (ref 0–5)
HEMATOCRIT: 44.1 % (ref 36.0–46.0)
HEMOGLOBIN: 14.7 g/dL (ref 12.0–15.0)
Lymphocytes Relative: 10 % — ABNORMAL LOW (ref 12–46)
Lymphs Abs: 0.9 10*3/uL (ref 0.7–4.0)
MCH: 31.4 pg (ref 26.0–34.0)
MCHC: 33.3 g/dL (ref 30.0–36.0)
MCV: 94.2 fL (ref 78.0–100.0)
Monocytes Absolute: 0.4 10*3/uL (ref 0.1–1.0)
Monocytes Relative: 5 % (ref 3–12)
NEUTROS ABS: 8.2 10*3/uL — AB (ref 1.7–7.7)
NEUTROS PCT: 85 % — AB (ref 43–77)
PLATELETS: 362 10*3/uL (ref 150–400)
RBC: 4.68 MIL/uL (ref 3.87–5.11)
RDW: 13.3 % (ref 11.5–15.5)
WBC: 9.5 10*3/uL (ref 4.0–10.5)

## 2015-05-11 LAB — TROPONIN I

## 2015-05-11 LAB — COMPREHENSIVE METABOLIC PANEL
ALK PHOS: 250 U/L — AB (ref 38–126)
ALT: 628 U/L — ABNORMAL HIGH (ref 14–54)
ANION GAP: 9 (ref 5–15)
AST: 853 U/L — ABNORMAL HIGH (ref 15–41)
Albumin: 4.4 g/dL (ref 3.5–5.0)
BILIRUBIN TOTAL: 2.7 mg/dL — AB (ref 0.3–1.2)
BUN: 14 mg/dL (ref 6–20)
CO2: 25 mmol/L (ref 22–32)
Calcium: 9 mg/dL (ref 8.9–10.3)
Chloride: 104 mmol/L (ref 101–111)
Creatinine, Ser: 0.78 mg/dL (ref 0.44–1.00)
GFR calc Af Amer: >60 mL/min (ref >60)
GLUCOSE: 146 mg/dL — AB (ref 65–99)
Potassium: 4 mmol/L (ref 3.5–5.1)
Sodium: 138 mmol/L (ref 135–145)
Total Protein: 7.9 g/dL (ref 6.5–8.1)

## 2015-05-11 MED ORDER — ACETAMINOPHEN 650 MG RE SUPP: 650.0000 mg | Freq: Four times a day (QID) | RECTAL | Status: DC | PRN

## 2015-05-11 MED ORDER — ESCITALOPRAM OXALATE 10 MG PO TABS
10.0000 mg | ORAL_TABLET | Freq: Every day | ORAL | Status: DC
  Administered 2015-05-12 – 2015-05-13 (×2): 10 mg via ORAL
  Filled 2015-05-11 (×3): qty 1

## 2015-05-11 MED ORDER — SENNOSIDES-DOCUSATE SODIUM 8.6-50 MG PO TABS: 1.0000 | ORAL_TABLET | Freq: Every evening | ORAL | Status: DC | PRN

## 2015-05-11 MED ORDER — SODIUM CHLORIDE 0.9 % IV SOLN
INTRAVENOUS | Status: AC
  Administered 2015-05-11 – 2015-05-12 (×2): via INTRAVENOUS

## 2015-05-11 MED ORDER — ACETAMINOPHEN 325 MG PO TABS: 650.0000 mg | ORAL_TABLET | Freq: Four times a day (QID) | ORAL | Status: DC | PRN

## 2015-05-11 MED ORDER — MORPHINE SULFATE 4 MG/ML IJ SOLN
4.0000 mg | INTRAMUSCULAR | Status: AC | PRN
  Administered 2015-05-11 (×3): 4 mg via INTRAVENOUS
  Filled 2015-05-11 (×3): qty 1

## 2015-05-11 MED ORDER — TRAZODONE HCL 50 MG PO TABS
25.0000 mg | ORAL_TABLET | Freq: Every evening | ORAL | Status: DC | PRN
Start: 2015-05-11 — End: 2015-05-13

## 2015-05-11 MED ORDER — ENOXAPARIN SODIUM 40 MG/0.4ML ~~LOC~~ SOLN
40.0000 mg | SUBCUTANEOUS | Status: DC
  Administered 2015-05-11 – 2015-05-12 (×2): 40 mg via SUBCUTANEOUS
  Filled 2015-05-11 (×2): qty 0.4

## 2015-05-11 MED ORDER — ONDANSETRON HCL 4 MG/2ML IJ SOLN: 4.0000 mg | Freq: Four times a day (QID) | INTRAMUSCULAR | Status: DC | PRN

## 2015-05-11 MED ORDER — PANTOPRAZOLE SODIUM 40 MG IV SOLR
40.0000 mg | Freq: Once | INTRAVENOUS | Status: AC
  Administered 2015-05-11: 40 mg via INTRAVENOUS
  Filled 2015-05-11: qty 40

## 2015-05-11 MED ORDER — ALUM & MAG HYDROXIDE-SIMETH 200-200-20 MG/5ML PO SUSP: 30.0000 mL | Freq: Four times a day (QID) | ORAL | Status: DC | PRN

## 2015-05-11 MED ORDER — BISACODYL 10 MG RE SUPP: 10.0000 mg | Freq: Every day | RECTAL | Status: DC | PRN

## 2015-05-11 MED ORDER — ONDANSETRON HCL 4 MG/2ML IJ SOLN
4.0000 mg | Freq: Once | INTRAMUSCULAR | Status: AC
  Administered 2015-05-11: 4 mg via INTRAVENOUS
  Filled 2015-05-11: qty 2

## 2015-05-11 MED ORDER — LAMOTRIGINE 100 MG PO TABS
200.0000 mg | ORAL_TABLET | Freq: Every day | ORAL | Status: DC
  Administered 2015-05-11 – 2015-05-12 (×2): 200 mg via ORAL
  Filled 2015-05-11 (×2): qty 2
  Filled 2015-05-11: qty 1
  Filled 2015-05-11: qty 2
  Filled 2015-05-11: qty 1

## 2015-05-11 MED ORDER — ONDANSETRON HCL 4 MG PO TABS: 4.0000 mg | ORAL_TABLET | Freq: Four times a day (QID) | ORAL | Status: DC | PRN

## 2015-05-11 MED ORDER — GI COCKTAIL ~~LOC~~
30.0000 mL | Freq: Once | ORAL | Status: DC
  Filled 2015-05-11: qty 30

## 2015-05-11 MED ORDER — CETYLPYRIDINIUM CHLORIDE 0.05 % MT LIQD
7.0000 mL | Freq: Two times a day (BID) | OROMUCOSAL | Status: DC
  Administered 2015-05-11 – 2015-05-12 (×3): 7 mL via OROMUCOSAL

## 2015-05-11 MED ORDER — ALPRAZOLAM 0.25 MG PO TABS: 0.2500 mg | ORAL_TABLET | Freq: Every evening | ORAL | Status: DC | PRN

## 2015-05-11 MED ORDER — CHLORHEXIDINE GLUCONATE 0.12 % MT SOLN
15.0000 mL | Freq: Two times a day (BID) | OROMUCOSAL | Status: DC
  Administered 2015-05-11 – 2015-05-13 (×4): 15 mL via OROMUCOSAL
  Filled 2015-05-11 (×4): qty 15

## 2015-05-11 MED ORDER — PANTOPRAZOLE SODIUM 40 MG IV SOLR
40.0000 mg | INTRAVENOUS | Status: DC
Start: 2015-05-11 — End: 2015-05-13
  Administered 2015-05-11 – 2015-05-12 (×2): 40 mg via INTRAVENOUS
  Filled 2015-05-11 (×2): qty 40

## 2015-05-11 MED ORDER — HYDROCODONE-ACETAMINOPHEN 5-325 MG PO TABS
1.0000 | ORAL_TABLET | ORAL | Status: DC | PRN
  Administered 2015-05-12: 2 via ORAL
  Filled 2015-05-11: qty 2

## 2015-05-11 MED ORDER — SODIUM CHLORIDE 0.9 % IV SOLN
Freq: Once | INTRAVENOUS | Status: AC
  Administered 2015-05-11: 06:00:00 via INTRAVENOUS

## 2015-05-11 MED ORDER — MORPHINE SULFATE 2 MG/ML IJ SOLN
2.0000 mg | INTRAMUSCULAR | Status: DC | PRN
  Administered 2015-05-11 (×2): 2 mg via INTRAVENOUS
  Filled 2015-05-11 (×2): qty 1

## 2015-05-11 NOTE — ED Provider Notes (Signed)
Pt received at sign out with US pending. Pt c/o upper abd pain, N/V intermittently x3 weeks, worse over the past 24 hours. LFT's elevated, WBC count normal, afebrile. US as below. T/C to [facility] Dr. Conley RollsLe, case discussed, including:  HPI, pertinent PM/SHx, VS/PE, dx testing, ED course and treatment:  Agreeable to admit, requests he will come to the ED for eval.   Results for orders placed or performed during the hospital encounter of 05/11/15  CBC with Differential/Platelet  Result Value Ref Range   WBC 9.5 4.0 - 10.5 K/uL   RBC 4.68 3.87 - 5.11 MIL/uL   Hemoglobin 14.7 12.0 - 15.0 g/dL   HCT 16.144.1 09.636.0 - 04.546.0 %   MCV 94.2 78.0 - 100.0 fL   MCH 31.4 26.0 - 34.0 pg   MCHC 33.3 30.0 - 36.0 g/dL   RDW 40.913.3 81.111.5 - 91.415.5 %   Platelets 362 150 - 400 K/uL   Neutrophils Relative % 85 (H) 43 - 77 %   Neutro Abs 8.2 (H) 1.7 - 7.7 K/uL   Lymphocytes Relative 10 (L) 12 - 46 %   Lymphs Abs 0.9 0.7 - 4.0 K/uL   Monocytes Relative 5 3 - 12 %   Monocytes Absolute 0.4 0.1 - 1.0 K/uL   Eosinophils Relative 0 0 - 5 %   Eosinophils Absolute 0.0 0.0 - 0.7 K/uL   Basophils Relative 0 0 - 1 %   Basophils Absolute 0.0 0.0 - 0.1 K/uL  Comprehensive metabolic panel  Result Value Ref Range   Sodium 138 135 - 145 mmol/L   Potassium 4.0 3.5 - 5.1 mmol/L   Chloride 104 101 - 111 mmol/L   CO2 25 22 - 32 mmol/L   Glucose, Bld 146 (H) 65 - 99 mg/dL   BUN 14 6 - 20 mg/dL   Creatinine, Ser 7.820.78 0.44 - 1.00 mg/dL   Calcium 9.0 8.9 - 95.610.3 mg/dL   Total Protein 7.9 6.5 - 8.1 g/dL   Albumin 4.4 3.5 - 5.0 g/dL   AST 213853 (H) 15 - 41 U/L   ALT 628 (H) 14 - 54 U/L   Alkaline Phosphatase 250 (H) 38 - 126 U/L   Total Bilirubin 2.7 (H) 0.3 - 1.2 mg/dL   GFR calc non Af Amer >60 >60 mL/min   GFR calc Af Amer >60 >60 mL/min   Anion gap 9 5 - 15  Lipase, blood  Result Value Ref Range   Lipase 25 22 - 51 U/L  Troponin I  Result Value Ref Range   Troponin I <0.03 <0.031 ng/mL   [place] Abdomen Limited Ruq 05/11/2015   CLINICAL  DATA:  Severe pain for 1 day. Mild pain for 1 month. Right upper quadrant pain. Nausea, vomiting.  EXAM: US ABDOMEN LIMITED - RIGHT UPPER QUADRANT  COMPARISON:  None.  FINDINGS: Gallbladder:  Within the gallbladder small stones are noted. Gallbladder sludge is present. Gallbladder wall is mildly thickened, 3.2 mm. No pericholecystic fluid. No sonographic Murphy sign.  Common bile duct:  Diameter: 7.8 mm which is mildly prominent though there is no sonographic evidence for choledocholithiasis.  Liver:  No focal lesion identified. Within normal limits in parenchymal echogenicity.  IMPRESSION: 1. Gallstones and sludge. No other ultrasound evidence for acute cholecystitis. 2. Mildly prominent common bile duct without definite choledocholithiasis identified.   Electronically Signed   By: Norva PavlovElizabeth  Brown M.D.   On: 05/11/2015 08:58       Samuel JesterKathleen Brennen Camper, DO 05/11/15 [phone]

## 2015-05-11 NOTE — ED Notes (Signed)
Pain across chest and across back, states it feels like acid reflux, taking otc meds without relief.

## 2015-05-11 NOTE — ED Notes (Signed)
Pt resting upon entering room. Equal rise and fall of chest noted.

## 2015-05-11 NOTE — Progress Notes (Signed)
[facility] called to report that they will do the hepatobiliary scan for patient at 0730 in the morning 05/12/15 d/t patient last having pain medication at 1334 and it has to be 6hrs past last pain med given with patient remaining NPO. Patient aware, NP notified.

## 2015-05-11 NOTE — ED Provider Notes (Signed)
CSN: [id number]     Arrival date & time 05/11/15  0325 History   First MD Initiated Contact with Patient 05/11/15 [phone]     Chief Complaint  Patient presents with  . Chest Pain      HPI  Patient presents for evaluation of epigastric pain reached to her back with nausea and vomiting. States it "feels like I might be having acid reflux". However she's been taking Zantac and Prilosec. Symptoms intimately for 3 weeks. Persistent last 24 hours. She is unable to keep down any medications or fluids at home and presents here. Heme-negative nonbilious emesis. No fevers no chills. Pain is more epigastric than in her chest. No difficulty breathing or shortness of breath.  Over the last few weeks it has been worse at night. She does not recall any frank food intolerances. Denies excessive alcohol, tobacco, caffeine, or anti-inflammatory use.  Past Medical History  Diagnosis Date  . Essential hypertension, benign   . Incompetent cervix   . Abnormal Pap smear   . Anxiety   . Mental disorder   . Depression 04/15/2013  . OSA on CPAP   . Bipolar disorder   . PTSD (post-traumatic stress disorder)   . Adult ADHD   . GAD (generalized anxiety disorder)   . Obesity   . Thickened endometrium 05/02/2014  . Arthritis     right knee  . Sleep apnea    Past Surgical History  Procedure Laterality Date  . Tonsillectomy    . Dilation and curettage of uterus    . Cervical cerclage    . Knee surgery Right    Family History  Problem Relation Age of Onset  . Hypertension Father   . Cancer Father   . Hypertension Mother   . Pancreatic cancer Mother   . Asthma Mother   . Sleep apnea Mother   . Cancer Mother     pancreas  . Coronary artery disease Maternal Grandfather   . Sleep apnea Brother   . Stomach cancer Paternal Aunt   . Stomach cancer Paternal Uncle   . Colon cancer Neg Hx   . Esophageal cancer Neg Hx   . Rectal cancer Neg Hx   . Cancer Maternal Aunt     lung  . Stroke Maternal Aunt   .  Heart attack Maternal Aunt    History  Substance Use Topics  . Smoking status: Former Smoker -- 1.00 packs/day for 25 years    Types: Cigarettes    Quit date: 12/30/2005  . Smokeless tobacco: Never Used  . Alcohol Use: 4.2 oz/week    7 Glasses of wine per week     Comment: One glass of wine daily   OB History    Gravida Para Term Preterm AB TAB SAB Ectopic Multiple Living   4 3   1     2      Review of Systems  Constitutional: Negative for fever, chills, diaphoresis, appetite change and fatigue.  HENT: Negative for mouth sores, sore throat and trouble swallowing.   Eyes: Negative for visual disturbance.  Respiratory: Negative for cough, chest tightness, shortness of breath and wheezing.   Cardiovascular: Negative for chest pain.  Gastrointestinal: Positive for nausea, vomiting and abdominal pain. Negative for diarrhea and abdominal distention.  Endocrine: Negative for polydipsia, polyphagia and polyuria.  Genitourinary: Negative for dysuria, frequency and hematuria.  Musculoskeletal: Positive for back pain. Negative for gait problem.  Skin: Negative for color change, pallor and rash.  Neurological: Negative for dizziness,  syncope, light-headedness and headaches.  Hematological: Does not bruise/bleed easily.  Psychiatric/Behavioral: Negative for behavioral problems and confusion.      Allergies  Prednisone  Home Medications   Prior to Admission medications   Medication Sig Start Date End Date Taking? Authorizing Provider  escitalopram (LEXAPRO) 20 MG tablet TAKE 1 TABLET BY MOUTH EVERY DAY Patient taking differently: takes half a tab daily. 12/05/14  Yes Adline PotterJennifer A Griffin, NP  hydrochlorothiazide (HYDRODIURIL) 25 MG tablet TAKE 1 TABLET BY MOUTH EVERY DAY   Yes Adline PotterJennifer A Griffin, NP  ibuprofen (ADVIL,MOTRIN) 200 MG tablet Take 200 mg by mouth every 6 (six) hours as needed. Takes 4 tabs TID   Yes Historical Provider, MD  lamoTRIgine (LAMICTAL) 200 MG tablet 200 mg daily.   12/19/14  Yes Historical Provider, MD  medroxyPROGESTERone (PROVERA) 10 MG tablet Take 1 tablet daily as instructed 04/27/15  Yes Lazaro ArmsLuther H Eure, MD  ALPRAZolam Prudy Feeler(XANAX) 0.25 MG tablet Take 0.25 mg by mouth at bedtime as needed for sleep.    Historical Provider, MD   BP 161/66 mmHg  Pulse 65  Temp(Src) 98.6 F (37 C) (Oral)  Resp 19  Ht 5\' 6"  (1.676 m)  Wt 213 lb (96.616 kg)  BMI 34.40 kg/m2  SpO2 100% Physical Exam  Constitutional: She is oriented to person, place, and time. She appears well-developed and well-nourished. No distress.  HENT:  Head: Normocephalic.  Eyes: Conjunctivae are normal. Pupils are equal, round, and reactive to light. No scleral icterus.  Neck: Normal range of motion. Neck supple. No thyromegaly present.  Cardiovascular: Normal rate and regular rhythm.  Exam reveals no gallop and no friction rub.   No murmur heard. Pulmonary/Chest: Effort normal and breath sounds normal. No respiratory distress. She has no wheezes. She has no rales.  Abdominal: Soft. Bowel sounds are normal. She exhibits no distension. There is no tenderness. There is no rebound.    Epigastric discomfort to palpation. No frank guarding rebound or peritoneal irritation. Normal active bowel sounds.  Musculoskeletal: Normal range of motion.  Neurological: She is alert and oriented to person, place, and time.  Skin: Skin is warm and dry. No rash noted.  Psychiatric: She has a normal mood and affect. Her behavior is normal.    ED Course  Procedures (including critical care time) Labs Review Labs Reviewed  CBC WITH DIFFERENTIAL/PLATELET - Abnormal; Notable for the following:    Neutrophils Relative % 85 (*)    Neutro Abs 8.2 (*)    Lymphocytes Relative 10 (*)    All other components within normal limits  COMPREHENSIVE METABOLIC PANEL - Abnormal; Notable for the following:    Glucose, Bld 146 (*)    AST 853 (*)    ALT 628 (*)    Alkaline Phosphatase 250 (*)    Total Bilirubin 2.7 (*)     All other components within normal limits  LIPASE, BLOOD  TROPONIN I    Imaging Review No results found.   EKG Interpretation None      MDM   Final diagnoses:  Abdominal pain    Patient with intermittent abdominal pain over the last few weeks. Persistent for 24 hours. Elevation of hepatobiliary enzymes quitting alkaline phosphatase and bilirubin highly suggestive of biliary tract obstruction. Given IV pain meds. Nausea is improved. Pain has improved. No leukocytosis or fever. Ultrasound pending.   Rolland PorterMark Armida Vickroy, MD 05/11/15 [phone]

## 2015-05-11 NOTE — Progress Notes (Signed)
Patient refused CPAP for tonight. Stated she would call if she changed her mind. Patient stated she doesn't use every night at home.

## 2015-05-11 NOTE — ED Notes (Signed)
[name] [name] at bedside  

## 2015-05-11 NOTE — H&P (Signed)
[facility] History and Physical  [name] [name] ZOX:[id number] DOB: [birth year] DOA: 05/11/2015  Referring physician: Clarene Dukemcmanus PCP: Kirk RuthsMCGOUGH,WILLIAM M, MD   Chief Complaint: Persistent worsening abdominal pain with nausea and vomiting  HPI: [name] [name] is a 54 y.o. female with a past medical history significant for hypertension, bipolar disorder, obesity, sleep apnea presents to the emergency department with the chief complaint of persistent and worsening abdominal pain associated with nausea and vomiting. Initial evaluation in the emergency department includes a abdominal ultrasound revealing gallstones with sludge, elevated LFTs.  Patient reports several day history of intermittent abdominal pain with nausea and no vomiting. Over the last several days this pain became more constant yesterday she developed nausea and vomiting and reports 6-10 episodes of emesis. She denies coffee ground emesis. She describes the pain as sharp located epigastric area radiating to her right upper quadrant and right upper back. She reports "felt like I had indigestion". Reports abdominal distention that has improved. She's been taking Zantac and Prilosec without relief. She denies chest pain palpitations diaphoresis shortness of breath headache visual disturbances. She denies diarrhea constipation melena. She denies dysuria hematuria frequency or urgency. She denies fever chills recent travel or sick contacts.  Workup includes complete blood count is unremarkable, comprehensive metabolic panel significant for serum glucose of 146, alkaline phosphatase 250, AST 853 ALT 628 total bilirubin 2.7, initial troponin is negative. Abdominal ultrasound with gallstones and sludge without acute cholecystitis, mildly prominent common bile duct without definite choledocholithiasis identified.  In the emergency department she is afebrile hemodynamically stable and not hypoxic. She is given morphine and Zofran and PPI in the  emergency department with good relief.  Review of Systems:  10 point review of systems complete and all systems are negative except as indicated in the history of present illness  Past Medical History  Diagnosis Date  . Essential hypertension, benign   . Incompetent cervix   . Abnormal Pap smear   . Anxiety   . Mental disorder   . Depression 04/15/2013  . OSA on CPAP   . Bipolar disorder   . PTSD (post-traumatic stress disorder)   . Adult ADHD   . GAD (generalized anxiety disorder)   . Obesity   . Thickened endometrium 05/02/2014  . Arthritis     right knee  . Sleep apnea   . Cholelithiases     04/2015  . ETOH abuse     drinks wine 5 day/week   Past Surgical History  Procedure Laterality Date  . Tonsillectomy    . Dilation and curettage of uterus    . Cervical cerclage    . Knee surgery Right    Social History:  reports that she quit smoking about 9 years ago. Her smoking use included Cigarettes. She has a 25 pack-year smoking history. She has never used smokeless tobacco. She reports that she drinks about 4.2 oz of alcohol per week. She reports that she does not use illicit drugs. She lives at home with her family she is independent with ADLs she is a retired formerly employed by [employer]  Allergies  Allergen Reactions  . Prednisone Shortness Of Breath and Swelling    Family History  Problem Relation Age of Onset  . Hypertension Father   . Cancer Father   . Hypertension Mother   . Pancreatic cancer Mother   . Asthma Mother   . Sleep apnea Mother   . Cancer Mother     pancreas  .  Coronary artery disease Maternal Grandfather   . Sleep apnea Brother   . Stomach cancer Paternal Aunt   . Stomach cancer Paternal Uncle   . Colon cancer Neg Hx   . Esophageal cancer Neg Hx   . Rectal cancer Neg Hx   . Cancer Maternal Aunt     lung  . Stroke Maternal Aunt   . Heart attack Maternal Aunt      Prior to Admission medications   Medication Sig  Start Date End Date Taking? Authorizing Provider  escitalopram (LEXAPRO) 20 MG tablet TAKE 1 TABLET BY MOUTH EVERY DAY Patient taking differently: 1/2 tablet daily 12/05/14  Yes Adline Potter, NP  hydrochlorothiazide (HYDRODIURIL) 25 MG tablet TAKE 1 TABLET BY MOUTH EVERY DAY   Yes Adline Potter, NP  ibuprofen (ADVIL,MOTRIN) 200 MG tablet Take 800 mg by mouth 3 (three) times daily as needed for moderate pain.    Yes Historical Provider, MD  lamoTRIgine (LAMICTAL) 200 MG tablet 200 mg daily.  12/19/14  Yes Historical Provider, MD  medroxyPROGESTERone (PROVERA) 10 MG tablet Take 1 tablet daily as instructed Patient taking differently: Take 10 mg by mouth as directed. Take 1 tablet every 2 months 04/27/15  Yes Lazaro Arms, MD  ALPRAZolam Prudy Feeler) 0.25 MG tablet Take 0.25 mg by mouth at bedtime as needed for sleep.    Historical Provider, MD   Physical Exam: Filed Vitals:   05/11/15 0817 05/11/15 0830 05/11/15 0900 05/11/15 0930  BP: 156/71 155/69 152/70 146/81  Pulse: 74 66 67 65  Temp:      TempSrc:      Resp: Height:      Weight:      SpO2: 100% 100% 99% 99%    Wt Readings from Last 3 Encounters:  05/11/15 96.616 kg (213 lb)  04/27/15 97.523 kg (215 lb)  04/19/15 97.977 kg (216 lb)    General:  Appears calm and comfortable, obese Eyes: PERRL, normal lids, irises & conjunctiva ENT: grossly normal hearing, lips & tongue, he does membranes of her mouth are dry Neck: no LAD, masses or thyromegaly Cardiovascular: RRR, no m/r/g. No LE edema. Pulses present and palpable Respiratory: CTA bilaterally, no w/r/r. Normal respiratory effort. Abdomen: soft, obese, sluggish bowel sounds, only mild tenderness in upper right quadrant Skin: no rash or induration seen on limited exam Musculoskeletal: grossly normal tone BUE/BLE Psychiatric: grossly normal mood and affect, speech fluent and appropriate Neurologic: grossly non-focal. speech clear facial symmetry            Labs on Admission:  Basic Metabolic Panel:  Recent Labs Lab 05/11/15 0355  NA 138  K 4.0  CL 104  CO2 25  GLUCOSE 146*  BUN 14  CREATININE 0.78  CALCIUM 9.0   Liver Function Tests:  Recent Labs Lab 05/11/15 0355  AST 853*  ALT 628*  ALKPHOS 250*  BILITOT 2.7*  PROT 7.9  ALBUMIN 4.4    Recent Labs Lab 05/11/15 0355  LIPASE 25   No results for input(s): AMMONIA in the last 168 hours. CBC:  Recent Labs Lab 05/11/15 0355  WBC 9.5  NEUTROABS 8.2*  HGB 14.7  HCT 44.1  MCV 94.2  PLT 362   Cardiac Enzymes:  Recent Labs Lab 05/11/15 0355  TROPONINI <0.03    BNP (last 3 results) No results for input(s): BNP in the last 8760 hours.  ProBNP (last 3 results) No results for input(s): PROBNP in the last 8760 hours.  CBG: No results for input(s): GLUCAP in the last 168 hours.  Radiological Exams on Admission: [place] Abdomen Limited Ruq  05/11/2015   CLINICAL DATA:  Severe pain for 1 day. Mild pain for 1 month. Right upper quadrant pain. Nausea, vomiting.  EXAM: US ABDOMEN LIMITED - RIGHT UPPER QUADRANT  COMPARISON:  None.  FINDINGS: Gallbladder:  Within the gallbladder small stones are noted. Gallbladder sludge is present. Gallbladder wall is mildly thickened, 3.2 mm. No pericholecystic fluid. No sonographic Murphy sign.  Common bile duct:  Diameter: 7.8 mm which is mildly prominent though there is no sonographic evidence for choledocholithiasis.  Liver:  No focal lesion identified. Within normal limits in parenchymal echogenicity.  IMPRESSION: 1. Gallstones and sludge. No other ultrasound evidence for acute cholecystitis. 2. Mildly prominent common bile duct without definite choledocholithiasis identified.   Electronically Signed   By: Norva PavlovElizabeth  Brown M.D.   On: 05/11/2015 08:58    EKG: Independently reviewed. Sinus rhythm with minimal ST depression  Assessment/Plan Principal Problem:   Cholelithiases: Abdominal ultrasound with gallstones and sludge in  setting of elevated alkaline phosphatase and total bilirubin concerning for biliary tract obstruction. Lipase within the limits of normal. Will admit to medical floor. Will provide aggressive IV fluids and bowel rest pain management and antiemetic. Obtain HIDA scan.  Will request gastroenterology consult. Active Problems: Elevated LFTs: Likely related to above. GI consult requested. IV fluids as noted above. Will recheck in the morning    Hyperglycemia: Mild. No history of diabetes however she reports that her brother does have diabetes. Will obtain a hemoglobin A1c and monitor    Obesity: BMI 34.5. Nutritional consult    Essential hypertension, benign: Controlled. Home medications include HCTZ I will hold this for now. Will provide when necessary hydralazine.    OSA (obstructive sleep apnea): Respiratory therapy for C Pap.    Anxiety: Appears stable at baseline. Home medications include Lexapro and Xanax. Will continue this.      Tobacco abuse: Cessation counseling offered.    gastroenterology  Code Status: full DVT Prophylaxis: Family Communication: daughter at bedside Disposition Plan: home when ready  Time spent: 60 minutes  [facility] White [facility] Center - GarlandBLACK,KAREN M [facility] Pager [phone]

## 2015-05-11 NOTE — Progress Notes (Signed)
1520 Patient arrived to 300 unit room# 332 via w/c from ED by staff. A&O, no c/o pain at this time. Patient oriented to room, call bell/TV and staff. Bed in lowest position.

## 2015-05-12 ENCOUNTER — Observation Stay (HOSPITAL_COMMUNITY): Payer: BLUE CROSS/BLUE SHIELD

## 2015-05-12 ENCOUNTER — Encounter (HOSPITAL_COMMUNITY): Payer: Self-pay

## 2015-05-12 DIAGNOSIS — I10 Essential (primary) hypertension: Secondary | ICD-10-CM | POA: Diagnosis not present

## 2015-05-12 DIAGNOSIS — R7989 Other specified abnormal findings of blood chemistry: Secondary | ICD-10-CM | POA: Diagnosis not present

## 2015-05-12 LAB — COMPREHENSIVE METABOLIC PANEL
ALK PHOS: 203 U/L — AB (ref 38–126)
ALT: 395 U/L — ABNORMAL HIGH (ref 14–54)
AST: 219 U/L — ABNORMAL HIGH (ref 15–41)
Albumin: 3.3 g/dL — ABNORMAL LOW (ref 3.5–5.0)
Anion gap: 7 (ref 5–15)
BUN: 16 mg/dL (ref 6–20)
CO2: 24 mmol/L (ref 22–32)
Calcium: 8.3 mg/dL — ABNORMAL LOW (ref 8.9–10.3)
Chloride: 108 mmol/L (ref 101–111)
Creatinine, Ser: 0.71 mg/dL (ref 0.44–1.00)
GFR calc Af Amer: >60 mL/min (ref >60)
Glucose, Bld: 88 mg/dL (ref 65–99)
POTASSIUM: 3.8 mmol/L (ref 3.5–5.1)
SODIUM: 139 mmol/L (ref 135–145)
TOTAL PROTEIN: 6.1 g/dL — AB (ref 6.5–8.1)
Total Bilirubin: 1.1 mg/dL (ref 0.3–1.2)

## 2015-05-12 LAB — CBC
HEMATOCRIT: 39.5 % (ref 36.0–46.0)
Hemoglobin: 12.5 g/dL (ref 12.0–15.0)
MCH: 30.8 pg (ref 26.0–34.0)
MCHC: 31.6 g/dL (ref 30.0–36.0)
MCV: 97.3 fL (ref 78.0–100.0)
Platelets: 302 10*3/uL (ref 150–400)
RBC: 4.06 MIL/uL (ref 3.87–5.11)
RDW: 13.6 % (ref 11.5–15.5)
WBC: 5.9 10*3/uL (ref 4.0–10.5)

## 2015-05-12 LAB — HEMOGLOBIN A1C
HEMOGLOBIN A1C: 5.7 % — AB (ref 4.8–5.6)
Mean Plasma Glucose: 117 mg/dL

## 2015-05-12 MED ORDER — SODIUM CHLORIDE 0.9 % IJ SOLN
INTRAMUSCULAR | Status: AC
  Filled 2015-05-12: qty 36

## 2015-05-12 MED ORDER — TECHNETIUM TC 99M MEBROFENIN IV KIT
5.0000 | PACK | Freq: Once | INTRAVENOUS | Status: AC | PRN
  Administered 2015-05-12: 5 via INTRAVENOUS

## 2015-05-12 MED ORDER — STERILE WATER FOR INJECTION IJ SOLN
INTRAMUSCULAR | Status: AC
  Administered 2015-05-12: 1.93 mL via INTRAVENOUS
  Filled 2015-05-12: qty 10

## 2015-05-12 MED ORDER — SINCALIDE 5 MCG IJ SOLR
INTRAMUSCULAR | Status: AC
  Administered 2015-05-12: 1.93 ug via INTRAVENOUS
  Filled 2015-05-12: qty 5

## 2015-05-12 MED ORDER — SODIUM CHLORIDE 0.9 % IJ SOLN
INTRAMUSCULAR | Status: AC
  Filled 2015-05-12: qty 6

## 2015-05-12 NOTE — Progress Notes (Signed)
[facility] PROGRESS NOTE  [name] [name] [id number] DOB: [birth year]    PCP:   Kirk RuthsMCGOUGH,WILLIAM M, MD   HPI: [name] [name] is an 54 y.o. female admitted for likely passed gallbladder stone.  She is feeling better and is asking to eat.  HIDA scan only showed 11% EF with CCK, and I spoke with Dr Lovell SheehanJenkins, who will see her tomorrow inconsideration for lap CCY.   Rewiew of Systems:  Constitutional: Negative for malaise, fever and chills. No significant weight loss or weight gain Eyes: Negative for eye pain, redness and discharge, diplopia, visual changes, or flashes of light. ENMT: Negative for ear pain, hoarseness, nasal congestion, sinus pressure and sore throat. No headaches; tinnitus, drooling, or problem swallowing. Cardiovascular: Negative for chest pain, palpitations, diaphoresis, dyspnea and peripheral edema. ; No orthopnea, PND Respiratory: Negative for cough, hemoptysis, wheezing and stridor. No pleuritic chestpain. Gastrointestinal: Negative for nausea, vomiting, diarrhea, constipation, abdominal pain, melena, blood in stool, hematemesis, jaundice and rectal bleeding.    Genitourinary: Negative for frequency, dysuria, incontinence,flank pain and hematuria; Musculoskeletal: Negative for back pain and neck pain. Negative for swelling and trauma.;  Skin: . Negative for pruritus, rash, abrasions, bruising and skin lesion.; ulcerations Neuro: Negative for headache, lightheadedness and neck stiffness. Negative for weakness, altered level of consciousness , altered mental status, extremity weakness, burning feet, involuntary movement, seizure and syncope.  Psych: negative for anxiety, depression, insomnia, tearfulness, panic attacks, hallucinations, paranoia, suicidal or homicidal ideation   Past Medical History  Diagnosis Date  . Essential hypertension, benign   . Incompetent cervix   . Abnormal Pap smear   . Anxiety   . Mental disorder   . Depression 04/15/2013  . OSA on CPAP    . Bipolar disorder   . PTSD (post-traumatic stress disorder)   . Adult ADHD   . GAD (generalized anxiety disorder)   . Obesity   . Thickened endometrium 05/02/2014  . Arthritis     right knee  . Sleep apnea   . Cholelithiases     04/2015  . ETOH abuse     drinks wine 5 day/week    Past Surgical History  Procedure Laterality Date  . Tonsillectomy    . Dilation and curettage of uterus    . Cervical cerclage    . Knee surgery Right     Medications:  HOME MEDS: Prior to Admission medications   Medication Sig Start Date End Date Taking? Authorizing Provider  escitalopram (LEXAPRO) 20 MG tablet TAKE 1 TABLET BY MOUTH EVERY DAY Patient taking differently: 1/2 tablet daily 12/05/14  Yes Adline PotterJennifer A Griffin, NP  hydrochlorothiazide (HYDRODIURIL) 25 MG tablet TAKE 1 TABLET BY MOUTH EVERY DAY   Yes Adline PotterJennifer A Griffin, NP  ibuprofen (ADVIL,MOTRIN) 200 MG tablet Take 800 mg by mouth 3 (three) times daily as needed for moderate pain.    Yes Historical Provider, MD  lamoTRIgine (LAMICTAL) 200 MG tablet 200 mg daily.  12/19/14  Yes Historical Provider, MD  medroxyPROGESTERone (PROVERA) 10 MG tablet Take 1 tablet daily as instructed Patient taking differently: Take 10 mg by mouth as directed. Take 1 tablet every 2 months 04/27/15  Yes Lazaro ArmsLuther H Eure, MD  ALPRAZolam Prudy Feeler(XANAX) 0.25 MG tablet Take 0.25 mg by mouth at bedtime as needed for sleep.    Historical Provider, MD     Allergies:  Allergies  Allergen Reactions  . Prednisone Shortness Of Breath and Swelling    Social History:   reports that she quit smoking about  9 years ago. Her smoking use included Cigarettes. She has a 25 pack-year smoking history. She has never used smokeless tobacco. She reports that she drinks about 4.2 oz of alcohol per week. She reports that she does not use illicit drugs.  Family History: Family History  Problem Relation Age of Onset  . Hypertension Father   . Cancer Father   . Hypertension Mother   .  Pancreatic cancer Mother   . Asthma Mother   . Sleep apnea Mother   . Cancer Mother     pancreas  . Coronary artery disease Maternal Grandfather   . Sleep apnea Brother   . Stomach cancer Paternal Aunt   . Stomach cancer Paternal Uncle   . Colon cancer Neg Hx   . Esophageal cancer Neg Hx   . Rectal cancer Neg Hx   . Cancer Maternal Aunt     lung  . Stroke Maternal Aunt   . Heart attack Maternal Aunt      Physical Exam: Filed Vitals:   05/11/15 1603 05/11/15 2130 05/12/15 0305 05/12/15 0506  BP: 119/53 137/60 140/57 142/63  Pulse: 61 58 63 58  Temp: 98.1 F (36.7 C) 97.6 F (36.4 C) 98.3 F (36.8 C) 98.3 F (36.8 C)  TempSrc: Oral Oral Oral Oral  Resp: 16 20 20 20   Height:      Weight:      SpO2: 98% 98% 98% 99%   Blood pressure 142/63, pulse 58, temperature 98.3 F (36.8 C), temperature source Oral, resp. rate 20, height 5\' 6"  (1.676 m), weight 96.616 kg (213 lb), SpO2 99 %.  GEN:  Pleasant patient lying in the stretcher in no acute distress; cooperative with exam. PSYCH:  alert and oriented x4; does not appear anxious or depressed; affect is appropriate. HEENT: Mucous membranes pink and anicteric; PERRLA; EOM intact; no cervical lymphadenopathy nor thyromegaly or carotid bruit; no JVD; There were no stridor. Neck is very supple. Breasts:: Not examined CHEST WALL: No tenderness CHEST: Normal respiration, clear to auscultation bilaterally.  HEART: Regular rate and rhythm.  There are no murmur, rub, or gallops.   BACK: No kyphosis or scoliosis; no CVA tenderness ABDOMEN: soft and non-tender; no masses, no organomegaly, normal abdominal bowel sounds; no pannus; no intertriginous candida. There is no rebound and no distention. Rectal Exam: Not done EXTREMITIES: No bone or joint deformity; age-appropriate arthropathy of the hands and knees; no edema; no ulcerations.  There is no calf tenderness. Genitalia: not examined PULSES: 2+ and symmetric SKIN: Normal hydration no  rash or ulceration CNS: Cranial nerves 2-12 grossly intact no focal lateralizing neurologic deficit.  Speech is fluent; uvula elevated with phonation, facial symmetry and tongue midline. DTR are normal bilaterally, cerebella exam is intact, barbinski is negative and strengths are equaled bilaterally.  No sensory loss.   Labs on Admission:  Basic Metabolic Panel:  Recent Labs Lab 05/11/15 0355 05/12/15 0643  NA 138 139  K 4.0 3.8  CL 104 108  CO2 25 24  GLUCOSE 146* 88  BUN 14 16  CREATININE 0.78 0.71  CALCIUM 9.0 8.3*   Liver Function Tests:  Recent Labs Lab 05/11/15 0355 05/12/15 0643  AST 853* 219*  ALT 628* 395*  ALKPHOS 250* 203*  BILITOT 2.7* 1.1  PROT 7.9 6.1*  ALBUMIN 4.4 3.3*    Recent Labs Lab 05/11/15 0355  LIPASE 25   No results for input(s): AMMONIA in the last 168 hours. CBC:  Recent Labs Lab 05/11/15 0355 05/12/15 [phone]  WBC 9.5 5.9  NEUTROABS 8.2*  --   HGB 14.7 12.5  HCT 44.1 39.5  MCV 94.2 97.3  PLT 362 302   Cardiac Enzymes:  Recent Labs Lab 05/11/15 0355  TROPONINI <0.03    CBG: No results for input(s): GLUCAP in the last 168 hours.   Radiological Exams on Admission: Nm Hepato W/eject Fract  05/12/2015   CLINICAL DATA:  Abdominal pain, back pain for 2 weeks  EXAM: NUCLEAR MEDICINE HEPATOBILIARY IMAGING WITH GALLBLADDER EF  TECHNIQUE: Sequential images of the abdomen were obtained out to 60 minutes following intravenous administration of radiopharmaceutical. After slow intravenous infusion of micrograms Cholecystokinin, gallbladder ejection fraction was determined.  RADIOPHARMACEUTICALS:  5 mCi Technetium-70m Choletec IV  COMPARISON:  None.  FINDINGS: There is normal uptake of the tracer by the liver. CBD visualized at 10 minutes. Bowel activity noted at 15 minutes. Gallbladder is visualized at 45 minutes. Post CCK gallbladder ejection fraction is only 11%. At 45 min, normal ejection fraction is greater than 40%.  The patient reported  no symptoms after CCK administration.  IMPRESSION: No cystic duct obstruction. No CBD obstruction. Post CCK gallbladder ejection fraction is only 11%. Normal ejection fraction is greater than 40%   Electronically Signed   By: Natasha Mead M.D.   On: 05/12/2015 09:58   US Abdomen Limited Ruq  05/11/2015   CLINICAL DATA:  Severe pain for 1 day. Mild pain for 1 month. Right upper quadrant pain. Nausea, vomiting.  EXAM: US ABDOMEN LIMITED - RIGHT UPPER QUADRANT  COMPARISON:  None.  FINDINGS: Gallbladder:  Within the gallbladder small stones are noted. Gallbladder sludge is present. Gallbladder wall is mildly thickened, 3.2 mm. No pericholecystic fluid. No sonographic Murphy sign.  Common bile duct:  Diameter: 7.8 mm which is mildly prominent though there is no sonographic evidence for choledocholithiasis.  Liver:  No focal lesion identified. Within normal limits in parenchymal echogenicity.  IMPRESSION: 1. Gallstones and sludge. No other ultrasound evidence for acute cholecystitis. 2. Mildly prominent common bile duct without definite choledocholithiasis identified.   Electronically Signed   By: Norva Pavlov M.D.   On: 05/11/2015 08:58    Assessment/Plan Present on Admission:  . Essential hypertension, benign . OSA (obstructive sleep apnea) . HTN (hypertension) . Anxiety . Elevated LFTs . Cholelithiases . Hyperglycemia . Obesity . Tobacco abuse  PLAN:  Will continue with IVF,  Repeat LFT in the am, and surgical consultation in the am.  Will advance her diet as tolerated.  Continue with her current treatment.   Other plans as per orders.  Code Status: FULL Unk Lightning, MD. [facility] Pager [phone] 7pm to 7am.  05/12/2015, 12:18 PM

## 2015-05-12 NOTE — Care Management Note (Signed)
Case Management Note  Patient Details  Name: [name] [name] MRN: [id number] Date of Birth: 12/22/1961  Subjective/Objective:                  Pt admitted from home with abd pain. Pt lives alone and will return home at discharge. Pt is independent with ADl's.  Action/Plan: Anticipate discharge over the weekend and return to have surgery as outpt if pt pain controlled and tolerating diet.  Expected Discharge Date:                  Expected Discharge Plan:  Home/Self Care  In-House Referral:  NA  Discharge planning Services  CM Consult  Post Acute Care Choice:  NA Choice offered to:  NA  DME Arranged:    DME Agency:     HH Arranged:    HH Agency:     Status of Service:  Completed, signed off  Medicare Important Message Given:    Date Medicare IM Given:    Medicare IM give by:    Date Additional Medicare IM Given:    Additional Medicare Important Message give by:     If discussed at Long Length of Stay Meetings, dates discussed:    Additional Comments:  Cheryl FlashBlackwell, Diontre Harps Crowder, RN 05/12/2015, 2:19 PM

## 2015-05-13 DIAGNOSIS — I10 Essential (primary) hypertension: Secondary | ICD-10-CM | POA: Diagnosis not present

## 2015-05-13 DIAGNOSIS — R7989 Other specified abnormal findings of blood chemistry: Secondary | ICD-10-CM | POA: Diagnosis not present

## 2015-05-13 LAB — HEPATIC FUNCTION PANEL
ALT: 269 U/L — AB (ref 14–54)
AST: 78 U/L — AB (ref 15–41)
Albumin: 3.6 g/dL (ref 3.5–5.0)
Alkaline Phosphatase: 205 U/L — ABNORMAL HIGH (ref 38–126)
Bilirubin, Direct: 0.2 mg/dL (ref 0.1–0.5)
Indirect Bilirubin: 0.5 mg/dL (ref 0.3–0.9)
Total Bilirubin: 0.7 mg/dL (ref 0.3–1.2)
Total Protein: 6.7 g/dL (ref 6.5–8.1)

## 2015-05-13 MED ORDER — HYDROCODONE-ACETAMINOPHEN 5-325 MG PO TABS: 1.0000 | ORAL_TABLET | ORAL | Status: DC | PRN

## 2015-05-13 MED ORDER — ONDANSETRON HCL 4 MG PO TABS: 4.0000 mg | ORAL_TABLET | Freq: Four times a day (QID) | ORAL | Status: DC | PRN

## 2015-05-13 NOTE — Discharge Summary (Signed)
Physician Discharge Summary  [name] [name] WUJ:[id number] DOB: Nov 26, 1961 DOA: 05/11/2015  PCP: Kirk RuthsMCGOUGH,WILLIAM M, MD  Admit date: 05/11/2015 Discharge date: 05/13/2015  Time spent: 65 minutes  Recommendations for Outpatient Follow-up:  1. Follow up with Dr Lovell SheehanJenkins as scheduled by him.  Discharge Diagnoses:  Principal Problem:   Cholelithiases Active Problems:   Essential hypertension, benign   OSA (obstructive sleep apnea)   HTN (hypertension)   Anxiety   Elevated LFTs   Hyperglycemia   Obesity   Tobacco abuse   Discharge Condition:  Much improved.  Diet recommendation: avoid fatty food.   Filed Weights   05/11/15 0340  Weight: 96.616 kg (213 lb)    History of present illness: patient was admitted by me on May 17, 2015.  As per NP Clydie BraunKaren black H and P;  "  [name] [name] is a 54 y.o. female with a past medical history significant for hypertension, bipolar disorder, obesity, sleep apnea presents to the emergency department with the chief complaint of persistent and worsening abdominal pain associated with nausea and vomiting. Initial evaluation in the emergency department includes a abdominal ultrasound revealing gallstones with sludge, elevated LFTs.  Patient reports several day history of intermittent abdominal pain with nausea and no vomiting. Over the last several days this pain became more constant yesterday she developed nausea and vomiting and reports 6-10 episodes of emesis. She denies coffee ground emesis. She describes the pain as sharp located epigastric area radiating to her right upper quadrant and right upper back. She reports "felt like I had indigestion". Reports abdominal distention that has improved. She's been taking Zantac and Prilosec without relief. She denies chest pain palpitations diaphoresis shortness of breath headache visual disturbances. She denies diarrhea constipation melena. She denies dysuria hematuria frequency or urgency. She denies fever chills  recent travel or sick contacts.  Workup includes complete blood count is unremarkable, comprehensive metabolic panel significant for serum glucose of 146, alkaline phosphatase 250, AST 853 ALT 628 total bilirubin 2.7, initial troponin is negative. Abdominal ultrasound with gallstones and sludge without acute cholecystitis, mildly prominent common bile duct without definite choledocholithiasis identified.  In the emergency department she is afebrile hemodynamically stable and not hypoxic. She is given morphine and Zofran and PPI in the emergency department with good relief.  Hospital Course:  It was felt that she had cholelithiasis and likely had a passed stone.  A follow up LFTs showed improvement of her LFTs.  She had no recurrent pain, and has been able to tolerate food without any problems.  A HIDA scan was performed, and it showed only 11 percent EF, but no reproducible symptoms.   Surgery was consulted, and Dr Lovell SheehanJenkins had seen her, and planned to do elective CCY in follow up outpatient.  He requested that she be discharged on pain meds and antiemetics, and these will be given.  She has been anxious to go home, and will be discharge today.   Procedures:  HIDA.  Consultations:  Surgery with Dr Lovell SheehanJenkins.   Discharge Exam: Filed Vitals:   05/13/15 0612  BP: 152/69  Pulse: 68  Temp: 98.5 F (36.9 C)  Resp: 16    General: well Cardiovascular: S1 S2 Respiratory: clear.   Discharge Instructions   Discharge Instructions    Diet - low sodium heart healthy    Complete by:  As directed      Discharge instructions    Complete by:  As directed   Avoid fatty food.  Increase activity slowly    Complete by:  As directed           Current Discharge Medication List    START taking these medications   Details  HYDROcodone-acetaminophen (NORCO/VICODIN) 5-325 MG per tablet Take 1-2 tablets by mouth every 4 (four) hours as needed for moderate pain. Qty: 30 tablet, Refills: 0     ondansetron (ZOFRAN) 4 MG tablet Take 1 tablet (4 mg total) by mouth every 6 (six) hours as needed for nausea. Qty: 20 tablet, Refills: 0      CONTINUE these medications which have NOT CHANGED   Details  escitalopram (LEXAPRO) 20 MG tablet TAKE 1 TABLET BY MOUTH EVERY DAY Qty: 30 tablet, Refills: 6    lamoTRIgine (LAMICTAL) 200 MG tablet 200 mg daily.  Refills: 5    medroxyPROGESTERone (PROVERA) 10 MG tablet Take 1 tablet daily as instructed Qty: 30 tablet, Refills: 11    ALPRAZolam (XANAX) 0.25 MG tablet Take 0.25 mg by mouth at bedtime as needed for sleep.      STOP taking these medications     hydrochlorothiazide (HYDRODIURIL) 25 MG tablet      ibuprofen (ADVIL,MOTRIN) 200 MG tablet        Allergies  Allergen Reactions  . Prednisone Shortness Of Breath and Swelling      The results of significant diagnostics from this hospitalization (including imaging, microbiology, ancillary and laboratory) are listed below for reference.    Significant Diagnostic Studies: [place] Transvaginal Non-ob  05/08/2015   GYNECOLOGIC SONOGRAM   [name] [name] is a 54 y.o. [id number] postmenapaual for a pelvic sonogram f/u  thickened endometrium .  Uterus                      8.01 x 4.92 x 3.9 cm, wnl  Endometrium          7.8 mm, symmetrical, (previously 6.231mm)  Right ovary             1.9 x 2.0 x 1.2 cm, normal  Left ovary                1.2 x 2.1 x 2.1 cm, normal     Technician Comments: US pelvis, normal anteverted uterus, EEC 7.8 mm (previously 6.501mm),normal  ov's bilat,no free fluid   Karie Chimeramber J Carl 04/19/2015 9:45 AM  Clinical Impression and recommendations:  I have reviewed the sonogram results above. Patient has been treated with progesterone without a withdrawal bleed,  suggesting postmenopausal status Combined with the patient's current clinical course, below are my  impressions and any appropriate recommendations for management based on  the sonographic findings:  1. Slight thickening of endometrium  in a postmenopausal female.  2. Endometrial sampling recommended.  FERGUSON,JOHN V  [place] Pelvis Complete  05/08/2015   GYNECOLOGIC SONOGRAM   [name] [name] is a 54 y.o. [id number] postmenapaual for a pelvic sonogram f/u  thickened endometrium .  Uterus                      8.01 x 4.92 x 3.9 cm, wnl  Endometrium          7.8 mm, symmetrical, (previously 6.351mm)  Right ovary             1.9 x 2.0 x 1.2 cm, normal  Left ovary                1.2 x 2.1 x 2.1 cm, normal  Technician Comments: US pelvis, normal anteverted uterus, EEC 7.8 mm (previously 6.70mm),normal  ov's bilat,no free fluid   Karie Chimera 04/19/2015 9:45 AM  Clinical Impression and recommendations:  I have reviewed the sonogram results above. Patient has been treated with progesterone without a withdrawal bleed,  suggesting postmenopausal status Combined with the patient's current clinical course, below are my  impressions and any appropriate recommendations for management based on  the sonographic findings:  1. Slight thickening of endometrium in a postmenopausal female.  2. Endometrial sampling recommended.  FERGUSON,JOHN V  Nm Hepato W/eject Fract  05/12/2015   CLINICAL DATA:  Abdominal pain, back pain for 2 weeks  EXAM: NUCLEAR MEDICINE HEPATOBILIARY IMAGING WITH GALLBLADDER EF  TECHNIQUE: Sequential images of the abdomen were obtained out to 60 minutes following intravenous administration of radiopharmaceutical. After slow intravenous infusion of micrograms Cholecystokinin, gallbladder ejection fraction was determined.  RADIOPHARMACEUTICALS:  5 mCi Technetium-35m Choletec IV  COMPARISON:  None.  FINDINGS: There is normal uptake of the tracer by the liver. CBD visualized at 10 minutes. Bowel activity noted at 15 minutes. Gallbladder is visualized at 45 minutes. Post CCK gallbladder ejection fraction is only 11%. At 45 min, normal ejection fraction is greater than 40%.  The patient reported no symptoms after CCK administration.  IMPRESSION: No cystic  duct obstruction. No CBD obstruction. Post CCK gallbladder ejection fraction is only 11%. Normal ejection fraction is greater than 40%   Electronically Signed   By: Natasha Mead M.D.   On: 05/12/2015 09:58   US Abdomen Limited Ruq  05/11/2015   CLINICAL DATA:  Severe pain for 1 day. Mild pain for 1 month. Right upper quadrant pain. Nausea, vomiting.  EXAM: US ABDOMEN LIMITED - RIGHT UPPER QUADRANT  COMPARISON:  None.  FINDINGS: Gallbladder:  Within the gallbladder small stones are noted. Gallbladder sludge is present. Gallbladder wall is mildly thickened, 3.2 mm. No pericholecystic fluid. No sonographic Murphy sign.  Common bile duct:  Diameter: 7.8 mm which is mildly prominent though there is no sonographic evidence for choledocholithiasis.  Liver:  No focal lesion identified. Within normal limits in parenchymal echogenicity.  IMPRESSION: 1. Gallstones and sludge. No other ultrasound evidence for acute cholecystitis. 2. Mildly prominent common bile duct without definite choledocholithiasis identified.   Electronically Signed   By: Norva Pavlov M.D.   On: 05/11/2015 08:58    Microbiology: No results found for this or any previous visit (from the past 240 hour(s)).   Labs: Basic Metabolic Panel:  Recent Labs Lab 05/11/15 0355 05/12/15 0643  NA 138 139  K 4.0 3.8  CL 104 108  CO2 25 24  GLUCOSE 146* 88  BUN 14 16  CREATININE 0.78 0.71  CALCIUM 9.0 8.3*   Liver Function Tests:  Recent Labs Lab 05/11/15 0355 05/12/15 0643 05/13/15 0626  AST 853* 219* 78*  ALT 628* 395* 269*  ALKPHOS 250* 203* 205*  BILITOT 2.7* 1.1 0.7  PROT 7.9 6.1* 6.7  ALBUMIN 4.4 3.3* 3.6    Recent Labs Lab 05/11/15 0355  LIPASE 25   No results for input(s): AMMONIA in the last 168 hours. CBC:  Recent Labs Lab 05/11/15 0355 05/12/15 0643  WBC 9.5 5.9  NEUTROABS 8.2*  --   HGB 14.7 12.5  HCT 44.1 39.5  MCV 94.2 97.3  PLT 362 302   Cardiac Enzymes:  Recent Labs Lab 05/11/15 0355   TROPONINI <0.03     Signed:  Loletha Bertini  [facility] 05/13/2015, 1:54 PM

## 2015-05-13 NOTE — Consult Note (Signed)
Reason for Consult: Cholecystitis, cholelithiasis Referring Physician: Hospitalist  [name] [name] is an 54 y.o. female.  HPI: Patient is a 54 year old white female who presented emergency room with worsening right upper quadrant abdominal pain, nausea, and vomiting. She was found on ultrasound the gallbladder to have cholelithiasis with a slightly dilated common bile duct. Liver enzyme tests were significantly elevated. Her lipase was within normal limits. She was admitted to the hospital for further evaluation treatment. She did not have a leukocytosis. She had a hepatobiliary scan which revealed a low gallbladder ejection fraction. There was no obstruction of the common bile duct or cystic duct. Her liver tests have improved. Her bilirubin has normalized. She currently is asymptomatic and would like to have an elective cholecystectomy.  Past Medical History  Diagnosis Date  . Essential hypertension, benign   . Incompetent cervix   . Abnormal Pap smear   . Anxiety   . Mental disorder   . Depression 04/15/2013  . OSA on CPAP   . Bipolar disorder   . PTSD (post-traumatic stress disorder)   . Adult ADHD   . GAD (generalized anxiety disorder)   . Obesity   . Thickened endometrium 05/02/2014  . Arthritis     right knee  . Sleep apnea   . Cholelithiases     04/2015  . ETOH abuse     drinks wine 5 day/week    Past Surgical History  Procedure Laterality Date  . Tonsillectomy    . Dilation and curettage of uterus    . Cervical cerclage    . Knee surgery Right     Family History  Problem Relation Age of Onset  . Hypertension Father   . Cancer Father   . Hypertension Mother   . Pancreatic cancer Mother   . Asthma Mother   . Sleep apnea Mother   . Cancer Mother     pancreas  . Coronary artery disease Maternal Grandfather   . Sleep apnea Brother   . Stomach cancer Paternal Aunt   . Stomach cancer Paternal Uncle   . Colon cancer Neg Hx   . Esophageal cancer Neg Hx   . Rectal  cancer Neg Hx   . Cancer Maternal Aunt     lung  . Stroke Maternal Aunt   . Heart attack Maternal Aunt     Social History:  reports that she quit smoking about 9 years ago. Her smoking use included Cigarettes. She has a 25 pack-year smoking history. She has never used smokeless tobacco. She reports that she drinks about 4.2 oz of alcohol per week. She reports that she does not use illicit drugs.  Allergies:  Allergies  Allergen Reactions  . Prednisone Shortness Of Breath and Swelling    Medications: I have reviewed the patient's current medications.  Results for orders placed or performed during the hospital encounter of 05/11/15 (from the past 48 hour(s))  Comprehensive metabolic panel     Status: Abnormal   Collection Time: 05/12/15  6:43 AM  Result Value Ref Range   Sodium 139 135 - 145 mmol/L   Potassium 3.8 3.5 - 5.1 mmol/L   Chloride 108 101 - 111 mmol/L   CO2 24 22 - 32 mmol/L   Glucose, Bld 88 65 - 99 mg/dL   BUN 16 6 - 20 mg/dL   Creatinine, Ser 0.71 0.44 - 1.00 mg/dL   Calcium 8.3 (L) 8.9 - 10.3 mg/dL   Total Protein 6.1 (L) 6.5 - 8.1 g/dL  Albumin 3.3 (L) 3.5 - 5.0 g/dL   AST 219 (H) 15 - 41 U/L   ALT 395 (H) 14 - 54 U/L   Alkaline Phosphatase 203 (H) 38 - 126 U/L   Total Bilirubin 1.1 0.3 - 1.2 mg/dL   GFR calc non Af Amer >60 >60 mL/min   GFR calc Af Amer >60 >60 mL/min    Comment: (NOTE) The eGFR has been calculated using the CKD EPI equation. This calculation has not been validated in all clinical situations. eGFR's persistently <60 mL/min signify possible Chronic Kidney Disease.    Anion gap 7 5 - 15  CBC     Status: None   Collection Time: 05/12/15  6:43 AM  Result Value Ref Range   WBC 5.9 4.0 - 10.5 K/uL   RBC 4.06 3.87 - 5.11 MIL/uL   Hemoglobin 12.5 12.0 - 15.0 g/dL   HCT 39.5 36.0 - 46.0 %   MCV 97.3 78.0 - 100.0 fL   MCH 30.8 26.0 - 34.0 pg   MCHC 31.6 30.0 - 36.0 g/dL   RDW 13.6 11.5 - 15.5 %   Platelets 302 150 - 400 K/uL  Hepatic  function panel     Status: Abnormal   Collection Time: 05/13/15  6:26 AM  Result Value Ref Range   Total Protein 6.7 6.5 - 8.1 g/dL   Albumin 3.6 3.5 - 5.0 g/dL   AST 78 (H) 15 - 41 U/L   ALT 269 (H) 14 - 54 U/L   Alkaline Phosphatase 205 (H) 38 - 126 U/L   Total Bilirubin 0.7 0.3 - 1.2 mg/dL   Bilirubin, Direct 0.2 0.1 - 0.5 mg/dL   Indirect Bilirubin 0.5 0.3 - 0.9 mg/dL    Nm Hepato W/eject Fract  05/12/2015   CLINICAL DATA:  Abdominal pain, back pain for 2 weeks  EXAM: NUCLEAR MEDICINE HEPATOBILIARY IMAGING WITH GALLBLADDER EF  TECHNIQUE: Sequential images of the abdomen were obtained out to 60 minutes following intravenous administration of radiopharmaceutical. After slow intravenous infusion of micrograms Cholecystokinin, gallbladder ejection fraction was determined.  RADIOPHARMACEUTICALS:  5 mCi Technetium-75mCholetec IV  COMPARISON:  None.  FINDINGS: There is normal uptake of the tracer by the liver. CBD visualized at 10 minutes. Bowel activity noted at 15 minutes. Gallbladder is visualized at 45 minutes. Post CCK gallbladder ejection fraction is only 11%. At 45 min, normal ejection fraction is greater than 40%.  The patient reported no symptoms after CCK administration.  IMPRESSION: No cystic duct obstruction. No CBD obstruction. Post CCK gallbladder ejection fraction is only 11%. Normal ejection fraction is greater than 40%   Electronically Signed   By: LLahoma CrockerM.D.   On: 05/12/2015 09:58    ROS: See chart Blood pressure 152/69, pulse 68, temperature 98.5 F (36.9 C), temperature source Oral, resp. rate 16, height _0  (1.676 m), weight 96.616 kg (213 lb), SpO2 98 %. Physical Exam: Pleasant white female in no acute distress. Abdomen is soft, nontender, nondistended. No hepatosplenomegaly, masses, or hernias identified.  Assessment/Plan: Impression: Cholecystitis, cholelithiasis, resolving Plan: Patient would like to be discharged and have the surgery done as an outpatient,  which is fine with me. I have scheduled her for laparoscopic cholecystectomy with cholangiograms on 05/24/2015. The risks and benefits of the procedure including bleeding, infection, hepatobiliary injury, and the possibility of an open procedure were fully explained to the patient, who gave informed consent. Should be discharged on Percocet and Zofran as she is going to the out of  town for the next week.  Nikholas Geffre A 05/13/2015, 12:11 PM

## 2015-05-17 NOTE — H&P (Signed)
[name] [name] is an 54 y.o. female.   Chief Complaint: Biliary colic, cholecystitis, cholelithiasis HPI: Patient is a 54 year old white female who was recently admitted for right upper quadrant abdominal pain. She was found to have cholecystitis with cholelithiasis. She was discharged and now presents for laparoscopic cholecystectomy with possible cholangiograms.  Past Medical History  Diagnosis Date  . Essential hypertension, benign   . Incompetent cervix   . Abnormal Pap smear   . Anxiety   . Mental disorder   . Depression 04/15/2013  . OSA on CPAP   . Bipolar disorder   . PTSD (post-traumatic stress disorder)   . Adult ADHD   . GAD (generalized anxiety disorder)   . Obesity   . Thickened endometrium 05/02/2014  . Arthritis     right knee  . Sleep apnea   . Cholelithiases     04/2015  . ETOH abuse     drinks wine 5 day/week    Past Surgical History  Procedure Laterality Date  . Tonsillectomy    . Dilation and curettage of uterus    . Cervical cerclage    . Knee surgery Right     Family History  Problem Relation Age of Onset  . Hypertension Father   . Cancer Father   . Hypertension Mother   . Pancreatic cancer Mother   . Asthma Mother   . Sleep apnea Mother   . Cancer Mother     pancreas  . Coronary artery disease Maternal Grandfather   . Sleep apnea Brother   . Stomach cancer Paternal Aunt   . Stomach cancer Paternal Uncle   . Colon cancer Neg Hx   . Esophageal cancer Neg Hx   . Rectal cancer Neg Hx   . Cancer Maternal Aunt     lung  . Stroke Maternal Aunt   . Heart attack Maternal Aunt    Social History:  reports that she quit smoking about 9 years ago. Her smoking use included Cigarettes. She has a 25 pack-year smoking history. She has never used smokeless tobacco. She reports that she drinks about 4.2 oz of alcohol per week. She reports that she does not use illicit drugs.  Allergies:  Allergies  Allergen Reactions  . Prednisone Shortness Of Breath and  Swelling    No prescriptions prior to admission    No results found for this or any previous visit (from the past 48 hour(s)). No results found.  Review of Systems  All other systems reviewed and are negative.   There were no vitals taken for this visit. Physical Exam  Constitutional: She is oriented to person, place, and time. She appears well-developed and well-nourished.  HENT:  Head: Normocephalic and atraumatic.  Neck: Normal range of motion. Neck supple.  Cardiovascular: Normal rate, regular rhythm and normal heart sounds.   Respiratory: Effort normal and breath sounds normal.  GI: Soft. Bowel sounds are normal. She exhibits no distension. There is no tenderness.  Neurological: She is alert and oriented to person, place, and time.  Skin: Skin is warm and dry.     Assessment/Plan Impression: Cholecystitis, cholelithiasis Plan: Will proceed with laparoscopic cholecystectomy with possible cholangiograms. The risks and benefits of the procedure including bleeding, infection, hepatobiliary injury, and the possibility of an open procedure were fully explained to the patient, who gave informed consent.  Tanganyika Bowlds A 05/17/2015, 7:28 AM

## 2015-05-19 NOTE — Patient Instructions (Addendum)
[name] [name]  05/19/2015        Your procedure is scheduled on Wednesday, 05/24/2015  [facility].  Call this number if you have problems the morning of surgery:  [phone]   Remember:  Do not eat food or drink liquids after midnight.  Take these medicines the morning of surgery with A SIP OF WATER lamictal, xanax, lexapro, zofran, norco   Do not wear jewelry, make-up or nail polish.  Do not wear lotions, powders, or perfumes.  You may wear deodorant.  Do not shave 48 hours prior to surgery.  Men may shave face and neck.  Do not bring valuables to the hospital.  [facility] any belongings or valuables.  Contacts, dentures or bridgework may not be worn into surgery.  Leave your suitcase in the car.  After surgery it may be brought to your room.  For patients admitted to the hospital, discharge time will be determined by your treatment team.  Patients discharged the day of surgery will not be allowed to drive home.   Name and phone number of your driver:   family Special instructions:    Please read over the following fact sheets that you were given. Anesthesia Post-op Instructions and Care and Recovery After Surgery      Laparoscopic Cholecystectomy, Care After These instructions give you information on caring for yourself after your procedure. Your doctor may also give you more specific instructions. Call your doctor if you have any problems or questions after your procedure.  HOME CARE  Change your bandages (dressings) as told by your doctor.  Keep the wound dry and clean. Wash the wound gently with soap and water. Pat the wound dry with a clean towel.  Do not take baths, swim, or use hot tubs for 2 weeks, or as told by your doctor.  Only take medicine as told by your doctor.  Eat a normal diet as told by your doctor.  Do not lift anything heavier than 10 pounds (4.5 kg) until your doctor says it is okay.  Do not  play contact sports for 1 week, or as told by your doctor. GET HELP IF:  Your wound is red, puffy (swollen), or painful.  You have yellowish-white fluid (pus) coming from the wound.  You have fluid draining from the wound for more than 1 day.  You have a bad smell coming from the wound.  Your wound breaks open. GET HELP RIGHT AWAY IF:   You have a rash.  You have trouble breathing.  You have chest pain.  You have a fever.  You have pain in the shoulders (shoulder strap areas) that is getting worse.  You feel dizzy or pass out (faint).  You have severe belly (abdominal) pain.  You feel sick to your stomach (nauseous) or throw up (vomit) for more than 1 day. Document Released: 09/24/2008 Document Revised: 10/06/2013 Document Reviewed: 07/28/2013 [facility] Patient Information 2015 [place]. This information is not intended to replace advice given to you by your health care provider. Make sure you discuss any questions you have with your health care provider. Laparoscopic Cholecystectomy Laparoscopic cholecystectomy is surgery to remove the gallbladder. The gallbladder is located in the upper right part of the abdomen, behind the liver. It is a storage sac for bile produced in the liver. Bile aids in the digestion and absorption of fats. Cholecystectomy is often done for inflammation of the gallbladder (cholecystitis). This  condition is usually caused by a buildup of gallstones (cholelithiasis) in your gallbladder. Gallstones can block the flow of bile, resulting in inflammation and pain. In severe cases, emergency surgery may be required. When emergency surgery is not required, you will have time to prepare for the procedure. Laparoscopic surgery is an alternative to open surgery. Laparoscopic surgery has a shorter recovery time. Your common bile duct may also need to be examined during the procedure. If stones are found in the common bile duct, they may be removed. LET [facility] CARE PROVIDER KNOW ABOUT:  Any allergies you have.  All medicines you are taking, including vitamins, herbs, eye drops, creams, and over-the-counter medicines.  Previous problems you or members of your family have had with the use of anesthetics.  Any blood disorders you have.  Previous surgeries you have had.  Medical conditions you have. RISKS AND COMPLICATIONS Generally, this is a safe procedure. However, as with any procedure, complications can occur. Possible complications include:  Infection.  Damage to the common bile duct, nerves, arteries, veins, or other internal organs such as the stomach, liver, or intestines.  Bleeding.  A stone may remain in the common bile duct.  A bile leak from the cyst duct that is clipped when your gallbladder is removed.  The need to convert to open surgery, which requires a larger incision in the abdomen. This may be necessary if your surgeon thinks it is not safe to continue with a laparoscopic procedure. BEFORE THE PROCEDURE  Ask your health care provider about changing or stopping any regular medicines. You will need to stop taking aspirin or blood thinners at least 5 days prior to surgery.  Do not eat or drink anything after midnight the night before surgery.  Let your health care provider know if you develop a cold or other infectious problem before surgery. PROCEDURE   You will be given medicine to make you sleep through the procedure (general anesthetic). A breathing tube will be placed in your mouth.  When you are asleep, your surgeon will make several small cuts (incisions) in your abdomen.  A thin, lighted tube with a tiny camera on the end (laparoscope) is inserted through one of the small incisions. The camera on the laparoscope sends a picture to a TV screen in the operating room. This gives the surgeon a good view inside your abdomen.  A gas will be pumped into your abdomen. This expands your abdomen so that the  surgeon has more room to perform the surgery.  Other tools needed for the procedure are inserted through the other incisions. The gallbladder is removed through one of the incisions.  After the removal of your gallbladder, the incisions will be closed with stitches, staples, or skin glue. AFTER THE PROCEDURE  You will be taken to a recovery area where your progress will be checked often.  You may be allowed to go home the same day if your pain is controlled and you can tolerate liquids. Document Released: 12/16/2005 Document Revised: 10/06/2013 Document Reviewed: 07/28/2013 [facility] Patient Information 2015 [place], [place]. This information is not intended to replace advice given to you by your health care provider. Make sure you discuss any questions you have with your health care provider. General Anesthesia, Care After Refer to this sheet in the next few weeks. These instructions provide you with information on caring for yourself after your procedure. Your health care provider may also give you more specific instructions. Your treatment has been planned according  to current medical practices, but problems sometimes occur. Call your health care provider if you have any problems or questions after your procedure. WHAT TO EXPECT AFTER THE PROCEDURE After the procedure, it is typical to experience:  Sleepiness.  Nausea and vomiting. HOME CARE INSTRUCTIONS  For the first 24 hours after general anesthesia:  Have a responsible person with you.  Do not drive a car. If you are alone, do not take public transportation.  Do not drink alcohol.  Do not take medicine that has not been prescribed by your health care provider.  Do not sign important papers or make important decisions.  You may resume a normal diet and activities as directed by your health care provider.  Change bandages (dressings) as directed.  If you have questions or problems that seem related to general anesthesia, call  the hospital and ask for the anesthetist or anesthesiologist on call. SEEK MEDICAL CARE IF:  You have nausea and vomiting that continue the day after anesthesia.  You develop a rash. SEEK IMMEDIATE MEDICAL CARE IF:   You have difficulty breathing.  You have chest pain.  You have any allergic problems. Document Released: 03/24/2001 Document Revised: 12/21/2013 Document Reviewed: 07/01/2013 [facility] Patient Information 2015 [place], [place]. This information is not intended to replace advice given to you by your health care provider. Make sure you discuss any questions you have with your health care provider.

## 2015-05-22 ENCOUNTER — Encounter (HOSPITAL_COMMUNITY)
Admission: RE | Admit: 2015-05-22 | Discharge: 2015-05-22 | Disposition: A | Discharge status: Home / Self Care | Payer: BLUE CROSS/BLUE SHIELD | Source: Ambulatory Visit | Attending: General Surgery | Admitting: General Surgery

## 2015-05-22 ENCOUNTER — Encounter (HOSPITAL_COMMUNITY): Payer: Self-pay

## 2015-05-24 ENCOUNTER — Ambulatory Visit (HOSPITAL_COMMUNITY): Payer: BLUE CROSS/BLUE SHIELD | Admitting: Anesthesiology

## 2015-05-24 ENCOUNTER — Ambulatory Visit (HOSPITAL_COMMUNITY)
Admission: RE | Admit: 2015-05-24 | Discharge: 2015-05-24 | Disposition: A | Discharge status: Home / Self Care | Payer: BLUE CROSS/BLUE SHIELD | Source: Ambulatory Visit | Attending: General Surgery | Admitting: General Surgery

## 2015-05-24 ENCOUNTER — Encounter (HOSPITAL_COMMUNITY): Payer: Self-pay | Admitting: Anesthesiology

## 2015-05-24 ENCOUNTER — Ambulatory Visit (HOSPITAL_COMMUNITY): Payer: BLUE CROSS/BLUE SHIELD

## 2015-05-24 ENCOUNTER — Encounter (HOSPITAL_COMMUNITY)
Admission: RE | Disposition: A | Discharge status: Home / Self Care | Payer: Self-pay | Source: Ambulatory Visit | Attending: General Surgery

## 2015-05-24 DIAGNOSIS — Z9989 Dependence on other enabling machines and devices: Secondary | ICD-10-CM | POA: Insufficient documentation

## 2015-05-24 DIAGNOSIS — F909 Attention-deficit hyperactivity disorder, unspecified type: Secondary | ICD-10-CM | POA: Insufficient documentation

## 2015-05-24 DIAGNOSIS — I10 Essential (primary) hypertension: Secondary | ICD-10-CM | POA: Diagnosis not present

## 2015-05-24 DIAGNOSIS — G4733 Obstructive sleep apnea (adult) (pediatric): Secondary | ICD-10-CM | POA: Insufficient documentation

## 2015-05-24 DIAGNOSIS — Z87891 Personal history of nicotine dependence: Secondary | ICD-10-CM | POA: Insufficient documentation

## 2015-05-24 DIAGNOSIS — F319 Bipolar disorder, unspecified: Secondary | ICD-10-CM | POA: Diagnosis not present

## 2015-05-24 DIAGNOSIS — K8020 Calculus of gallbladder without cholecystitis without obstruction: Secondary | ICD-10-CM

## 2015-05-24 DIAGNOSIS — K8010 Calculus of gallbladder with chronic cholecystitis without obstruction: Secondary | ICD-10-CM | POA: Diagnosis present

## 2015-05-24 DIAGNOSIS — F4310 Post-traumatic stress disorder, unspecified: Secondary | ICD-10-CM | POA: Insufficient documentation

## 2015-05-24 DIAGNOSIS — Z6834 Body mass index (BMI) 34.0-34.9, adult: Secondary | ICD-10-CM | POA: Diagnosis not present

## 2015-05-24 DIAGNOSIS — Z79899 Other long term (current) drug therapy: Secondary | ICD-10-CM | POA: Diagnosis not present

## 2015-05-24 DIAGNOSIS — E669 Obesity, unspecified: Secondary | ICD-10-CM | POA: Diagnosis not present

## 2015-05-24 HISTORY — PX: CHOLECYSTECTOMY: SHX55

## 2015-05-24 SURGERY — LAPAROSCOPIC CHOLECYSTECTOMY WITH INTRAOPERATIVE CHOLANGIOGRAM
Anesthesia: General | Site: Abdomen

## 2015-05-24 MED ORDER — GLYCOPYRROLATE 0.2 MG/ML IJ SOLN
INTRAMUSCULAR | Status: AC
  Filled 2015-05-24: qty 2

## 2015-05-24 MED ORDER — LACTATED RINGERS IV SOLN
INTRAVENOUS | Status: DC
  Administered 2015-05-24 (×2): via INTRAVENOUS

## 2015-05-24 MED ORDER — PROPOFOL 10 MG/ML IV BOLUS
INTRAVENOUS | Status: DC | PRN
  Administered 2015-05-24: 140 mg via INTRAVENOUS

## 2015-05-24 MED ORDER — CIPROFLOXACIN IN D5W 400 MG/200ML IV SOLN
INTRAVENOUS | Status: AC
  Filled 2015-05-24: qty 200

## 2015-05-24 MED ORDER — SODIUM CHLORIDE 0.9 % IJ SOLN
INTRAMUSCULAR | Status: AC
  Filled 2015-05-24: qty 10

## 2015-05-24 MED ORDER — MIDAZOLAM HCL 2 MG/2ML IJ SOLN
INTRAMUSCULAR | Status: AC
  Filled 2015-05-24: qty 2

## 2015-05-24 MED ORDER — ONDANSETRON HCL 4 MG/2ML IJ SOLN
INTRAMUSCULAR | Status: AC
  Filled 2015-05-24: qty 2

## 2015-05-24 MED ORDER — HEMOSTATIC AGENTS (NO CHARGE) OPTIME
TOPICAL | Status: DC | PRN
  Administered 2015-05-24: 1 via TOPICAL

## 2015-05-24 MED ORDER — MIDAZOLAM HCL 5 MG/5ML IJ SOLN
INTRAMUSCULAR | Status: DC | PRN
  Administered 2015-05-24: 2 mg via INTRAVENOUS

## 2015-05-24 MED ORDER — NEOSTIGMINE METHYLSULFATE 10 MG/10ML IV SOLN
INTRAVENOUS | Status: AC
  Filled 2015-05-24: qty 1

## 2015-05-24 MED ORDER — ONDANSETRON HCL 4 MG/2ML IJ SOLN
4.0000 mg | Freq: Once | INTRAMUSCULAR | Status: AC | PRN
  Administered 2015-05-24: 4 mg via INTRAVENOUS

## 2015-05-24 MED ORDER — CHLORHEXIDINE GLUCONATE 4 % EX LIQD: 1.0000 "application " | Freq: Once | CUTANEOUS | Status: DC

## 2015-05-24 MED ORDER — FENTANYL CITRATE (PF) 100 MCG/2ML IJ SOLN
INTRAMUSCULAR | Status: AC
  Filled 2015-05-24: qty 2

## 2015-05-24 MED ORDER — GLYCOPYRROLATE 0.2 MG/ML IJ SOLN
INTRAMUSCULAR | Status: DC | PRN
  Administered 2015-05-24: .5 mg via INTRAVENOUS
  Administered 2015-05-24: 0.6 mg via INTRAVENOUS

## 2015-05-24 MED ORDER — ONDANSETRON HCL 4 MG/2ML IJ SOLN
4.0000 mg | Freq: Once | INTRAMUSCULAR | Status: AC
  Administered 2015-05-24: 4 mg via INTRAVENOUS

## 2015-05-24 MED ORDER — CIPROFLOXACIN IN D5W 400 MG/200ML IV SOLN
INTRAVENOUS | Status: DC | PRN
  Administered 2015-05-24: 400 mg via INTRAVENOUS

## 2015-05-24 MED ORDER — BUPIVACAINE HCL (PF) 0.5 % IJ SOLN
INTRAMUSCULAR | Status: AC
  Filled 2015-05-24: qty 30

## 2015-05-24 MED ORDER — KETOROLAC TROMETHAMINE 30 MG/ML IJ SOLN
30.0000 mg | Freq: Once | INTRAMUSCULAR | Status: AC
  Administered 2015-05-24: 30 mg via INTRAVENOUS

## 2015-05-24 MED ORDER — FENTANYL CITRATE (PF) 100 MCG/2ML IJ SOLN
25.0000 ug | INTRAMUSCULAR | Status: DC | PRN
  Administered 2015-05-24 (×4): 50 ug via INTRAVENOUS

## 2015-05-24 MED ORDER — MIDAZOLAM HCL 2 MG/2ML IJ SOLN
1.0000 mg | INTRAMUSCULAR | Status: DC | PRN
Start: 2015-05-24 — End: 2015-05-24
  Administered 2015-05-24: 2 mg via INTRAVENOUS

## 2015-05-24 MED ORDER — NEOSTIGMINE METHYLSULFATE 10 MG/10ML IV SOLN
INTRAVENOUS | Status: DC | PRN
  Administered 2015-05-24: 3 mg via INTRAVENOUS

## 2015-05-24 MED ORDER — CEFAZOLIN SODIUM-DEXTROSE 2-3 GM-% IV SOLR
INTRAVENOUS | Status: AC
  Filled 2015-05-24: qty 50

## 2015-05-24 MED ORDER — IOHEXOL 350 MG/ML SOLN
INTRAVENOUS | Status: DC | PRN
  Administered 2015-05-24: 10 mL via INTRATHECAL

## 2015-05-24 MED ORDER — ATROPINE SULFATE 0.4 MG/ML IJ SOLN
INTRAMUSCULAR | Status: AC
  Filled 2015-05-24: qty 2

## 2015-05-24 MED ORDER — FENTANYL CITRATE (PF) 250 MCG/5ML IJ SOLN
INTRAMUSCULAR | Status: AC
  Filled 2015-05-24: qty 5

## 2015-05-24 MED ORDER — FENTANYL CITRATE (PF) 100 MCG/2ML IJ SOLN
INTRAMUSCULAR | Status: DC | PRN
  Administered 2015-05-24: 50 ug via INTRAVENOUS
  Administered 2015-05-24: 100 ug via INTRAVENOUS
  Administered 2015-05-24: 25 ug via INTRAVENOUS
  Administered 2015-05-24: 50 ug via INTRAVENOUS
  Administered 2015-05-24: 25 ug via INTRAVENOUS

## 2015-05-24 MED ORDER — LIDOCAINE HCL (PF) 1 % IJ SOLN
INTRAMUSCULAR | Status: AC
  Filled 2015-05-24: qty 10

## 2015-05-24 MED ORDER — SODIUM CHLORIDE 0.9 % IR SOLN
Status: DC | PRN
  Administered 2015-05-24: 1000 mL

## 2015-05-24 MED ORDER — BUPIVACAINE HCL 0.5 % IJ SOLN
INTRAMUSCULAR | Status: DC | PRN
  Administered 2015-05-24: 10 mL

## 2015-05-24 MED ORDER — CIPROFLOXACIN IN D5W 400 MG/200ML IV SOLN: 400.0000 mg | INTRAVENOUS | Status: DC

## 2015-05-24 MED ORDER — LIDOCAINE HCL 1 % IJ SOLN
INTRAMUSCULAR | Status: DC | PRN
  Administered 2015-05-24: 30 mg via INTRADERMAL

## 2015-05-24 MED ORDER — ROCURONIUM BROMIDE 100 MG/10ML IV SOLN
INTRAVENOUS | Status: DC | PRN
  Administered 2015-05-24: 40 mg via INTRAVENOUS

## 2015-05-24 MED ORDER — EPHEDRINE SULFATE 50 MG/ML IJ SOLN
INTRAMUSCULAR | Status: AC
  Filled 2015-05-24: qty 1

## 2015-05-24 MED ORDER — KETOROLAC TROMETHAMINE 30 MG/ML IJ SOLN
INTRAMUSCULAR | Status: AC
  Filled 2015-05-24: qty 1

## 2015-05-24 MED ORDER — POVIDONE-IODINE 10 % EX OINT
TOPICAL_OINTMENT | CUTANEOUS | Status: AC
  Filled 2015-05-24: qty 1

## 2015-05-24 MED ORDER — HYDROCODONE-ACETAMINOPHEN 5-325 MG PO TABS: 1.0000 | ORAL_TABLET | ORAL | Status: DC | PRN

## 2015-05-24 MED ORDER — CEFAZOLIN SODIUM-DEXTROSE 2-3 GM-% IV SOLR
INTRAVENOUS | Status: DC | PRN
  Administered 2015-05-24: 2 g via INTRAVENOUS

## 2015-05-24 SURGICAL SUPPLY — 50 items
APPLIER CLIP LAPSCP 10X32 DD (CLIP) ×3 IMPLANT
BAG HAMPER (MISCELLANEOUS) ×3 IMPLANT
BAG SPEC RTRVL LRG 6X4 10 (ENDOMECHANICALS) ×1
CATH CHOLANGIOGRAM 4.5FR (CATHETERS) ×3 IMPLANT
CHLORAPREP W/TINT 26ML (MISCELLANEOUS) ×3 IMPLANT
CLOTH BEACON ORANGE TIMEOUT ST (SAFETY) ×3 IMPLANT
COVER LIGHT HANDLE STERIS (MISCELLANEOUS) ×6 IMPLANT
COVER MAYO STAND XLG (DRAPE) ×3 IMPLANT
DECANTER SPIKE VIAL GLASS SM (MISCELLANEOUS) ×3 IMPLANT
DISSECTOR BLUNT TIP ENDO 5MM (MISCELLANEOUS) IMPLANT
DRAPE C-ARM FOLDED MOBILE STRL (DRAPES) ×3 IMPLANT
ELECT REM PT RETURN 9FT ADLT (ELECTROSURGICAL) ×3
ELECTRODE REM PT RTRN 9FT ADLT (ELECTROSURGICAL) ×1 IMPLANT
FILTER SMOKE EVAC LAPAROSHD (FILTER) ×3 IMPLANT
FORMALIN 10 PREFIL 120ML (MISCELLANEOUS) ×3 IMPLANT
GLOVE BIOGEL PI IND STRL 7.0 (GLOVE) IMPLANT
GLOVE BIOGEL PI INDICATOR 7.0 (GLOVE) ×4
GLOVE ECLIPSE 6.5 STRL STRAW (GLOVE) ×2 IMPLANT
GLOVE EXAM NITRILE PF MED BLUE (GLOVE) ×2 IMPLANT
GLOVE SS BIOGEL STRL SZ 6.5 (GLOVE) IMPLANT
GLOVE SUPERSENSE BIOGEL SZ 6.5 (GLOVE) ×2
GLOVE SURG SS PI 7.5 STRL IVOR (GLOVE) ×6 IMPLANT
GOWN STRL REUS W/ TWL XL LVL3 (GOWN DISPOSABLE) ×1 IMPLANT
GOWN STRL REUS W/TWL LRG LVL3 (GOWN DISPOSABLE) ×9 IMPLANT
GOWN STRL REUS W/TWL XL LVL3 (GOWN DISPOSABLE) ×3
HEMOSTAT SNOW SURGICEL 2X4 (HEMOSTASIS) ×3 IMPLANT
INST SET LAPROSCOPIC AP (KITS) ×3 IMPLANT
KIT ROOM TURNOVER APOR (KITS) ×3 IMPLANT
MANIFOLD NEPTUNE II (INSTRUMENTS) ×3 IMPLANT
NEEDLE INSUFFLATION 120MM (ENDOMECHANICALS) ×3 IMPLANT
NS IRRIG 1000ML POUR BTL (IV SOLUTION) ×3 IMPLANT
PACK LAP CHOLE LZT030E (CUSTOM PROCEDURE TRAY) ×3 IMPLANT
PAD ARMBOARD 7.5X6 YLW CONV (MISCELLANEOUS) ×3 IMPLANT
POUCH SPECIMEN RETRIEVAL 10MM (ENDOMECHANICALS) ×3 IMPLANT
SET BASIN LINEN APH (SET/KITS/TRAYS/PACK) ×3 IMPLANT
SET TUBE IRRIG SUCTION NO TIP (IRRIGATION / IRRIGATOR) IMPLANT
SLEEVE ENDOPATH XCEL 5M (ENDOMECHANICALS) ×3 IMPLANT
SPONGE GAUZE 2X2 8PLY STER LF (GAUZE/BANDAGES/DRESSINGS) ×4
SPONGE GAUZE 2X2 8PLY STRL LF (GAUZE/BANDAGES/DRESSINGS) ×8 IMPLANT
STAPLER VISISTAT (STAPLE) ×3 IMPLANT
SUT VICRYL 0 UR6 27IN ABS (SUTURE) ×3 IMPLANT
SYR 20CC LL (SYRINGE) ×3 IMPLANT
SYR 30ML LL (SYRINGE) ×3 IMPLANT
TAPE CLOTH SURG 4X10 WHT LF (GAUZE/BANDAGES/DRESSINGS) ×2 IMPLANT
TROCAR ENDO BLADELESS 11MM (ENDOMECHANICALS) ×3 IMPLANT
TROCAR XCEL NON-BLD 5MMX100MML (ENDOMECHANICALS) ×3 IMPLANT
TROCAR XCEL UNIV SLVE 11M 100M (ENDOMECHANICALS) ×3 IMPLANT
TUBING INSUFFLATION (TUBING) ×3 IMPLANT
WARMER LAPAROSCOPE (MISCELLANEOUS) ×3 IMPLANT
YANKAUER SUCT 12FT TUBE ARGYLE (SUCTIONS) ×3 IMPLANT

## 2015-05-24 NOTE — Discharge Instructions (Signed)

## 2015-05-24 NOTE — Interval H&P Note (Signed)
History and Physical Interval Note:  05/24/2015 9:28 AM  [name] [name]  has presented today for surgery, with the diagnosis of cholecystitis, cholelithiasis  The various methods of treatment have been discussed with the patient and family. After consideration of risks, benefits and other options for treatment, the patient has consented to  Procedure(s): LAPAROSCOPIC CHOLECYSTECTOMY WITH INTRAOPERATIVE CHOLANGIOGRAM (N/A) as a surgical intervention .  The patient's history has been reviewed, patient examined, no change in status, stable for surgery.  I have reviewed the patient's chart and labs.  Questions were answered to the patient's satisfaction.     Franky MachoJENKINS,Maxi Carreras A

## 2015-05-24 NOTE — Op Note (Signed)
Patient:  [name] [name]  DOB:  Jan 27, 1961  MRN:  [id number]   Preop Diagnosis:  Cholecystitis, cholelithiasis  Postop Diagnosis:  Same  Procedure:  Laparoscopic cholecystectomy with cholangiograms  Surgeon:  Franky MachoMark Vlad Mayberry, M.D.  Anes:  Gen. endotracheal  Indications:  Patient is a 54 year old white female presents with cholecystitis secondary to cholelithiasis. The risks and benefits of the procedure including bleeding, infection, hepatobiliary injury, and the possibility of an open procedure were fully explained to the patient, who gave informed consent.  Procedure note:  The patient is placed the supine position. After induction of general endotracheal anesthesia, the abdomen was prepped and draped using usual sterile technique with DuraPrep. Surgical site confirmation was performed.  A supraumbilical incision was made down to the fascia. A Veress needle was introduced into the abdominal cavity and confirmation of placement was done using the saline drop test. The abdomen was then insufflated to 16 mmHg pressure. An 11 mm trocar was introduced into the abdominal cavity under direct visualization without difficulty. The patient was placed in reverse Trendelenburg position and an additional 11 mm trocar was placed the epigastric region and 5 mm trochars were placed the right upper quadrant and right flank regions. Liver was inspected and noted to be within normal limits. The gallbladder was retracted in a dynamic fashion in order to expose the triangle of Calot. The cystic duct was first identified. Its juncture to the infundibulum was fully identified. A single Endo Clip was placed proximally on the cystic duct. An incision was made into the cystic duct and a cholangiocatheter was inserted. Under digital fluoroscopy, cholangiograms were performed. The dye flowed freely into the duodenum. The common duct did appear mildly dilated. At the juncture of the hepatic ducts, 2-3 small lucencies were seen.  It was difficult to certain whether this was air bubbles versus small stones. They were not amenable to removal laparoscopically due to their position in the hepatobiliary tree. This was discussed with radiology. Even if they are stones, they should pass freely into the duodenum without problem. The system was flushed with saline. The cholangiocatheter was removed and multiple endoclips were placed distally on the cystic duct and cystic duct was divided. The cystic artery was likewise ligated and divided. The gallbladder was freed away from the gallbladder fossa using Bovie electrocautery. The gallbladder was delivered to the epigastric trocar site using an Endo Catch bag. The gallbladder fossa was inspected and no abnormal bleeding or bile leakage was noted. Surgicel is placed the gallbladder fossa. All fluid and air were then evacuated from the abdominal cavity prior to removal of the trochars.  All wounds were irrigated with normal saline. All wounds were injected with 0.5% Sensorcaine. The supraumbilical fascia was reapproximated using an 0 Vicryl interrupted suture. All skin incisions were closed using staples. Betadine ointment and dry sterile dressings were applied.  All tape and needle counts were correct the end of the procedure. Patient was extubated in the operating room and transferred to PACU in stable condition.  Complications:  None  EBL:  Minimal  Specimen:  Gallbladder

## 2015-05-24 NOTE — Anesthesia Postprocedure Evaluation (Signed)
  Anesthesia Post-op Note  Patient: [name] [name]  Procedure(s) Performed: Procedure(s): LAPAROSCOPIC CHOLECYSTECTOMY WITH INTRAOPERATIVE CHOLANGIOGRAM (N/A)  Patient Location: PACU  Anesthesia Type:General  Level of Consciousness: awake, alert , oriented and patient cooperative  Airway and Oxygen Therapy: Patient Spontanous Breathing  Post-op Pain: 3 /10, mild  Post-op Assessment: Post-op Vital signs reviewed, Patient's Cardiovascular Status Stable, Respiratory Function Stable, Patent Airway and No signs of Nausea or vomiting  Post-op Vital Signs: Reviewed and stable  Last Vitals:  Filed Vitals:   05/24/15 0945  BP: 163/74  Pulse:   Temp:   Resp: 22    Complications: No apparent anesthesia complications

## 2015-05-24 NOTE — Anesthesia Preprocedure Evaluation (Signed)
Anesthesia Evaluation  Patient identified by MRN, date of birth, ID band Patient awake    Reviewed: Allergy & Precautions, NPO status , Patient's Chart, lab work & pertinent test results  Airway Mallampati: I  TM Distance: >3 FB     Dental  (+) Teeth Intact, Loose, Dental Advisory Given,    Pulmonary sleep apnea , former smoker,  breath sounds clear to auscultation        Cardiovascular hypertension, Pt. on medications Rhythm:Regular Rate:Normal     Neuro/Psych PSYCHIATRIC DISORDERS (PTSD) Anxiety Depression Bipolar Disorder    GI/Hepatic negative GI ROS,   Endo/Other    Renal/GU      Musculoskeletal   Abdominal   Peds  Hematology   Anesthesia Other Findings   Reproductive/Obstetrics                             Anesthesia Physical Anesthesia Plan  ASA: II  Anesthesia Plan: General   Post-op Pain Management:    Induction: Intravenous  Airway Management Planned: Oral ETT  Additional Equipment:   Intra-op Plan:   Post-operative Plan: Extubation in OR  Informed Consent: I have reviewed the patients History and Physical, chart, labs and discussed the procedure including the risks, benefits and alternatives for the proposed anesthesia with the patient or authorized representative who has indicated his/her understanding and acceptance.     Plan Discussed with:   Anesthesia Plan Comments:         Anesthesia Quick Evaluation

## 2015-05-24 NOTE — Anesthesia Procedure Notes (Signed)
Procedure Name: Intubation Date/Time: 05/24/2015 10:19 AM Performed by: Despina HiddenIDACAVAGE, Jayanna Kroeger J Pre-anesthesia Checklist: Patient being monitored, Suction available, Emergency Drugs available and Patient identified Patient Re-evaluated:Patient Re-evaluated prior to inductionOxygen Delivery Method: Circle system utilized Preoxygenation: Pre-oxygenation with 100% oxygen Intubation Type: IV induction Ventilation: Mask ventilation without difficulty and Oral airway inserted - appropriate to patient size Laryngoscope Size: Mac and 3 Grade View: Grade I Tube type: Oral Tube size: 7.0 mm Number of attempts: 1 Airway Equipment and Method: Stylet Placement Confirmation: ETT inserted through vocal cords under direct vision,  positive ETCO2 and breath sounds checked- equal and bilateral Secured at: 22 cm Tube secured with: Tape Dental Injury: Teeth and Oropharynx as per pre-operative assessment

## 2015-05-24 NOTE — Transfer of Care (Signed)
Immediate Anesthesia Transfer of Care Note  Patient: [name] [name]  Procedure(s) Performed: Procedure(s): LAPAROSCOPIC CHOLECYSTECTOMY WITH INTRAOPERATIVE CHOLANGIOGRAM (N/A)  Patient Location: PACU  Anesthesia Type:General  Level of Consciousness: awake, alert , oriented and patient cooperative  Airway & Oxygen Therapy: Patient Spontanous Breathing and Patient connected to face mask oxygen  Post-op Assessment: Report given to RN, Post -op Vital signs reviewed and stable and Patient moving all extremities  Post vital signs: Reviewed and stable  Last Vitals:  Filed Vitals:   05/24/15 0945  BP: 163/74  Pulse:   Temp:   Resp: 22    Complications: No apparent anesthesia complications

## 2015-05-25 ENCOUNTER — Encounter (HOSPITAL_COMMUNITY): Payer: Self-pay | Admitting: General Surgery

## 2015-06-11 ENCOUNTER — Other Ambulatory Visit: Payer: Self-pay | Admitting: Adult Health

## 2015-11-22 ENCOUNTER — Other Ambulatory Visit: Payer: Self-pay | Admitting: Adult Health

## 2016-03-18 ENCOUNTER — Telehealth: Payer: Self-pay | Admitting: Adult Health

## 2016-03-18 NOTE — Telephone Encounter (Signed)
BP is up 189/101 this weekend, is taking HCTZ 25 mg ,make appt tomorrow for BP check

## 2016-03-19 ENCOUNTER — Ambulatory Visit (INDEPENDENT_AMBULATORY_CARE_PROVIDER_SITE_OTHER): Payer: BLUE CROSS/BLUE SHIELD | Admitting: Adult Health

## 2016-03-19 ENCOUNTER — Encounter: Payer: Self-pay | Admitting: Adult Health

## 2016-03-19 VITALS — BP 152/88 | HR 68 | Ht 66.0 in | Wt 237.0 lb

## 2016-03-19 DIAGNOSIS — H6690 Otitis media, unspecified, unspecified ear: Secondary | ICD-10-CM

## 2016-03-19 DIAGNOSIS — I10 Essential (primary) hypertension: Secondary | ICD-10-CM | POA: Diagnosis not present

## 2016-03-19 DIAGNOSIS — R42 Dizziness and giddiness: Secondary | ICD-10-CM

## 2016-03-19 HISTORY — DX: Otitis media, unspecified, unspecified ear: H66.90

## 2016-03-19 HISTORY — DX: Dizziness and giddiness: R42

## 2016-03-19 MED ORDER — LISINOPRIL 10 MG PO TABS: 10.0000 mg | ORAL_TABLET | Freq: Every day | ORAL | Status: DC

## 2016-03-19 MED ORDER — DIAZEPAM 2 MG PO TABS: ORAL_TABLET | ORAL | Status: DC

## 2016-03-19 MED ORDER — AZITHROMYCIN 250 MG PO TABS: ORAL_TABLET | ORAL | Status: DC

## 2016-03-19 NOTE — Patient Instructions (Signed)
Vertigo Vertigo means you feel like you or your surroundings are moving when they are not. Vertigo can be dangerous if it occurs when you are at work, driving, or performing difficult activities.  CAUSES  Vertigo occurs when there is a conflict of signals sent to your brain from the visual and sensory systems in your body. There are many different causes of vertigo, including:  Infections, especially in the inner ear.  A bad reaction to a drug or misuse of alcohol and medicines.  Withdrawal from drugs or alcohol.  Rapidly changing positions, such as lying down or rolling over in bed.  A migraine headache.  Decreased blood flow to the brain.  Increased pressure in the brain from a head injury, infection, tumor, or bleeding. SYMPTOMS  You may feel as though the world is spinning around or you are falling to the ground. Because your balance is upset, vertigo can cause nausea and vomiting. You may have involuntary eye movements (nystagmus). DIAGNOSIS  Vertigo is usually diagnosed by physical exam. If the cause of your vertigo is unknown, your caregiver may perform imaging tests, such as an MRI scan (magnetic resonance imaging). TREATMENT  Most cases of vertigo resolve on their own, without treatment. Depending on the cause, your caregiver may prescribe certain medicines. If your vertigo is related to body position issues, your caregiver may recommend movements or procedures to correct the problem. In rare cases, if your vertigo is caused by certain inner ear problems, you may need surgery. HOME CARE INSTRUCTIONS   Follow your caregiver's instructions.  Avoid driving.  Avoid operating heavy machinery.  Avoid performing any tasks that would be dangerous to you or others during a vertigo episode.  Tell your caregiver if you notice that certain medicines seem to be causing your vertigo. Some of the medicines used to treat vertigo episodes can actually make them worse in some people. SEEK  IMMEDIATE MEDICAL CARE IF:   Your medicines do not relieve your vertigo or are making it worse.  You develop problems with talking, walking, weakness, or using your arms, hands, or legs.  You develop severe headaches.  Your nausea or vomiting continues or gets worse.  You develop visual changes.  A family member notices behavioral changes.  Your condition gets worse. MAKE SURE YOU:  Understand these instructions.  Will watch your condition.  Will get help right away if you are not doing well or get worse.   This information is not intended to replace advice given to you by your health care provider. Make sure you discuss any questions you have with your health care provider.   Document Released: 09/25/2005 Document Revised: 03/09/2012 Document Reviewed: 04/10/2015 Elsevier Interactive Patient Education [employer] Follow up in 3 days

## 2016-03-19 NOTE — Progress Notes (Signed)
Subjective:     Patient ID: [name] [name], female   DOB: [birth year], 55 y.o.   MRN: [id number]  HPI [name] is a 55 year old white female, divorced, in complaining of dizzy and lightheaded today, BP was up Friday.Had argument with ex husband Friday and was upset.She went to eye doctor today and he would not see her tole her to come here.Her appointment was this afternoon but we worked her in.   Review of Systems Patient denies any headaches, hearing loss, fatigue, blurred vision, shortness of breath, chest pain, abdominal pain, problems with bowel movements, urination, or intercourse. No joint pain or mood swings.See HPI for positives. Reviewed past medical,surgical, social and family history. Reviewed medications and allergies.     Objective:   Physical Exam BP 152/88 mmHg  Pulse 68  Ht 5\' 6"  (1.676 m)  Wt 237 lb (107.502 kg)  BMI 38.27 kg/m2   BP when laying down 140/80 in left arm, Skin warm and dry. Neck: mid line trachea, normal thyroid, good ROM, no lymphadenopathy noted. Lungs: clear to ausculation bilaterally. Cardiovascular: regular rate and rhythm.+[place], CN 2-12 intact, right ear is red, left ear is clear, TM pearly gray with light reflex, throat is clear, no redness or swelling.She had some nausea when changed positions quickly.Told to turn head slowly and go home and rest after taking the valium. Will see her back in 3 days to check BP again.   Assessment:     Vertigo Hypertension Ear infection     Plan:     Rx valium 2 mg #30 take 1 tid prn vertigo, no refills Rx lisinopril 10 mg #30 take 1 daily with 3 refills Continue hydrodiuril  Rx Z pack Follow up in 3 days Review handout on vertigo

## 2016-03-22 ENCOUNTER — Ambulatory Visit (INDEPENDENT_AMBULATORY_CARE_PROVIDER_SITE_OTHER): Payer: BLUE CROSS/BLUE SHIELD | Admitting: Adult Health

## 2016-03-22 ENCOUNTER — Encounter: Payer: Self-pay | Admitting: Adult Health

## 2016-03-22 VITALS — BP 126/70 | HR 74 | Ht 66.0 in | Wt 239.0 lb

## 2016-03-22 DIAGNOSIS — R42 Dizziness and giddiness: Secondary | ICD-10-CM | POA: Diagnosis not present

## 2016-03-22 DIAGNOSIS — I10 Essential (primary) hypertension: Secondary | ICD-10-CM | POA: Diagnosis not present

## 2016-03-22 NOTE — Progress Notes (Signed)
Subjective:     Patient ID: [name] [name], female   DOB: 05-02-1961, 55 y.o.   MRN: [id number]  HPI [name] [name] is a 55 year old white female back in follow up of dizzy.lightheaded and BP up and she says she feels much better, still a little dizzy if turns too fast.She is not happy with weight.  Review of Systems Patient denies any headaches, hearing loss, fatigue, blurred vision, shortness of breath, chest pain, abdominal pain, problems with bowel movements, urination, or intercourse. No joint pain, or depression at this time, is manic at times See HPV for positives. Reviewed past medical,surgical, social and family history. Reviewed medications and allergies.     Objective:   Physical Exam BP 126/70 mmHg  Pulse 74  Ht 5\' 6"  (1.676 m)  Wt 239 lb (108.41 kg)  BMI 38.59 kg/m2 Skin warm and dry.  Lungs: clear to ausculation bilaterally. Cardiovascular: regular rate and rhythm.   Discussed trying Whole 30, and think research sleeve surgery.  Assessment:     Vertigo Hypertension     Plan:     Continue meds Follow up in 6 weeks for physical

## 2016-03-22 NOTE — Patient Instructions (Addendum)
Follow up in 6 weeks for physical Continue meds

## 2016-04-23 ENCOUNTER — Other Ambulatory Visit: Payer: Self-pay | Admitting: Family Medicine

## 2016-04-23 DIAGNOSIS — Z1231 Encounter for screening mammogram for malignant neoplasm of breast: Secondary | ICD-10-CM

## 2016-05-01 ENCOUNTER — Ambulatory Visit (INDEPENDENT_AMBULATORY_CARE_PROVIDER_SITE_OTHER): Payer: BLUE CROSS/BLUE SHIELD | Admitting: Adult Health

## 2016-05-01 ENCOUNTER — Encounter: Payer: Self-pay | Admitting: Adult Health

## 2016-05-01 VITALS — BP 130/72 | HR 66 | Ht 65.0 in | Wt 236.0 lb

## 2016-05-01 DIAGNOSIS — Z01419 Encounter for gynecological examination (general) (routine) without abnormal findings: Secondary | ICD-10-CM

## 2016-05-01 DIAGNOSIS — Z1211 Encounter for screening for malignant neoplasm of colon: Secondary | ICD-10-CM

## 2016-05-01 DIAGNOSIS — I10 Essential (primary) hypertension: Secondary | ICD-10-CM

## 2016-05-01 DIAGNOSIS — F329 Major depressive disorder, single episode, unspecified: Secondary | ICD-10-CM

## 2016-05-01 DIAGNOSIS — F32A Depression, unspecified: Secondary | ICD-10-CM

## 2016-05-01 DIAGNOSIS — F419 Anxiety disorder, unspecified: Secondary | ICD-10-CM

## 2016-05-01 DIAGNOSIS — Z01411 Encounter for gynecological examination (general) (routine) with abnormal findings: Secondary | ICD-10-CM

## 2016-05-01 LAB — HEMOCCULT GUIAC POC 1CARD (OFFICE): Fecal Occult Blood, POC: NEGATIVE

## 2016-05-01 MED ORDER — ESCITALOPRAM OXALATE 20 MG PO TABS: 20.0000 mg | ORAL_TABLET | Freq: Every day | ORAL | Status: DC

## 2016-05-01 MED ORDER — HYDROCHLOROTHIAZIDE 25 MG PO TABS: 25.0000 mg | ORAL_TABLET | Freq: Every day | ORAL | Status: DC

## 2016-05-01 MED ORDER — LISINOPRIL 10 MG PO TABS: 10.0000 mg | ORAL_TABLET | Freq: Every day | ORAL | Status: DC

## 2016-05-01 NOTE — Patient Instructions (Signed)
Pap and physical in 1 year Mammogram yearly Labs with PCP Colonoscopy at [id number]

## 2016-05-01 NOTE — Progress Notes (Signed)
Patient ID: [name] [name], female   DOB: 04-Mar-1961, 55 y.o.   MRN: [id number] History of Present Illness: [name] [name] is a 55 year old white female, divorced in for a well woman gyn exam, she had a normal pap with negative HPV 04/18/14.She is doing well, has a new grand baby and seems happy.She sees Dr Harwood SinkBraden about every 3-4 months and Robbie LisBelmont is her PCP, and she had labs there recently.   Current Medications, Allergies, Past Medical History, Past Surgical History, Family History and Social History were reviewed in [facility] electronic medical record.     Review of Systems:  Patient denies any headaches, hearing loss, blurred vision, shortness of breath, chest pain, abdominal pain, problems with bowel movements, urination, or intercourse. No joint pain or mood swings.   Physical Exam:BP 130/72 mmHg  Pulse 66  Ht 5\' 5"  (1.651 m)  Wt 236 lb (107.049 kg)  BMI 39.27 kg/m2 General:  Well developed, well nourished, no acute distress Skin:  Warm and dry Neck:  Midline trachea, normal thyroid, good ROM, no lymphadenopathy Lungs; Clear to auscultation bilaterally Breast:  No dominant palpable mass, retraction, or nipple discharge Cardiovascular: Regular rate and rhythm Abdomen:  Soft, non tender, no hepatosplenomegaly Pelvic:  External genitalia is normal in appearance, no lesions.  The vagina is normal in appearance. Urethra has no lesions or masses. The cervix is bulbous.  Uterus is felt to be normal size, shape, and contour.  No adnexal masses or tenderness noted.Bladder is non tender, no masses felt. Rectal: Good sphincter tone, no polyps, or hemorrhoids felt.  Hemoccult negative. Extremities/musculoskeletal:  No swelling or varicosities noted, no clubbing or cyanosis Psych:  No mood changes, alert and cooperative,seems happy   Impression: Well woman gyn exam no pap Anxiety  Depression Hypertension     Plan: Refilled lexapro 20 mg #30 take 1 daily with 11 refills Refilled  hydrodiuril 25 mg #30 take 1 daily with 11 refills Refilled lisinopril 10 mg #30 take 1 daily with 11 refills Labs with PCP Mammogram yearly Pap and physical in 1 year Colonoscopy at 8456

## 2016-05-06 ENCOUNTER — Ambulatory Visit (HOSPITAL_COMMUNITY): Payer: BLUE CROSS/BLUE SHIELD

## 2016-07-18 ENCOUNTER — Other Ambulatory Visit: Payer: Self-pay | Admitting: Adult Health

## 2017-04-30 ENCOUNTER — Telehealth: Payer: Self-pay | Admitting: Adult Health

## 2017-04-30 NOTE — Telephone Encounter (Signed)
Physical appt moved to 5/7 at 1:30, she needs letter for bariatric surgery

## 2017-05-05 ENCOUNTER — Ambulatory Visit (INDEPENDENT_AMBULATORY_CARE_PROVIDER_SITE_OTHER): Payer: BLUE CROSS/BLUE SHIELD | Admitting: Adult Health

## 2017-05-05 ENCOUNTER — Encounter: Payer: Self-pay | Admitting: Adult Health

## 2017-05-05 ENCOUNTER — Other Ambulatory Visit (HOSPITAL_COMMUNITY)
Admission: RE | Admit: 2017-05-05 | Discharge: 2017-05-05 | Disposition: A | Discharge status: Home / Self Care | Payer: BLUE CROSS/BLUE SHIELD | Source: Ambulatory Visit | Attending: Adult Health | Admitting: Adult Health

## 2017-05-05 VITALS — BP 126/68 | HR 76 | Ht 64.75 in | Wt 246.0 lb

## 2017-05-05 DIAGNOSIS — Z01419 Encounter for gynecological examination (general) (routine) without abnormal findings: Secondary | ICD-10-CM | POA: Diagnosis not present

## 2017-05-05 DIAGNOSIS — F329 Major depressive disorder, single episode, unspecified: Secondary | ICD-10-CM

## 2017-05-05 DIAGNOSIS — Z6841 Body Mass Index (BMI) 40.0 and over, adult: Secondary | ICD-10-CM

## 2017-05-05 DIAGNOSIS — Z1211 Encounter for screening for malignant neoplasm of colon: Secondary | ICD-10-CM | POA: Diagnosis not present

## 2017-05-05 DIAGNOSIS — I10 Essential (primary) hypertension: Secondary | ICD-10-CM

## 2017-05-05 DIAGNOSIS — E7800 Pure hypercholesterolemia, unspecified: Secondary | ICD-10-CM

## 2017-05-05 DIAGNOSIS — Z1212 Encounter for screening for malignant neoplasm of rectum: Secondary | ICD-10-CM | POA: Diagnosis not present

## 2017-05-05 DIAGNOSIS — R9389 Abnormal findings on diagnostic imaging of other specified body structures: Secondary | ICD-10-CM

## 2017-05-05 DIAGNOSIS — F32A Depression, unspecified: Secondary | ICD-10-CM

## 2017-05-05 LAB — HEMOCCULT GUIAC POC 1CARD (OFFICE): Fecal Occult Blood, POC: NEGATIVE

## 2017-05-05 MED ORDER — LISINOPRIL 10 MG PO TABS: 10.0000 mg | ORAL_TABLET | Freq: Every day | ORAL | 3 refills | Status: DC

## 2017-05-05 MED ORDER — ESCITALOPRAM OXALATE 20 MG PO TABS: 10.0000 mg | ORAL_TABLET | Freq: Every day | ORAL | 3 refills | Status: DC

## 2017-05-05 MED ORDER — HYDROCHLOROTHIAZIDE 25 MG PO TABS: 25.0000 mg | ORAL_TABLET | Freq: Every day | ORAL | 3 refills | Status: DC

## 2017-05-05 NOTE — Progress Notes (Signed)
Patient ID: [name] [name], female   DOB: March 02, 1961, 56 y.o.   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 PCP is [name] [name].    Current Medications, Allergies, Past Medical History, Past Surgical History, Family History and Social History were reviewed in [name] [name] electronic medical record.     Review of Systems: Patient denies any headaches, hearing loss, fatigue, blurred vision, shortness of breath, chest pain, abdominal pain, problems with bowel movements, urination, or intercourse. No current mood swings.+weight gain(trying weight watchers now),+joint aches esp right knee She has sleep apnea and uses C-pap.   Physical Exam:BP 126/68 (BP Location: Left Arm, Patient Position: Sitting, Cuff Size: Large)   Pulse 76   Ht 5' 4.75" (1.645 m)   Wt 246 lb (111.6 kg)   BMI 41.25 kg/m  General:  Well developed, well nourished, no acute distress Skin:  Warm and dry Neck:  Midline trachea, normal thyroid, good ROM, no lymphadenopathy Lungs; Clear to auscultation bilaterally Breast:  No dominant palpable mass, retraction, or nipple discharge Cardiovascular: Regular rate and rhythm Abdomen:  Soft, non tender, no hepatosplenomegaly Pelvic:  External genitalia is normal in appearance, no lesions.  The vagina is normal in appearance. Urethra has no lesions or masses. The cervix is bulbous,pap with HPV performed.  Uterus is felt to be normal size, shape, and contour.  No adnexal masses or tenderness noted.Bladder is non tender, no masses felt. Rectal: Good sphincter tone, no polyps, or hemorrhoids felt.  Hemoccult negative. Extremities/musculoskeletal:  No swelling or varicosities noted, no  clubbing or cyanosis Psych:  No mood changes, alert and cooperative,seems happy PHQ 9 declined has at [facility]orrowKaren Jones,NP office and is on meds,denies any suicidal ideations   Impression: 1. Pap smear, as part of routine gynecological examination   2. Screening for colorectal cancer   3. Essential hypertension, benign   4. Depression, unspecified depression type   5. Body mass index 40.0-44.9, adult (HCC)   6. Increased endometrial stripe thickness   7. Elevated cholesterol       Plan:  Check CBC,CMP,TSH and lipids.hepatitis C antibody  Return in 1-2 weeks for F/U US on endometrial lining Physical in 1 year Pap in 3 if normal Mammogram yearly Colonoscopy per GI Will do referral letter for bariatric surgery  Refilled lisinopril 10 mg ,1 daily,lexapro 20 mg ,1/2 tab daily,  and hydrodiuril 25 mg 1 daily, for 1 year

## 2017-05-07 LAB — CYTOLOGY - PAP
DIAGNOSIS: NEGATIVE
HPV (WINDOPATH): NOT DETECTED

## 2017-05-13 ENCOUNTER — Other Ambulatory Visit: Payer: BLUE CROSS/BLUE SHIELD

## 2017-05-13 ENCOUNTER — Other Ambulatory Visit: Payer: BLUE CROSS/BLUE SHIELD | Admitting: Adult Health

## 2017-05-13 ENCOUNTER — Ambulatory Visit (INDEPENDENT_AMBULATORY_CARE_PROVIDER_SITE_OTHER): Payer: BLUE CROSS/BLUE SHIELD

## 2017-05-13 DIAGNOSIS — R938 Abnormal findings on diagnostic imaging of other specified body structures: Secondary | ICD-10-CM | POA: Diagnosis not present

## 2017-05-13 DIAGNOSIS — R9389 Abnormal findings on diagnostic imaging of other specified body structures: Secondary | ICD-10-CM

## 2017-05-13 NOTE — Progress Notes (Signed)
PELVIC US TA/TV: homogeneous anteverted uterus,wnl,thickened endometrium 6.9 mm,normal ovaries bilat (limited view),no free fluid,no pain during ultrasound

## 2017-05-19 ENCOUNTER — Other Ambulatory Visit (HOSPITAL_COMMUNITY): Payer: Self-pay | Admitting: Surgery

## 2017-05-19 ENCOUNTER — Telehealth: Payer: Self-pay | Admitting: Adult Health

## 2017-05-19 MED ORDER — MEDROXYPROGESTERONE ACETATE 10 MG PO TABS
ORAL_TABLET | ORAL | 4 refills | Status: DC
Start: 2017-05-19 — End: 2018-06-15

## 2017-05-19 NOTE — Telephone Encounter (Signed)
Pt getting  labs in am, aware that US showed slightly thickened endometrium, needs to cycle every 3 months with provera, appt made for Thursday to discuss labs and weight.

## 2017-05-20 ENCOUNTER — Encounter: Payer: BLUE CROSS/BLUE SHIELD | Attending: Surgery | Admitting: Registered"

## 2017-05-20 ENCOUNTER — Encounter: Payer: Self-pay | Admitting: Registered"

## 2017-05-20 DIAGNOSIS — E669 Obesity, unspecified: Secondary | ICD-10-CM

## 2017-05-20 DIAGNOSIS — Z713 Dietary counseling and surveillance: Secondary | ICD-10-CM | POA: Diagnosis not present

## 2017-05-20 NOTE — Progress Notes (Signed)
Pre-Op Assessment Visit:  Pre-Operative Sleeve Gastrectomy Surgery  Medical Nutrition Therapy:  Appt start time: 2:48  End time:  3:50  Patient was seen on 05/20/2017 for Pre-Operative Nutrition Assessment. Assessment and [facility] coordinator on 05/20/2017.   Pt expectation of surgery: reduce medications and sleep apnea, decrease weight off knee, increase energy, increase self-esteem  Pt expectation of Dietitian: healthy food options and how to stay on track  Start weight at [facility]: 246.3 BMI: 40.99   Pt is talkavitve. Pt states she sometimes she forgets to eat. Pt states that she's manic and has OCD. Pt reports her weakness is any form of potatoes.   Per insurance, pt needs 0 SWL visits prior to surgery.    24 hr Dietary Recall: First Meal: 2 chicken strips w/ honey mustard Snack: english peas with cheese Second Meal: 1 1/2 chicken strips Snack: ravioli Third Meal: broil chicken, green beans, pinto beans or steak, salad, and 1/2 potato Snack: none Beverages: diet ginger ale, diet coke/pepsi, water   Encouraged to engage in 150 minutes of moderate physical activity including cardiovascular and weight baring weekly  Handouts given during visit include:  . Pre-Op Goals . Bariatric Surgery Protein Shakes . Vitamin and Mineral Recommendations  During the appointment today the following Pre-Op Goals were reviewed with the patient: . Maintain or lose weight as instructed by your surgeon . Make healthy food choices . Begin to limit portion sizes . Limited concentrated sugars and fried foods . Keep fat/sugar in the single digits per serving on          food labels . Practice CHEWING your food  (aim for 30 chews per bite or until applesauce consistency) . Practice not drinking 15 minutes before, during, and 30 minutes after each meal/snack . Avoid all carbonated beverages  . Avoid/limit caffeinated beverages   . Avoid all sugar-sweetened beverages . Consume 3 meals per day; eat every 3-5 hours . Make a list of non-food related activities . Aim for 64-100 ounces of FLUID daily  . Aim for at least 60-80 grams of PROTEIN daily . Look for a liquid protein source that contain ?15 g protein and ?5 g carbohydrate  (ex: shakes, drinks, shots) . Physical activity is an important part of a healthy lifestyle so keep it moving!  Follow diet recommendations listed below Energy and Macronutrient Recommendations: Calories: 1600 Carbohydrate: 180 Protein: 120 Fat: 44  Demonstrated degree of understanding via:  Teach Back   Teaching Method Utilized:  Visual Auditory Hands on  Barriers to learning/adherence to lifestyle change: none  Patient to call the [facility] to enroll in Pre-Op and Post-Op Nutrition Education when surgery date is scheduled.

## 2017-05-20 NOTE — Patient Instructions (Addendum)
-   Increase physical activity. Look into water aerobics class on Tuesdays, walking at least 15 min/day 5 days a week

## 2017-05-21 ENCOUNTER — Other Ambulatory Visit: Payer: Self-pay | Admitting: Adult Health

## 2017-05-22 ENCOUNTER — Ambulatory Visit (INDEPENDENT_AMBULATORY_CARE_PROVIDER_SITE_OTHER): Payer: BLUE CROSS/BLUE SHIELD | Admitting: Adult Health

## 2017-05-22 ENCOUNTER — Encounter: Payer: Self-pay | Admitting: Adult Health

## 2017-05-22 VITALS — BP 140/70 | HR 78 | Ht 65.2 in | Wt 246.0 lb

## 2017-05-22 DIAGNOSIS — Z6841 Body Mass Index (BMI) 40.0 and over, adult: Secondary | ICD-10-CM

## 2017-05-22 DIAGNOSIS — R9389 Abnormal findings on diagnostic imaging of other specified body structures: Secondary | ICD-10-CM

## 2017-05-22 DIAGNOSIS — R938 Abnormal findings on diagnostic imaging of other specified body structures: Secondary | ICD-10-CM

## 2017-05-22 NOTE — Progress Notes (Signed)
Subjective:     Patient ID: [name] [name], female   DOB: 31-Aug-1961, 56 y.o.   MRN: [id number]  HPI [name] [name] is a 56 year old white female in to discuss US in detail and to discuss weight loss surgery process.   Review of Systems Can't lose weight Reviewed past medical,surgical, social and family history. Reviewed medications and allergies.     Objective:   Physical Exam BP 140/70 (BP Location: Left Arm, Patient Position: Sitting, Cuff Size: Large)   Pulse 78   Ht 5' 5.2" (1.656 m)   Wt 246 lb (111.6 kg)   BMI 40.69 kg/m Talk only, US showed thickened endometrium 6.9 mm and Dr Despina HiddenEure recommends restating cyclical progesterone every 3 months, til no bleeding for 1 year.And talked about her weight, is on weight watchers but not losing, has began process with [facility].     Assessment:     1. Increased endometrial stripe thickness   2. Body mass index 40.0-44.9, adult (HCC)       Plan:     Take provera as dicussed,has already been ordered  Follow up prn Continue with sleeve surgery

## 2017-05-23 ENCOUNTER — Other Ambulatory Visit (HOSPITAL_COMMUNITY): Payer: Self-pay | Admitting: Respiratory Therapy

## 2017-05-23 DIAGNOSIS — Z01810 Encounter for preprocedural cardiovascular examination: Secondary | ICD-10-CM

## 2017-05-24 LAB — CBC
HEMATOCRIT: 41.1 % (ref 34.0–46.6)
HEMOGLOBIN: 13.9 g/dL (ref 11.1–15.9)
MCH: 30.9 pg (ref 26.6–33.0)
MCHC: 33.8 g/dL (ref 31.5–35.7)
MCV: 91 fL (ref 79–97)
Platelets: 340 10*3/uL (ref 150–379)
RBC: 4.5 x10E6/uL (ref 3.77–5.28)
RDW: 14.5 % (ref 12.3–15.4)
WBC: 8 10*3/uL (ref 3.4–10.8)

## 2017-05-24 LAB — HEPATITIS C ANTIBODY

## 2017-05-27 ENCOUNTER — Other Ambulatory Visit: Payer: Self-pay | Admitting: Adult Health

## 2017-05-27 ENCOUNTER — Ambulatory Visit (HOSPITAL_COMMUNITY)
Admission: RE | Admit: 2017-05-27 | Discharge: 2017-05-27 | Disposition: A | Discharge status: Home / Self Care | Payer: BLUE CROSS/BLUE SHIELD | Source: Ambulatory Visit | Attending: Surgery | Admitting: Surgery

## 2017-05-27 ENCOUNTER — Other Ambulatory Visit: Payer: Self-pay

## 2017-05-27 ENCOUNTER — Encounter (HOSPITAL_COMMUNITY): Payer: Self-pay

## 2017-05-27 DIAGNOSIS — Z1231 Encounter for screening mammogram for malignant neoplasm of breast: Secondary | ICD-10-CM

## 2017-05-27 DIAGNOSIS — Z01810 Encounter for preprocedural cardiovascular examination: Secondary | ICD-10-CM | POA: Insufficient documentation

## 2017-05-27 DIAGNOSIS — Z9049 Acquired absence of other specified parts of digestive tract: Secondary | ICD-10-CM | POA: Diagnosis not present

## 2017-05-27 DIAGNOSIS — Z01818 Encounter for other preprocedural examination: Secondary | ICD-10-CM | POA: Insufficient documentation

## 2017-05-28 ENCOUNTER — Encounter: Payer: BLUE CROSS/BLUE SHIELD | Admitting: Skilled Nursing Facility1

## 2017-05-28 ENCOUNTER — Encounter: Payer: Self-pay | Admitting: Skilled Nursing Facility1

## 2017-05-28 DIAGNOSIS — E669 Obesity, unspecified: Secondary | ICD-10-CM

## 2017-05-28 DIAGNOSIS — Z713 Dietary counseling and surveillance: Secondary | ICD-10-CM | POA: Diagnosis not present

## 2017-05-28 NOTE — Progress Notes (Signed)
  Pre-Operative Nutrition Class:  Appt start time: 3010   End time:  1830.  Patient was seen on 05/28/17 for Pre-Operative Bariatric Surgery Education at [facility].   Surgery date:  Surgery type:  Start weight at [facility]: 246.3 lb Weight today: 246 lb  TANITA  BODY COMP RESULTS     BMI (kg/m^2)    Fat Mass (lbs)    Fat Free Mass (lbs)    Total Body Water (lbs)    Samples given per MNT protocol. Patient educated on appropriate usage: Bariatric Fusion Iron with Vitamin C  Lot # [id number] Exp:03/2019  Opurity Multivitamin Lot # [id number] Exp: 03/2018  Celebrate Vitamins Calcium Citrate Lot #[id number] Exp: 04/2018  Premier Protein Lot #[id number] Exp: 11/01/2017  The following the learning objectives were met by the patient during this course:  Identify Pre-Op Dietary Goals and will begin 2 weeks pre-operatively  Identify appropriate sources of fluids and proteins   State protein recommendations and appropriate sources pre and post-operatively  Identify Post-Operative Dietary Goals and will follow for 2 weeks post-operatively  Identify appropriate multivitamin and calcium sources  Describe the need for physical activity post-operatively and will follow MD recommendations  State when to call healthcare provider regarding medication questions or post-operative complications  Handouts given during class include:  Pre-Op Bariatric Surgery Diet Handout  Protein Shake Handout  Post-Op Bariatric Surgery Nutrition Handout  BELT Program Information Flyer  Support Group Information Flyer  [facility] Bariatric Supplements Price List  Follow-Up Plan: Patient will follow-up at [facility] 2 weeks post operatively for diet advancement per MD.

## 2017-05-29 ENCOUNTER — Ambulatory Visit (HOSPITAL_COMMUNITY)
Admission: RE | Admit: 2017-05-29 | Discharge: 2017-05-29 | Disposition: A | Discharge status: Home / Self Care | Payer: BLUE CROSS/BLUE SHIELD | Source: Ambulatory Visit | Attending: Surgery | Admitting: Surgery

## 2017-06-03 ENCOUNTER — Ambulatory Visit (HOSPITAL_COMMUNITY): Payer: BLUE CROSS/BLUE SHIELD

## 2017-06-03 ENCOUNTER — Telehealth: Payer: Self-pay | Admitting: Adult Health

## 2017-06-03 NOTE — Telephone Encounter (Signed)
Pt needs forms for weigh loss surgery completed

## 2017-06-05 ENCOUNTER — Telehealth: Payer: Self-pay | Admitting: Adult Health

## 2017-06-05 NOTE — Telephone Encounter (Signed)
Pt aware that 2 forms sent

## 2017-06-11 ENCOUNTER — Telehealth: Payer: Self-pay | Admitting: Adult Health

## 2017-06-11 NOTE — Telephone Encounter (Signed)
LMOVM returning call 

## 2017-06-11 NOTE — Telephone Encounter (Signed)
Pt called stating that she would like a call back from Jennifer, Pt did not state the reason why. Please contact pt °

## 2017-06-11 NOTE — Telephone Encounter (Signed)
Patient called back stating she did not need a cal back from [facility]enn[facility].

## 2017-06-20 ENCOUNTER — Ambulatory Visit (INDEPENDENT_AMBULATORY_CARE_PROVIDER_SITE_OTHER): Payer: BLUE CROSS/BLUE SHIELD | Admitting: Pulmonary Disease

## 2017-06-20 ENCOUNTER — Encounter: Payer: Self-pay | Admitting: Pulmonary Disease

## 2017-06-20 DIAGNOSIS — Z6841 Body Mass Index (BMI) 40.0 and over, adult: Secondary | ICD-10-CM

## 2017-06-20 DIAGNOSIS — G4733 Obstructive sleep apnea (adult) (pediatric): Secondary | ICD-10-CM

## 2017-06-20 NOTE — Patient Instructions (Signed)
Send in the chip to DME and we can reviewed download on your machine. CPAP supplies will be renewed for a year including new mask.  Good luck with gastric sleeve surgery -let us know if your weight comes down to 210 pounds and we can consider repeating her sleep study

## 2017-06-20 NOTE — Progress Notes (Signed)
Subjective:    Patient ID: [name] [name], female    DOB: 10/11/1961, 56 y.o.   MRN: [id number]  HPI  56 year old woman with obstructive sleep apnea who is seen by my partner Dr. Marilu Favrelarence in the past, last seen 08/2014, presents to reestablish care for OSA. This was diagnosed by home sleep study done in 2013 and she was placed on auto CPAP with nasal mask with good improvement in her daytime somnolence and fatigue. Download from her auto CPAP showed average pressure of 12 cm. Her weight was 220 pounds at the time of her sleep study, she has gained about 25 pounds to her current weight of 245.  She alternates using nasal mask and nasal pillows but has not had her supplies changed for a long time. She is contemplating gastric sleeve surgery and is at the last data for evaluation she has hypertension controlled on 2 medications. She takes a mood stabilizer medication for bipolar disorder and an antidepressant. She had a nervous breakdown in 2015 and went through divorce. She is now on disability.  Epworth sleepiness score is 5. Bedtime is between 9 and 10 PM, sleep latency is 15 minutes, she sleeps on her left side or on her back with one pillow, reports mask leak causing 3-4 nocturnal awakenings and is out of bed at 7 AM feeling refreshed without dryness of mouth or headaches. She reports good compliance with her CPAP machine and uses it at least -6 hours every night  Significant tests/ events reviewed  HST 2013:  AHI 14/hr. - 220 lbs Auto 2013:  Optimal pressure 12cm.   Past Medical History:  Diagnosis Date  . Abnormal Pap smear   . Adult ADHD   . Anxiety   . Arthritis    right knee  . Bipolar disorder (HCC)   . Cholelithiases    04/2015  . Depression 04/15/2013  . Ear infection 03/19/2016  . Essential hypertension, benign   . ETOH abuse    drinks wine 5 day/week  . GAD (generalized anxiety disorder)   . Incompetent cervix   . Mental disorder   . Obesity   . OSA on CPAP   . PTSD  (post-traumatic stress disorder)   . Sleep apnea   . Thickened endometrium 05/02/2014  . Vaginal Pap smear, abnormal   . Vertigo 03/19/2016   Past Surgical History:  Procedure Laterality Date  . CERVICAL CERCLAGE    . CHOLECYSTECTOMY N/A 05/24/2015   Procedure: LAPAROSCOPIC CHOLECYSTECTOMY WITH INTRAOPERATIVE CHOLANGIOGRAM;  Surgeon: Franky MachoMark Jenkins Md, MD;  Location: [facility];  Service: General;  Laterality: N/A;  . DILATION AND CURETTAGE OF UTERUS    . KNEE SURGERY Right   . TONSILLECTOMY      No Active Allergies  Social History   Social History  . Marital status: Legally Separated    Spouse name: Karie Georgesim [name]  . Number of children: Y  . Years of education: N/A   Occupational History  . [employer]   Social History Main Topics  . Smoking status: Former Smoker    Packs/day: 1.00    Years: 25.00    Types: Cigarettes    Quit date: 12/30/2005  . Smokeless tobacco: Never Used  . Alcohol use 4.2 oz/week    7 Glasses of wine per week     Comment: One glass of wine occ  . Drug use: No  . Sexual activity: Yes    Birth control/ protection: Post-menopausal  Other Topics Concern  . Not on file   Social History Narrative  . No narrative on file      Family History  Problem Relation Age of Onset  . Hypertension Father   . Cancer Father   . Hypertension Mother   . Pancreatic cancer Mother   . Asthma Mother   . Sleep apnea Mother   . Cancer Mother        pancreas  . Coronary artery disease Maternal Grandfather   . Sleep apnea Brother   . Stomach cancer Paternal Aunt   . Stomach cancer Paternal Uncle   . Cancer Maternal Aunt        lung  . Stroke Maternal Aunt   . Heart attack Maternal Aunt   . Diabetes Other   . Colon cancer Neg Hx   . Esophageal cancer Neg Hx   . Rectal cancer Neg Hx      Review of Systems Constitutional: negative for anorexia, fevers and sweats  Eyes: negative for irritation, redness and visual  disturbance  Ears, nose, mouth, throat, and face: negative for earaches, epistaxis, nasal congestion and sore throat  Respiratory: negative for cough, dyspnea on exertion, sputum and wheezing  Cardiovascular: negative for chest pain, dyspnea, lower extremity edema, orthopnea, palpitations and syncope  Gastrointestinal: negative for abdominal pain, constipation, diarrhea, melena, nausea and vomiting  Genitourinary:negative for dysuria, frequency and hematuria  Hematologic/lymphatic: negative for bleeding, easy bruising and lymphadenopathy  Musculoskeletal:negative for arthralgias, muscle weakness and stiff joints  Neurological: negative for coordination problems, gait problems, headaches and weakness  Endocrine: negative for diabetic symptoms including polydipsia, polyuria and weight loss      Objective:   Physical Exam  Gen. Pleasant, obese, in no distress, normal affect ENT - no lesions, no post nasal drip, class 2-3 airway Neck: No JVD, no thyromegaly, no carotid bruits Lungs: no use of accessory muscles, no dullness to percussion, decreased without rales or rhonchi  Cardiovascular: Rhythm regular, heart sounds  normal, no murmurs or gallops, no peripheral edema Abdomen: soft and non-tender, no hepatosplenomegaly, BS normal. Musculoskeletal: No deformities, no cyanosis or clubbing Neuro:  alert, non focal, no tremors       Assessment & Plan:

## 2017-06-20 NOTE — Addendum Note (Signed)
Addended by: Maurene CapesPOTTS, Kenyatta Keidel M on: 06/20/2017 09:29 AM   Modules accepted: Orders

## 2017-06-20 NOTE — Assessment & Plan Note (Signed)
Good luck with gastric sleeve surgery -let us know if your weight comes down to 210 pounds and we can consider repeating her sleep study

## 2017-06-20 NOTE — Progress Notes (Signed)
   Subjective:    Patient ID: [name] [name], female    DOB: 31-Aug-1961, 56 y.o.   MRN: [id number]  HPI    Review of Systems  Constitutional: Negative for fever and unexpected weight change.  HENT: Negative for congestion, dental problem, ear pain, nosebleeds, postnasal drip, rhinorrhea, sinus pressure, sneezing, sore throat and trouble swallowing.   Eyes: Negative for redness and itching.  Respiratory: Positive for shortness of breath. Negative for cough, chest tightness and wheezing.   Cardiovascular: Negative for palpitations and leg swelling.  Gastrointestinal: Negative for nausea and vomiting.  Genitourinary: Negative for dysuria.  Musculoskeletal: Positive for joint swelling.  Skin: Negative for rash.  Allergic/Immunologic: Negative.  Negative for environmental allergies, food allergies and immunocompromised state.  Neurological: Negative for headaches.  Hematological: Does not bruise/bleed easily.  Psychiatric/Behavioral: Negative for dysphoric mood. The patient is nervous/anxious.        Objective:   Physical Exam        Assessment & Plan:

## 2017-06-20 NOTE — Assessment & Plan Note (Signed)
Send in the chip to DME and we can reviewed download on your machine. CPAP supplies will be renewed for a year including new mask.  Weight loss encouraged, compliance with goal of at least 4-6 hrs every night is the expectation. Advised against medications with sedative side effects Cautioned against driving when sleepy - understanding that sleepiness will vary on a day to day basis

## 2017-06-23 ENCOUNTER — Other Ambulatory Visit: Payer: Self-pay | Admitting: Adult Health

## 2017-06-27 ENCOUNTER — Telehealth: Payer: Self-pay | Admitting: Adult Health

## 2017-06-27 NOTE — Telephone Encounter (Signed)
Pt called requesting to speak to [name] [name] about a surgery that she is trying to have. Informed pt that I would send the request for [name] [name] to return her call.

## 2017-06-27 NOTE — Telephone Encounter (Signed)
Stating that she would like a call back from [place], Patient did not state the reason why. Please contact pt

## 2017-06-30 NOTE — Telephone Encounter (Signed)
Left message I called 

## 2017-12-27 ENCOUNTER — Other Ambulatory Visit: Payer: Self-pay | Admitting: Adult Health

## 2018-01-15 ENCOUNTER — Ambulatory Visit (HOSPITAL_COMMUNITY)
Admission: RE | Admit: 2018-01-15 | Discharge: 2018-01-15 | Disposition: A | Discharge status: Home / Self Care | Payer: Commercial Managed Care - PPO | Source: Ambulatory Visit | Attending: Adult Health | Admitting: Adult Health

## 2018-01-15 DIAGNOSIS — Z1231 Encounter for screening mammogram for malignant neoplasm of breast: Secondary | ICD-10-CM

## 2018-01-26 ENCOUNTER — Other Ambulatory Visit: Payer: Self-pay | Admitting: Orthopaedic Surgery

## 2018-01-28 ENCOUNTER — Other Ambulatory Visit: Payer: Self-pay | Admitting: Orthopaedic Surgery

## 2018-02-03 ENCOUNTER — Encounter (HOSPITAL_COMMUNITY): Payer: Self-pay

## 2018-02-03 ENCOUNTER — Encounter (HOSPITAL_COMMUNITY)
Admission: RE | Admit: 2018-02-03 | Discharge: 2018-02-03 | Disposition: A | Discharge status: Home / Self Care | Payer: Commercial Managed Care - PPO | Source: Ambulatory Visit | Attending: Orthopaedic Surgery | Admitting: Orthopaedic Surgery

## 2018-02-03 DIAGNOSIS — M1711 Unilateral primary osteoarthritis, right knee: Secondary | ICD-10-CM | POA: Diagnosis not present

## 2018-02-03 DIAGNOSIS — Z01812 Encounter for preprocedural laboratory examination: Secondary | ICD-10-CM | POA: Insufficient documentation

## 2018-02-03 LAB — URINALYSIS, ROUTINE W REFLEX MICROSCOPIC
BILIRUBIN URINE: NEGATIVE
GLUCOSE, UA: NEGATIVE mg/dL
HGB URINE DIPSTICK: NEGATIVE
KETONES UR: NEGATIVE mg/dL
Leukocytes, UA: NEGATIVE
Nitrite: NEGATIVE
PROTEIN: NEGATIVE mg/dL
Specific Gravity, Urine: 1.018 (ref 1.005–1.030)
pH: 6 (ref 5.0–8.0)

## 2018-02-03 LAB — CBC WITH DIFFERENTIAL/PLATELET
Basophils Absolute: 0 10*3/uL (ref 0.0–0.1)
Basophils Relative: 1 %
EOS ABS: 0.2 10*3/uL (ref 0.0–0.7)
Eosinophils Relative: 2 %
HCT: 42.1 % (ref 36.0–46.0)
HEMOGLOBIN: 13.8 g/dL (ref 12.0–15.0)
LYMPHS PCT: 28 %
Lymphs Abs: 2.1 10*3/uL (ref 0.7–4.0)
MCH: 31.7 pg (ref 26.0–34.0)
MCHC: 32.8 g/dL (ref 30.0–36.0)
MCV: 96.6 fL (ref 78.0–100.0)
MONOS PCT: 8 %
Monocytes Absolute: 0.6 10*3/uL (ref 0.1–1.0)
NEUTROS PCT: 61 %
Neutro Abs: 4.5 10*3/uL (ref 1.7–7.7)
Platelets: 296 10*3/uL (ref 150–400)
RBC: 4.36 MIL/uL (ref 3.87–5.11)
RDW: 13.9 % (ref 11.5–15.5)
WBC: 7.4 10*3/uL (ref 4.0–10.5)

## 2018-02-03 LAB — SURGICAL PCR SCREEN
MRSA, PCR: NEGATIVE
Staphylococcus aureus: POSITIVE — AB

## 2018-02-03 LAB — BASIC METABOLIC PANEL
Anion gap: 14 (ref 5–15)
BUN: 20 mg/dL (ref 6–20)
CHLORIDE: 103 mmol/L (ref 101–111)
CO2: 21 mmol/L — AB (ref 22–32)
CREATININE: 0.9 mg/dL (ref 0.44–1.00)
Calcium: 9.5 mg/dL (ref 8.9–10.3)
GFR calc non Af Amer: >60 mL/min (ref >60)
Glucose, Bld: 109 mg/dL — ABNORMAL HIGH (ref 65–99)
POTASSIUM: 4.1 mmol/L (ref 3.5–5.1)
Sodium: 138 mmol/L (ref 135–145)

## 2018-02-03 LAB — PROTIME-INR
INR: 0.99
PROTHROMBIN TIME: 13 s (ref 11.4–15.2)

## 2018-02-03 LAB — APTT: aPTT: 26 seconds (ref 24–36)

## 2018-02-03 NOTE — Pre-Procedure Instructions (Signed)
    [name] [name]  02/03/2018      [id number] - [place], [place] - [address] [place] [id number] Phone: [phone] Fax: [phone]  [id number] - [place], [place] - [address] [place] [id number] Phone: [phone] Fax: [phone]    Your procedure is scheduled on 02/10/18.  [facility] at 11 A.M.  Call this number if you have problems the morning of surgery:  [phone]   Remember:  Do not eat food or drink liquids after midnight.  Take these medicines the morning of surgery with A SIP OF WATER --xanax,lexapro   Do not wear jewelry, make-up or nail polish.  Do not wear lotions, powders, or perfumes, or deodorant.  Do not shave 48 hours prior to surgery.  Men may shave face and neck.  Do not bring valuables to the hospital.  [facility] is not responsible for any belongings or valuables.  Contacts, dentures or bridgework may not be worn into surgery.  Leave your suitcase in the car.  After surgery it may be brought to your room.  For patients admitted to the hospital, discharge time will be determined by your treatment team.  Patients discharged the day of surgery will not be allowed to drive home.   Name and phone number of your driver:    Special instructions: Do not take any aspirin,anti-inflammatories,vitamins,or herbal supplements 5-7 days prior to surgery.  Please read over the following fact sheets that you were given. MRSA Information

## 2018-02-05 NOTE — H&P (Signed)
TOTAL KNEE ADMISSION H&P  Patient is being admitted for right total knee arthroplasty.  Subjective:  Chief Complaint:right knee pain.  HPI: [name] [name], 57 y.o. female, has a history of pain and functional disability in the right knee due to arthritis and has failed non-surgical conservative treatments for greater than 12 weeks to includeNSAID's and/or analgesics, corticosteriod injections, viscosupplementation injections, flexibility and strengthening excercises, use of assistive devices, weight reduction as appropriate and activity modification.  Onset of symptoms was gradual, starting 5 years ago with gradually worsening course since that time. The patient noted prior procedures on the knee to include  arthroscopy on the right knee(s).  Patient currently rates pain in the right knee(s) at 10 out of 10 with activity. Patient has night pain, worsening of pain with activity and weight bearing, pain that interferes with activities of daily living, crepitus and joint swelling.  Patient has evidence of subchondral cysts, subchondral sclerosis, periarticular osteophytes and joint space narrowing by imaging studies. There is no active infection.  Patient Active Problem List   Diagnosis Date Noted  . Body mass index 40.0-44.9, adult (HCC) 05/05/2017  . Vertigo 03/19/2016  . Ear infection 03/19/2016  . Elevated LFTs 05/11/2015  . Hyperglycemia 05/11/2015  . Obesity 05/11/2015  . Tobacco abuse 05/11/2015  . Cholelithiases   . Thickened endometrium 05/02/2014  . Depression 04/15/2013  . HTN (hypertension) 04/14/2013  . Anxiety 04/14/2013  . OSA (obstructive sleep apnea) 03/20/2012  . Essential hypertension, benign 11/05/2011  . Nonspecific abnormal electrocardiogram (ECG) (EKG) 11/05/2011  . Pain in limb 11/05/2011   Past Medical History:  Diagnosis Date  . Abnormal Pap smear   . Adult ADHD   . Anxiety   . Arthritis    right knee  . Bipolar disorder (HCC)   . Cholelithiases    04/2015   . Depression 04/15/2013  . Ear infection 03/19/2016  . Essential hypertension, benign   . ETOH abuse    drinks wine 5 day/week  . GAD (generalized anxiety disorder)   . Incompetent cervix   . Mental disorder   . Obesity   . OSA on CPAP   . PTSD (post-traumatic stress disorder)   . Sleep apnea   . Thickened endometrium 05/02/2014  . Vaginal Pap smear, abnormal   . Vertigo 03/19/2016    Past Surgical History:  Procedure Laterality Date  . CERVICAL CERCLAGE    . CHOLECYSTECTOMY N/A 05/24/2015   Procedure: LAPAROSCOPIC CHOLECYSTECTOMY WITH INTRAOPERATIVE CHOLANGIOGRAM;  Surgeon: Franky Macho Md, MD;  Location: [facility];  Service: General;  Laterality: N/A;  . DILATION AND CURETTAGE OF UTERUS    . KNEE SURGERY Right   . TONSILLECTOMY      No current facility-administered medications for this encounter.    Current Outpatient Medications  Medication Sig Dispense Refill Last Dose  . ALPRAZolam (XANAX) 0.25 MG tablet Take 0.25 mg by mouth at bedtime as needed for sleep.   Taking  . escitalopram (LEXAPRO) 20 MG tablet TAKE 1 TABLET(20 MG) BY MOUTH DAILY (Patient taking differently: TAKE 10 MG BY MOUTH DAILY) 30 tablet 12 Taking  . hydrochlorothiazide (HYDRODIURIL) 25 MG tablet TAKE 1 TABLET(25 MG) BY MOUTH DAILY 90 tablet 3   . ibuprofen (ADVIL,MOTRIN) 200 MG tablet Take 400 mg by mouth daily as needed for headache or moderate pain.     Marland Kitchen lamoTRIgine (LAMICTAL) 200 MG tablet Take 100-200 mg by mouth See admin instructions. Take 100 mg in the morning and 200 mg in the evening  5 Taking  . lisinopril (PRINIVIL,ZESTRIL) 10 MG tablet TAKE 1 TABLET(10 MG) BY MOUTH DAILY 30 tablet 6   . medroxyPROGESTERone (PROVERA) 10 MG tablet Take 1 daily for first 10 days of month every 3 months, once it has been 1 year, of no bleeding can stop 10 tablet 4 Taking  . meloxicam (MOBIC) 15 MG tablet Take 15 mg by mouth daily as needed for pain.     Marland Kitchen. aspirin EC 81 MG tablet Take 81 mg by mouth daily.   Taking   No  Known Allergies  Social History   Tobacco Use  . Smoking status: Former Smoker    Packs/day: 1.00    Years: 25.00    Pack years: 25.00    Types: Cigarettes    Last attempt to quit: 12/30/2005    Years since quitting: 12.1  . Smokeless tobacco: Never Used  Substance Use Topics  . Alcohol use: Yes    Alcohol/week: 4.2 oz    Types: 7 Glasses of wine per week    Comment: One glass of wine occ    Family History  Problem Relation Age of Onset  . Hypertension Father   . Cancer Father   . Hypertension Mother   . Pancreatic cancer Mother   . Asthma Mother   . Sleep apnea Mother   . Cancer Mother        pancreas  . Coronary artery disease Maternal Grandfather   . Sleep apnea Brother   . Stomach cancer Paternal Aunt   . Stomach cancer Paternal Uncle   . Cancer Maternal Aunt        lung  . Stroke Maternal Aunt   . Heart attack Maternal Aunt   . Diabetes Other   . Colon cancer Neg Hx   . Esophageal cancer Neg Hx   . Rectal cancer Neg Hx      Review of Systems  Musculoskeletal: Positive for joint pain.       Right knee  All other systems reviewed and are negative.   Objective:  Physical Exam  Constitutional: She appears well-developed and well-nourished.  HENT:  Head: Normocephalic and atraumatic.  Eyes: Pupils are equal, round, and reactive to light.  Neck: Normal range of motion.  Cardiovascular: Normal rate and regular rhythm.  Respiratory: Effort normal.  GI: Soft.  Musculoskeletal:  Both knees move about 0-100 at which point she has some terrible medial joint line pain. On the right she has trace effusion with crepitation.  Hip motion is full on both sides and straight leg raise is negative.  There is no gross deformity.  The skin is benign across her knees.  Sensation and motor function are intact in the feet with palpable pulses on both sides.   Neurological: She is alert.  Skin: Skin is warm and dry.  Psychiatric: She has a normal mood and affect. Her behavior  is normal. Judgment and thought content normal.    Vital signs in last 24 hours:    Labs:   Estimated body mass index is 39.54 kg/m as calculated from the following:   Height as of 02/03/18: 5\' 6"  (1.676 m).   Weight as of 02/03/18: 111.1 kg (245 lb).   Imaging Review Plain radiographs demonstrate severe degenerative joint disease of the right knee(s). The overall alignment isneutral. The bone quality appears to be good for age and reported activity level.  Assessment/Plan:  End stage primary arthritis, right knee   The patient history, physical examination, clinical judgment  of the provider and imaging studies are consistent with end stage degenerative joint disease of the right knee(s) and total knee arthroplasty is deemed medically necessary. The treatment options including medical management, injection therapy arthroscopy and arthroplasty were discussed at length. The risks and benefits of total knee arthroplasty were presented and reviewed. The risks due to aseptic loosening, infection, stiffness, patella tracking problems, thromboembolic complications and other imponderables were discussed. The patient acknowledged the explanation, agreed to proceed with the plan and consent was signed. Patient is being admitted for inpatient treatment for surgery, pain control, PT, OT, prophylactic antibiotics, VTE prophylaxis, progressive ambulation and ADL's and discharge planning. The patient is planning to be discharged home with home health services

## 2018-02-09 MED ORDER — TRANEXAMIC ACID 1000 MG/10ML IV SOLN
1000.0000 mg | INTRAVENOUS | Status: AC
  Administered 2018-02-10: 1000 mg via INTRAVENOUS
  Filled 2018-02-09: qty 1100

## 2018-02-09 MED ORDER — TRANEXAMIC ACID 1000 MG/10ML IV SOLN
2000.0000 mg | INTRAVENOUS | Status: AC
  Administered 2018-02-10: 2000 mg via TOPICAL
  Filled 2018-02-09: qty 20

## 2018-02-10 ENCOUNTER — Observation Stay (HOSPITAL_COMMUNITY)
Admission: RE | Admit: 2018-02-10 | Discharge: 2018-02-11 | Disposition: A | Discharge status: Home / Self Care | Payer: Commercial Managed Care - PPO | Source: Ambulatory Visit | Attending: Orthopaedic Surgery | Admitting: Orthopaedic Surgery

## 2018-02-10 ENCOUNTER — Inpatient Hospital Stay (HOSPITAL_COMMUNITY): Payer: Commercial Managed Care - PPO | Admitting: Certified Registered"

## 2018-02-10 ENCOUNTER — Other Ambulatory Visit: Payer: Self-pay

## 2018-02-10 ENCOUNTER — Encounter (HOSPITAL_COMMUNITY): Payer: Self-pay

## 2018-02-10 ENCOUNTER — Encounter (HOSPITAL_COMMUNITY)
Admission: RE | Disposition: A | Discharge status: Home / Self Care | Payer: Self-pay | Source: Ambulatory Visit | Attending: Orthopaedic Surgery

## 2018-02-10 DIAGNOSIS — F319 Bipolar disorder, unspecified: Secondary | ICD-10-CM | POA: Insufficient documentation

## 2018-02-10 DIAGNOSIS — Z791 Long term (current) use of non-steroidal anti-inflammatories (NSAID): Secondary | ICD-10-CM | POA: Insufficient documentation

## 2018-02-10 DIAGNOSIS — Z823 Family history of stroke: Secondary | ICD-10-CM | POA: Insufficient documentation

## 2018-02-10 DIAGNOSIS — Z7982 Long term (current) use of aspirin: Secondary | ICD-10-CM | POA: Diagnosis not present

## 2018-02-10 DIAGNOSIS — Z8249 Family history of ischemic heart disease and other diseases of the circulatory system: Secondary | ICD-10-CM | POA: Insufficient documentation

## 2018-02-10 DIAGNOSIS — Z825 Family history of asthma and other chronic lower respiratory diseases: Secondary | ICD-10-CM | POA: Insufficient documentation

## 2018-02-10 DIAGNOSIS — Z79890 Hormone replacement therapy (postmenopausal): Secondary | ICD-10-CM | POA: Diagnosis not present

## 2018-02-10 DIAGNOSIS — Z801 Family history of malignant neoplasm of trachea, bronchus and lung: Secondary | ICD-10-CM | POA: Insufficient documentation

## 2018-02-10 DIAGNOSIS — Z9049 Acquired absence of other specified parts of digestive tract: Secondary | ICD-10-CM | POA: Diagnosis not present

## 2018-02-10 DIAGNOSIS — M1711 Unilateral primary osteoarthritis, right knee: Secondary | ICD-10-CM | POA: Diagnosis present

## 2018-02-10 DIAGNOSIS — G4733 Obstructive sleep apnea (adult) (pediatric): Secondary | ICD-10-CM | POA: Insufficient documentation

## 2018-02-10 DIAGNOSIS — R2689 Other abnormalities of gait and mobility: Secondary | ICD-10-CM | POA: Insufficient documentation

## 2018-02-10 DIAGNOSIS — F419 Anxiety disorder, unspecified: Secondary | ICD-10-CM | POA: Diagnosis not present

## 2018-02-10 DIAGNOSIS — Z833 Family history of diabetes mellitus: Secondary | ICD-10-CM | POA: Diagnosis not present

## 2018-02-10 DIAGNOSIS — Z6839 Body mass index (BMI) 39.0-39.9, adult: Secondary | ICD-10-CM | POA: Diagnosis not present

## 2018-02-10 DIAGNOSIS — Z800 Family history of malignant neoplasm of digestive organs: Secondary | ICD-10-CM | POA: Diagnosis not present

## 2018-02-10 DIAGNOSIS — Z87891 Personal history of nicotine dependence: Secondary | ICD-10-CM | POA: Insufficient documentation

## 2018-02-10 DIAGNOSIS — Z9889 Other specified postprocedural states: Secondary | ICD-10-CM | POA: Diagnosis not present

## 2018-02-10 DIAGNOSIS — Z79899 Other long term (current) drug therapy: Secondary | ICD-10-CM | POA: Insufficient documentation

## 2018-02-10 DIAGNOSIS — I10 Essential (primary) hypertension: Secondary | ICD-10-CM | POA: Insufficient documentation

## 2018-02-10 HISTORY — DX: Personal history of other medical treatment: Z92.89

## 2018-02-10 HISTORY — DX: Pneumonia, unspecified organism: J18.9

## 2018-02-10 HISTORY — PX: TOTAL KNEE ARTHROPLASTY: SHX125

## 2018-02-10 LAB — TYPE AND SCREEN
ABO/RH(D): A POS
ANTIBODY SCREEN: POSITIVE

## 2018-02-10 SURGERY — ARTHROPLASTY, KNEE, TOTAL
Anesthesia: Spinal | Laterality: Right

## 2018-02-10 MED ORDER — BUPIVACAINE-EPINEPHRINE (PF) 0.5% -1:200000 IJ SOLN
INTRAMUSCULAR | Status: AC
  Filled 2018-02-10: qty 30

## 2018-02-10 MED ORDER — HYDROCODONE-ACETAMINOPHEN 5-325 MG PO TABS: 1.0000 | ORAL_TABLET | ORAL | Status: DC | PRN

## 2018-02-10 MED ORDER — FENTANYL CITRATE (PF) 100 MCG/2ML IJ SOLN
INTRAMUSCULAR | Status: AC
  Administered 2018-02-10: 50 ug via INTRAVENOUS
  Filled 2018-02-10: qty 2

## 2018-02-10 MED ORDER — LISINOPRIL 10 MG PO TABS
10.0000 mg | ORAL_TABLET | Freq: Every day | ORAL | Status: DC
  Administered 2018-02-10 – 2018-02-11 (×2): 10 mg via ORAL
  Filled 2018-02-10 (×2): qty 1

## 2018-02-10 MED ORDER — BISACODYL 5 MG PO TBEC: 5.0000 mg | DELAYED_RELEASE_TABLET | Freq: Every day | ORAL | Status: DC | PRN

## 2018-02-10 MED ORDER — METOCLOPRAMIDE HCL 5 MG/ML IJ SOLN: 5.0000 mg | Freq: Three times a day (TID) | INTRAMUSCULAR | Status: DC | PRN

## 2018-02-10 MED ORDER — GLYCOPYRROLATE 0.2 MG/ML IV SOSY
PREFILLED_SYRINGE | INTRAVENOUS | Status: DC | PRN
  Administered 2018-02-10: .2 mg via INTRAVENOUS

## 2018-02-10 MED ORDER — BUPIVACAINE LIPOSOME 1.3 % IJ SUSP
INTRAMUSCULAR | Status: DC | PRN
  Administered 2018-02-10: 20 mL

## 2018-02-10 MED ORDER — PHENOL 1.4 % MT LIQD: 1.0000 | OROMUCOSAL | Status: DC | PRN

## 2018-02-10 MED ORDER — LIDOCAINE 2% (20 MG/ML) 5 ML SYRINGE
INTRAMUSCULAR | Status: DC | PRN
  Administered 2018-02-10: 60 mg via INTRAVENOUS

## 2018-02-10 MED ORDER — ACETAMINOPHEN 650 MG RE SUPP: 650.0000 mg | RECTAL | Status: DC | PRN

## 2018-02-10 MED ORDER — FENTANYL CITRATE (PF) 100 MCG/2ML IJ SOLN
50.0000 ug | Freq: Once | INTRAMUSCULAR | Status: AC
  Administered 2018-02-10: 50 ug via INTRAVENOUS

## 2018-02-10 MED ORDER — LACTATED RINGERS IV SOLN
INTRAVENOUS | Status: DC
  Administered 2018-02-10: 08:00:00 via INTRAVENOUS

## 2018-02-10 MED ORDER — CEFAZOLIN SODIUM-DEXTROSE 2-4 GM/100ML-% IV SOLN
2.0000 g | Freq: Four times a day (QID) | INTRAVENOUS | Status: AC
  Administered 2018-02-10 (×2): 2 g via INTRAVENOUS
  Filled 2018-02-10 (×3): qty 100

## 2018-02-10 MED ORDER — ESCITALOPRAM OXALATE 10 MG PO TABS
10.0000 mg | ORAL_TABLET | Freq: Every day | ORAL | Status: DC
  Administered 2018-02-10 – 2018-02-11 (×2): 10 mg via ORAL
  Filled 2018-02-10 (×2): qty 1

## 2018-02-10 MED ORDER — DOCUSATE SODIUM 100 MG PO CAPS
100.0000 mg | ORAL_CAPSULE | Freq: Two times a day (BID) | ORAL | Status: DC
  Administered 2018-02-10 – 2018-02-11 (×3): 100 mg via ORAL
  Filled 2018-02-10 (×3): qty 1

## 2018-02-10 MED ORDER — METHOCARBAMOL 500 MG PO TABS
500.0000 mg | ORAL_TABLET | Freq: Four times a day (QID) | ORAL | Status: DC | PRN
  Administered 2018-02-10: 500 mg via ORAL
  Filled 2018-02-10: qty 1

## 2018-02-10 MED ORDER — DEXAMETHASONE SODIUM PHOSPHATE 10 MG/ML IJ SOLN
INTRAMUSCULAR | Status: DC | PRN
  Administered 2018-02-10: 4 mg via INTRAVENOUS

## 2018-02-10 MED ORDER — DEXTROSE 5 % IV SOLN
500.0000 mg | Freq: Four times a day (QID) | INTRAVENOUS | Status: DC | PRN
  Filled 2018-02-10: qty 5

## 2018-02-10 MED ORDER — PHENYLEPHRINE HCL 10 MG/ML IJ SOLN
INTRAVENOUS | Status: DC | PRN
  Administered 2018-02-10: 20 ug/min via INTRAVENOUS

## 2018-02-10 MED ORDER — ONDANSETRON HCL 4 MG/2ML IJ SOLN: 4.0000 mg | Freq: Four times a day (QID) | INTRAMUSCULAR | Status: DC | PRN

## 2018-02-10 MED ORDER — MIDAZOLAM HCL 2 MG/2ML IJ SOLN
2.0000 mg | Freq: Once | INTRAMUSCULAR | Status: AC
  Administered 2018-02-10: 2 mg via INTRAVENOUS

## 2018-02-10 MED ORDER — DIPHENHYDRAMINE HCL 12.5 MG/5ML PO ELIX: 12.5000 mg | ORAL_SOLUTION | ORAL | Status: DC | PRN

## 2018-02-10 MED ORDER — 0.9 % SODIUM CHLORIDE (POUR BTL) OPTIME
TOPICAL | Status: DC | PRN
  Administered 2018-02-10: 1000 mL

## 2018-02-10 MED ORDER — ONDANSETRON HCL 4 MG PO TABS: 4.0000 mg | ORAL_TABLET | Freq: Four times a day (QID) | ORAL | Status: DC | PRN

## 2018-02-10 MED ORDER — ALUM & MAG HYDROXIDE-SIMETH 200-200-20 MG/5ML PO SUSP: 30.0000 mL | ORAL | Status: DC | PRN

## 2018-02-10 MED ORDER — CEFAZOLIN SODIUM-DEXTROSE 2-4 GM/100ML-% IV SOLN
2.0000 g | INTRAVENOUS | Status: AC
  Administered 2018-02-10: 2 g via INTRAVENOUS
  Filled 2018-02-10: qty 100

## 2018-02-10 MED ORDER — LAMOTRIGINE 100 MG PO TABS
200.0000 mg | ORAL_TABLET | Freq: Every evening | ORAL | Status: DC
  Administered 2018-02-10: 200 mg via ORAL
  Filled 2018-02-10: qty 2

## 2018-02-10 MED ORDER — PROPOFOL 10 MG/ML IV BOLUS
INTRAVENOUS | Status: DC | PRN
  Administered 2018-02-10 (×2): 20 mg via INTRAVENOUS

## 2018-02-10 MED ORDER — SODIUM CHLORIDE 0.9 % IJ SOLN
INTRAMUSCULAR | Status: DC | PRN
  Administered 2018-02-10: 20 mL

## 2018-02-10 MED ORDER — ASPIRIN EC 325 MG PO TBEC
325.0000 mg | DELAYED_RELEASE_TABLET | Freq: Two times a day (BID) | ORAL | Status: DC
  Administered 2018-02-11: 325 mg via ORAL
  Filled 2018-02-10: qty 1

## 2018-02-10 MED ORDER — LAMOTRIGINE 100 MG PO TABS: 100.0000 mg | ORAL_TABLET | ORAL | Status: DC

## 2018-02-10 MED ORDER — ONDANSETRON HCL 4 MG/2ML IJ SOLN
INTRAMUSCULAR | Status: DC | PRN
  Administered 2018-02-10: 4 mg via INTRAVENOUS

## 2018-02-10 MED ORDER — HYDROCHLOROTHIAZIDE 25 MG PO TABS
25.0000 mg | ORAL_TABLET | Freq: Every day | ORAL | Status: DC
  Administered 2018-02-10 – 2018-02-11 (×2): 25 mg via ORAL
  Filled 2018-02-10 (×2): qty 1

## 2018-02-10 MED ORDER — HYDROCODONE-ACETAMINOPHEN 5-325 MG PO TABS
2.0000 | ORAL_TABLET | ORAL | Status: DC | PRN
  Administered 2018-02-10 – 2018-02-11 (×6): 2 via ORAL
  Filled 2018-02-10 (×7): qty 2

## 2018-02-10 MED ORDER — MIDAZOLAM HCL 5 MG/5ML IJ SOLN
INTRAMUSCULAR | Status: DC | PRN
  Administered 2018-02-10 (×2): 1 mg via INTRAVENOUS

## 2018-02-10 MED ORDER — SODIUM CHLORIDE 0.9 % IR SOLN
Status: DC | PRN
  Administered 2018-02-10: 3000 mL

## 2018-02-10 MED ORDER — MIDAZOLAM HCL 2 MG/2ML IJ SOLN
INTRAMUSCULAR | Status: AC
  Administered 2018-02-10: 2 mg via INTRAVENOUS
  Filled 2018-02-10: qty 2

## 2018-02-10 MED ORDER — CHLORHEXIDINE GLUCONATE 4 % EX LIQD: 60.0000 mL | Freq: Once | CUTANEOUS | Status: DC

## 2018-02-10 MED ORDER — METOCLOPRAMIDE HCL 5 MG PO TABS: 5.0000 mg | ORAL_TABLET | Freq: Three times a day (TID) | ORAL | Status: DC | PRN

## 2018-02-10 MED ORDER — BUPIVACAINE-EPINEPHRINE (PF) 0.5% -1:200000 IJ SOLN
INTRAMUSCULAR | Status: DC | PRN
  Administered 2018-02-10: 20 mL via PERINEURAL

## 2018-02-10 MED ORDER — LAMOTRIGINE 100 MG PO TABS
100.0000 mg | ORAL_TABLET | ORAL | Status: DC
Start: 2018-02-11 — End: 2018-02-11
  Administered 2018-02-11: 100 mg via ORAL
  Filled 2018-02-10: qty 1

## 2018-02-10 MED ORDER — PROPOFOL 500 MG/50ML IV EMUL
INTRAVENOUS | Status: DC | PRN
  Administered 2018-02-10: 50 ug/kg/min via INTRAVENOUS

## 2018-02-10 MED ORDER — ALPRAZOLAM 0.25 MG PO TABS
0.2500 mg | ORAL_TABLET | Freq: Every evening | ORAL | Status: DC | PRN
Start: 2018-02-10 — End: 2018-02-11

## 2018-02-10 MED ORDER — MIDAZOLAM HCL 2 MG/2ML IJ SOLN
INTRAMUSCULAR | Status: AC
  Filled 2018-02-10: qty 2

## 2018-02-10 MED ORDER — ACETAMINOPHEN 325 MG PO TABS
650.0000 mg | ORAL_TABLET | ORAL | Status: DC | PRN
  Administered 2018-02-10: 650 mg via ORAL
  Filled 2018-02-10: qty 2

## 2018-02-10 MED ORDER — MENTHOL 3 MG MT LOZG: 1.0000 | LOZENGE | OROMUCOSAL | Status: DC | PRN

## 2018-02-10 MED ORDER — BUPIVACAINE LIPOSOME 1.3 % IJ SUSP
20.0000 mL | Freq: Once | INTRAMUSCULAR | Status: DC
  Filled 2018-02-10: qty 20

## 2018-02-10 MED ORDER — HYDROMORPHONE HCL 1 MG/ML IJ SOLN
0.5000 mg | INTRAMUSCULAR | Status: DC | PRN
  Administered 2018-02-10: 1 mg via INTRAVENOUS
  Filled 2018-02-10: qty 1

## 2018-02-10 MED ORDER — LACTATED RINGERS IV SOLN
INTRAVENOUS | Status: DC
  Administered 2018-02-10: 17:00:00 via INTRAVENOUS

## 2018-02-10 MED ORDER — TRANEXAMIC ACID 1000 MG/10ML IV SOLN
1000.0000 mg | Freq: Once | INTRAVENOUS | Status: AC
  Administered 2018-02-10: 1000 mg via INTRAVENOUS
  Filled 2018-02-10 (×2): qty 10

## 2018-02-10 MED ORDER — FENTANYL CITRATE (PF) 250 MCG/5ML IJ SOLN
INTRAMUSCULAR | Status: AC
  Filled 2018-02-10: qty 5

## 2018-02-10 SURGICAL SUPPLY — 57 items
BAG DECANTER FOR FLEXI CONT (MISCELLANEOUS) IMPLANT
BANDAGE ESMARK 6X9 LF (GAUZE/BANDAGES/DRESSINGS) ×1 IMPLANT
BLADE SAGITTAL 25.0X1.19X90 (BLADE) IMPLANT
BLADE SAGITTAL 25.0X1.19X90MM (BLADE)
BLADE SAW SGTL 13.0X1.19X90.0M (BLADE) IMPLANT
BNDG CMPR 9X6 STRL LF SNTH (GAUZE/BANDAGES/DRESSINGS) ×1
BNDG CMPR MED 10X6 ELC LF (GAUZE/BANDAGES/DRESSINGS) ×1
BNDG ELASTIC 6X10 VLCR STRL LF (GAUZE/BANDAGES/DRESSINGS) ×3 IMPLANT
BNDG ESMARK 6X9 LF (GAUZE/BANDAGES/DRESSINGS) ×3
BOWL SMART MIX CTS (DISPOSABLE) ×3 IMPLANT
CAP KNEE TOTAL 3 SIGMA ×2 IMPLANT
CEMENT HV SMART SET (Cement) ×6 IMPLANT
CLOSURE STERI-STRIP 1/4X4 (GAUZE/BANDAGES/DRESSINGS) ×2 IMPLANT
CLOSURE WOUND 1/2 X4 (GAUZE/BANDAGES/DRESSINGS) ×1
COVER SURGICAL LIGHT HANDLE (MISCELLANEOUS) ×3 IMPLANT
CUFF TOURNIQUET SINGLE 34IN LL (TOURNIQUET CUFF) ×3 IMPLANT
CUFF TOURNIQUET SINGLE 44IN (TOURNIQUET CUFF) IMPLANT
DECANTER SPIKE VIAL GLASS SM (MISCELLANEOUS) ×3 IMPLANT
DRAPE EXTREMITY T 121X128X90 (DRAPE) ×3 IMPLANT
DRAPE HALF SHEET 40X57 (DRAPES) ×6 IMPLANT
DRAPE U-SHAPE 47X51 STRL (DRAPES) ×3 IMPLANT
DRSG AQUACEL AG ADV 3.5X 6 (GAUZE/BANDAGES/DRESSINGS) ×2 IMPLANT
DRSG AQUACEL AG ADV 3.5X10 (GAUZE/BANDAGES/DRESSINGS) ×3 IMPLANT
DURAPREP 26ML APPLICATOR (WOUND CARE) ×3 IMPLANT
ELECT REM PT RETURN 9FT ADLT (ELECTROSURGICAL) ×3
ELECTRODE REM PT RTRN 9FT ADLT (ELECTROSURGICAL) ×1 IMPLANT
GLOVE BIO SURGEON STRL SZ8 (GLOVE) ×6 IMPLANT
GLOVE BIOGEL PI IND STRL 8 (GLOVE) ×2 IMPLANT
GLOVE BIOGEL PI INDICATOR 8 (GLOVE) ×4
GOWN STRL REUS W/ TWL LRG LVL3 (GOWN DISPOSABLE) ×1 IMPLANT
GOWN STRL REUS W/ TWL XL LVL3 (GOWN DISPOSABLE) ×2 IMPLANT
GOWN STRL REUS W/TWL LRG LVL3 (GOWN DISPOSABLE) ×3
GOWN STRL REUS W/TWL XL LVL3 (GOWN DISPOSABLE) ×6
HANDPIECE INTERPULSE COAX TIP (DISPOSABLE) ×3
HOOD PEEL AWAY FACE SHEILD DIS (HOOD) ×6 IMPLANT
IMMOBILIZER KNEE 22 UNIV (SOFTGOODS) ×3 IMPLANT
KIT BASIN OR (CUSTOM PROCEDURE TRAY) ×3 IMPLANT
KIT ROOM TURNOVER OR (KITS) ×3 IMPLANT
MANIFOLD NEPTUNE II (INSTRUMENTS) ×3 IMPLANT
NDL HYPO 21X1 ECLIPSE (NEEDLE) ×1 IMPLANT
NEEDLE HYPO 21X1 ECLIPSE (NEEDLE) ×3 IMPLANT
NS IRRIG 1000ML POUR BTL (IV SOLUTION) ×3 IMPLANT
PACK TOTAL JOINT (CUSTOM PROCEDURE TRAY) ×3 IMPLANT
PAD ARMBOARD 7.5X6 YLW CONV (MISCELLANEOUS) ×6 IMPLANT
SET HNDPC FAN SPRY TIP SCT (DISPOSABLE) ×1 IMPLANT
STRIP CLOSURE SKIN 1/2X4 (GAUZE/BANDAGES/DRESSINGS) ×2 IMPLANT
SUT VIC AB 0 CT1 27 (SUTURE) ×3
SUT VIC AB 0 CT1 27XBRD ANBCTR (SUTURE) ×1 IMPLANT
SUT VIC AB 2-0 CT1 27 (SUTURE) ×3
SUT VIC AB 2-0 CT1 TAPERPNT 27 (SUTURE) ×1 IMPLANT
SUT VIC AB 3-0 FS2 27 (SUTURE) ×3 IMPLANT
SUT VLOC 180 0 24IN GS25 (SUTURE) ×3 IMPLANT
SYR 50ML LL SCALE MARK (SYRINGE) ×3 IMPLANT
TOWEL OR 17X24 6PK STRL BLUE (TOWEL DISPOSABLE) ×3 IMPLANT
TOWEL OR 17X26 10 PK STRL BLUE (TOWEL DISPOSABLE) ×3 IMPLANT
TRAY CATH 16FR W/PLASTIC CATH (SET/KITS/TRAYS/PACK) IMPLANT
UPCHARGE REV TRAY MBT KNEE ×2 IMPLANT

## 2018-02-10 NOTE — Anesthesia Preprocedure Evaluation (Signed)
Anesthesia Evaluation  Patient identified by MRN, date of birth, ID band Patient awake    Reviewed: Allergy & Precautions, H&P , Patient's Chart, lab work & pertinent test results, reviewed documented beta blocker date and time   Airway Mallampati: II  TM Distance: >3 FB Neck ROM: full    Dental no notable dental hx.    Pulmonary former smoker,    Pulmonary exam normal breath sounds clear to auscultation       Cardiovascular hypertension,  Rhythm:regular Rate:Normal     Neuro/Psych    GI/Hepatic   Endo/Other  Morbid obesity  Renal/GU      Musculoskeletal   Abdominal   Peds  Hematology   Anesthesia Other Findings   Reproductive/Obstetrics                             Anesthesia Physical Anesthesia Plan  ASA: II  Anesthesia Plan: Spinal   Post-op Pain Management:    Induction:   PONV Risk Score and Plan: 2 and Dexamethasone, Ondansetron and Treatment may vary due to age or medical condition  Airway Management Planned:   Additional Equipment:   Intra-op Plan:   Post-operative Plan:   Informed Consent: I have reviewed the patients History and Physical, chart, labs and discussed the procedure including the risks, benefits and alternatives for the proposed anesthesia with the patient or authorized representative who has indicated his/her understanding and acceptance.   Dental Advisory Given  Plan Discussed with: CRNA and Surgeon  Anesthesia Plan Comments: (  )        Anesthesia Quick Evaluation

## 2018-02-10 NOTE — Anesthesia Procedure Notes (Signed)
Spinal  Patient location during procedure: OR Staffing Anesthesiologist: Daundre Biel, MD Spinal Block Patient position: sitting Prep: DuraPrep Patient monitoring: heart rate, blood pressure and continuous pulse ox Approach: right paramedian Location: L3-4 Injection technique: single-shot Needle Needle type: Sprotte  Needle gauge: 24 G Needle length: 9 cm Assessment Sensory level: T4 Additional Notes Spinal Dosage in OR  .75% Bupivicaine ml       1.8        

## 2018-02-10 NOTE — Transfer of Care (Signed)
Immediate Anesthesia Transfer of Care Note  Patient: [name] [name]  Procedure(s) Performed: TOTAL KNEE ARTHROPLASTY (Right )  Patient Location: PACU  Anesthesia Type:MAC, Spinal and MAC combined with regional for post-op pain  Level of Consciousness: awake, alert , oriented and patient cooperative  Airway & Oxygen Therapy: Patient Spontanous Breathing and Patient connected to nasal cannula oxygen  Post-op Assessment: Report given to RN and Post -op Vital signs reviewed and stable  Post vital signs: Reviewed and stable  Last Vitals:  Vitals:   02/10/18 0915 02/10/18 0920  BP: (!) 118/54 (!) 128/38  Pulse: 63 66  Resp: 19 16  Temp:    SpO2: 98% 98%    Last Pain:  Vitals:   02/10/18 0824  TempSrc:   PainSc: 5       Patients Stated Pain Goal: 3 (06/77/03 4035)  Complications: No apparent anesthesia complications

## 2018-02-10 NOTE — Op Note (Signed)
PREOP DIAGNOSIS: DJD RIGHT KNEE POSTOP DIAGNOSIS: same PROCEDURE: RIGHT TKR ANESTHESIA: Spinal and MAC ATTENDING SURGEON: Dangelo Guzzetta G ASSISTANT: Loni Dolly PA  INDICATIONS FOR PROCEDURE: [name] [name] is a 57 y.o. female who has struggled for a long time with pain due to degenerative arthritis of the right knee.  The patient has failed many conservative non-operative measures and at this point has pain which limits the ability to sleep and walk.  The patient is offered total knee replacement.  Informed operative consent was obtained after discussion of possible risks of anesthesia, infection, neurovascular injury, DVT, and death.  The importance of the post-operative rehabilitation protocol to optimize result was stressed extensively with the patient.  SUMMARY OF FINDINGS AND PROCEDURE:  [name] [name] was taken to the operative suite where under the above anesthesia a right knee replacement was performed.  There were advanced degenerative changes and the bone quality was good.  We used the DePuy LCS system and placed size standard femur, 4MBT revision tibia, 38 mm all polyethylene patella, and a size 15 mm spacer.  Loni Dolly PA-C assisted throughout and was invaluable to the completion of the case in that he helped retract and maintain exposure while I placed components.  He also helped close thereby minimizing OR time.  The patient was admitted for appropriate post-op care to include perioperative antibiotics and mechanical and pharmacologic measures for DVT prophylaxis.  DESCRIPTION OF PROCEDURE:  [name] [name] was taken to the operative suite where the above anesthesia was applied.  The patient was positioned supine and prepped and draped in normal sterile fashion.  An appropriate time out was performed.  After the administration of kefzol pre-op antibiotic the leg was elevated and exsanguinated and a tourniquet inflated. A standard longitudinal incision was made on the anterior knee.   Dissection was carried down to the extensor mechanism.  All appropriate anti-infective measures were used including the pre-operative antibiotic, betadine impregnated drape, and closed hooded exhaust systems for each member of the surgical team.  A medial parapatellar incision was made in the extensor mechanism and the knee cap flipped and the knee flexed.  Some residual meniscal tissues were removed along with any remaining ACL/PCL tissue.  A guide was placed on the tibia and a flat cut was made on it's superior surface.  An intramedullary guide was placed in the femur and was utilized to make anterior and posterior cuts creating an appropriate flexion gap.  A second intramedullary guide was placed in the femur to make a distal cut properly balancing the knee with an extension gap equal to the flexion gap.  The three bones sized to the above mentioned sizes and the appropriate guides were placed and utilized.  A trial reduction was done and the knee easily came to full extension and the patella tracked well on flexion.  The trial components were removed and all bones were cleaned with pulsatile lavage and then dried thoroughly.  Cement was mixed and was pressurized onto the bones followed by placement of the aforementioned components.  Excess cement was trimmed and pressure was held on the components until the cement had hardened.  The tourniquet was deflated and a small amount of bleeding was controlled with cautery and pressure.  The knee was irrigated thoroughly.  The extensor mechanism was re-approximated with V-loc suture in running fashion.  The knee was flexed and the repair was solid.  The subcutaneous tissues were re-approximated with #0 and #2-0 vicryl and the skin closed with  a subcuticular stitch and steristrips.  A sterile dressing was applied.  Intraoperative fluids, EBL, and tourniquet time can be obtained from anesthesia records.  DISPOSITION:  The patient was taken to recovery room in stable  condition and admitted for appropriate post-op care to include peri-operative antibiotic and DVT prophylaxis with mechanical and pharmacologic measures.  [name] [name] G 02/10/2018, 12:13 PM

## 2018-02-10 NOTE — Anesthesia Procedure Notes (Signed)
Anesthesia Regional Block: Adductor canal block   Pre-Anesthetic Checklist: ,, timeout performed, Correct Patient, Correct Site, Correct Laterality, Correct Procedure, Correct Position, site marked, Risks and benefits discussed, pre-op evaluation,  At surgeon's request and post-op pain management  Laterality: Right  Prep: chloraprep       Needles:   Needle Type: Echogenic Needle     Needle Length: 9cm  Needle Gauge: 21     Additional Needles:   Procedures:,,,, ultrasound used (permanent image in chart),,,,  Narrative:  Start time: 02/10/2018 9:02 AM End time: 02/10/2018 9:08 AM Injection made incrementally with aspirations every 5 mL. Anesthesiologist: Cristela BlueJackson, Stepehn Eckard, MD

## 2018-02-10 NOTE — Progress Notes (Signed)
Witnessed and controlled with PT. Pt is comfortable in room at this time with no complaints of pain at this time.

## 2018-02-10 NOTE — Evaluation (Signed)
Physical Therapy Evaluation Patient Details Name: [name] [name] MRN: [id number] DOB: 1961/01/10 Today's Date: 02/10/2018   History of Present Illness  Pt is a 57 y.o. female s/p elective R TKA on 02/10/18. PMH includes generalized anxiety disorder, OSA, ADHD, bipolar disorder. arthritis, obesity.     Clinical Impression  Pt presents with an overall decrease in functional mobility secondary to above. PTA, pt indep with mobility and has family available for 24/7 support. Educ on precautions, positioning, therex, and importance of mobility. Today, pt able to ambulate 150' with RW and min guard for balance; 1x witnessed, controlled descent to ground while pt walking and attempting to look at ground behind her. Pt able to continue ambulating after this with no c/o pain or distress, demonstrating good RLE motion; RN and MD notified. Pt very motivated and anxious to return to PLOF; expect to progress well with mobility. Pt would benefit from continued acute PT services to maximize functional mobility and independence prior to d/c with HHPT services.     Follow Up Recommendations Home health PT;Supervision for mobility/OOB    Equipment Recommendations  None recommended by PT    Recommendations for Other Services OT consult     Precautions / Restrictions Precautions Precautions: Knee Precaution Comments: Verbally reviewed precautions Required Braces or Orthoses: Knee Immobilizer - Right Restrictions Weight Bearing Restrictions: Yes RLE Weight Bearing: Weight bearing as tolerated      Mobility  Bed Mobility Overal bed mobility: Independent                Transfers Overall transfer level: Needs assistance Equipment used: Rolling walker (2 wheeled) Transfers: Sit to/from Stand Sit to Stand: Min guard         General transfer comment: Min guard for balance. Pt able to don underwear while standing with no UE support and min guard  Ambulation/Gait Ambulation/Gait assistance: Min  guard Ambulation Distance (Feet): 150 Feet Assistive device: Rolling walker (2 wheeled) Gait Pattern/deviations: Step-through pattern;Decreased stride length;Decreased weight shift to right;Antalgic Gait velocity: Decreased Gait velocity interpretation: <1.8 ft/sec, indicative of risk for recurrent falls General Gait Details: Slow, controlled amb with RW and min guard for balance. Required intermittent cues to encourage step-through and heel-to-toe gait pattern, as pt flat footed with RLE. Heavy reliance on BUEs for support  Stairs            Wheelchair Mobility    Modified Rankin (Stroke Patients Only)       Balance Overall balance assessment: Needs assistance   Sitting balance-Leahy Scale: Good       Standing balance-Leahy Scale: Fair Standing balance comment: Can static stand with no UE support                             Pertinent Vitals/Pain Pain Assessment: Faces Faces Pain Scale: Hurts little more Pain Location: R knee Pain Descriptors / Indicators: Sore Pain Intervention(s): Monitored during session    Home Living Family/patient expects to be discharged to:: Private residence Living Arrangements: Spouse/significant other Available Help at Discharge: Family;Available 24 hours/day;Friend(s) Type of Home: House Home Access: Stairs to enter Entrance Stairs-Rails: Right;Left;Can reach both Entrance Stairs-Number of Steps: 2 Home Layout: Two level;Able to live on main level with bedroom/bathroom Home Equipment: Dan Humphreys - 2 wheels;Bedside commode      Prior Function Level of Independence: Independent               Hand Dominance  Extremity/Trunk Assessment   Upper Extremity Assessment Upper Extremity Assessment: Overall WFL for tasks assessed    Lower Extremity Assessment Lower Extremity Assessment: RLE deficits/detail RLE Deficits / Details: s/p R TKA; hip flex 3/5, knee ext 3-/5, knee flex 3/5, ankle 3/5        Communication   Communication: No difficulties  Cognition Arousal/Alertness: Awake/alert Behavior During Therapy: WFL for tasks assessed/performed Overall Cognitive Status: Within Functional Limits for tasks assessed                                 General Comments: Pt continually asking questions about where she "should be" walking-wise and progression-wise; encouraged to take one day at a time. Had to be redirected a few times to this as continually asking questions about expected progression stating "I have to be better in 4 weeks to play with my grandkids"      General Comments General comments (skin integrity, edema, etc.): Husband and brother present throughout session    Exercises Total Joint Exercises Ankle Circles/Pumps: AROM;Both;10 reps;Seated Long Arc Quad: AAROM;Right;10 reps;Seated Knee Flexion: AAROM;Right;10 reps;Seated Marching in Standing: AROM;Right;10 reps;Seated   Assessment/Plan    PT Assessment Patient needs continued PT services  PT Problem List Decreased strength;Decreased range of motion;Decreased activity tolerance;Decreased balance;Decreased mobility;Decreased knowledge of use of DME;Pain       PT Treatment Interventions DME instruction;Gait training;Stair training;Functional mobility training;Therapeutic activities;Therapeutic exercise;Balance training;Patient/family education    PT Goals (Current goals can be found in the Care Plan section)  Acute Rehab PT Goals Patient Stated Goal: Get back to playing with grandkids asap PT Goal Formulation: With patient Time For Goal Achievement: 02/24/18 Potential to Achieve Goals: Good    Frequency 7X/week   Barriers to discharge        Co-evaluation               AM-PAC PT "6 Clicks" Daily Activity  Outcome Measure Difficulty turning over in bed (including adjusting bedclothes, sheets and blankets)?: None Difficulty moving from lying on back to sitting on the side of the bed? :  None Difficulty sitting down on and standing up from a chair with arms (e.g., wheelchair, bedside commode, etc,.)?: A Little Help needed moving to and from a bed to chair (including a wheelchair)?: A Little Help needed walking in hospital room?: A Little Help needed climbing 3-5 steps with a railing? : A Little 6 Click Score: 20    End of Session Equipment Utilized During Treatment: Gait belt Activity Tolerance: Patient tolerated treatment well Patient left: in chair;with call bell/phone within reach;with family/visitor present;with nursing/sitter in room Nurse Communication: Mobility status PT Visit Diagnosis: Other abnormalities of gait and mobility (R26.89);Pain Pain - Right/Left: Right Pain - part of body: Knee    Time: [id number] PT Time Calculation (min) (ACUTE ONLY): 29 min   Charges:   PT Evaluation $PT Eval Moderate Complexity: 1 Mod PT Treatments $Gait Training: 8-22 mins   PT G Codes:       Ina HomesJaclyn Kafi Dotter, PT, DPT Acute Rehab Services  Pager: [phone]  Malachy ChamberJaclyn L Niko Jakel 02/10/2018, 4:03 PM

## 2018-02-10 NOTE — Interval H&P Note (Signed)
History and Physical Interval Note:  02/10/2018 9:30 AM  [name] [name]  has presented today for surgery, with the diagnosis of RIGHT KNEE DEGENERATIVE JOINT DISEASE  The various methods of treatment have been discussed with the patient and family. After consideration of risks, benefits and other options for treatment, the patient has consented to  Procedure(s): TOTAL KNEE ARTHROPLASTY (Right) as a surgical intervention .  The patient's history has been reviewed, patient examined, no change in status, stable for surgery.  I have reviewed the patient's chart and labs.  Questions were answered to the patient's satisfaction.     Colman Birdwell G

## 2018-02-11 ENCOUNTER — Encounter (HOSPITAL_COMMUNITY): Payer: Self-pay | Admitting: Orthopaedic Surgery

## 2018-02-11 DIAGNOSIS — M1711 Unilateral primary osteoarthritis, right knee: Secondary | ICD-10-CM | POA: Diagnosis not present

## 2018-02-11 MED ORDER — ASPIRIN 325 MG PO TBEC: 325.0000 mg | DELAYED_RELEASE_TABLET | Freq: Two times a day (BID) | ORAL | 0 refills | Status: DC

## 2018-02-11 MED ORDER — DOCUSATE SODIUM 100 MG PO CAPS: 100.0000 mg | ORAL_CAPSULE | Freq: Two times a day (BID) | ORAL | 0 refills | Status: DC

## 2018-02-11 MED ORDER — HYDROCODONE-ACETAMINOPHEN 5-325 MG PO TABS: 1.0000 | ORAL_TABLET | ORAL | 0 refills | Status: DC | PRN

## 2018-02-11 MED ORDER — TIZANIDINE HCL 4 MG PO TABS: 4.0000 mg | ORAL_TABLET | Freq: Four times a day (QID) | ORAL | 1 refills | Status: DC | PRN

## 2018-02-11 MED ORDER — BISACODYL 5 MG PO TBEC: 5.0000 mg | DELAYED_RELEASE_TABLET | Freq: Every day | ORAL | 0 refills | Status: DC | PRN

## 2018-02-11 NOTE — Discharge Summary (Cosign Needed)
Patient ID: [name] [name] MRN: [id number] DOB/AGE: 08-03-1961 57 y.o.  Admit date: 02/10/2018 Discharge date: 02/11/2018  Admission Diagnoses:  Principal Problem:   Primary localized osteoarthritis of right knee Active Problems:   Primary osteoarthritis of right knee   Discharge Diagnoses:  Same  Past Medical History:  Diagnosis Date  . Abnormal Pap smear   . Adult ADHD   . Anxiety   . Arthritis    "knees" (02/10/2018)  . Bipolar disorder (HCC)   . Cholelithiases    04/2015  . Depression 04/15/2013  . Ear infection 03/19/2016  . Essential hypertension, benign   . ETOH abuse    drinks wine 5 day/week  . GAD (generalized anxiety disorder)   . History of blood transfusion 1980s   "related to miscarriage"  . Incompetent cervix   . Obesity   . OSA on CPAP   . Pneumonia    "used to have it alot; nothing in years" (02/10/2018)  . PTSD (post-traumatic stress disorder)   . Thickened endometrium 05/02/2014  . Vaginal Pap smear, abnormal   . Vertigo 03/19/2016    Surgeries: Procedure(s): TOTAL KNEE ARTHROPLASTY on 02/10/2018   Consultants:   Discharged Condition: Improved  Hospital Course: [name] [name] is an 57 y.o. female who was admitted 02/10/2018 for operative treatment ofPrimary localized osteoarthritis of right knee. Patient has severe unremitting pain that affects sleep, daily activities, and work/hobbies. After pre-op clearance the patient was taken to the operating room on 02/10/2018 and underwent  Procedure(s): TOTAL KNEE ARTHROPLASTY.    Patient was given perioperative antibiotics:  Anti-infectives (From admission, onward)   Start     Dose/Rate Route Frequency Ordered Stop   02/10/18 1530  ceFAZolin (ANCEF) IVPB 2g/100 mL premix     2 g 200 mL/hr over 30 Minutes Intravenous Every 6 hours 02/10/18 1359 02/10/18 2223   02/10/18 0750  ceFAZolin (ANCEF) IVPB 2g/100 mL premix     2 g 200 mL/hr over 30 Minutes Intravenous On call to O.R. 02/10/18 0750 02/10/18 1046        Patient was given sequential compression devices, early ambulation, and chemoprophylaxis to prevent DVT.  Patient benefited maximally from hospital stay and there were no complications.    Recent vital signs:  Patient Vitals for the past 24 hrs:  BP Temp Temp src Pulse Resp SpO2 Height Weight  02/11/18 0500 (!) 131/54 98.3 F (36.8 C) Oral (!) 56 - 98 % - -  02/10/18 1952 111/81 98.2 F (36.8 C) Oral 66 18 97 % - -  02/10/18 1400 (!) 147/60 97.7 F (36.5 C) Oral 61 20 98 % - -  02/10/18 1345 139/79 97.7 F (36.5 C) - 68 (!) 23 97 % - -  02/10/18 1315 (!) 168/67 - - (!) 57 20 100 % - -  02/10/18 1300 (!) 169/72 - - 62 (!) 25 99 % - -  02/10/18 1252 - 97.6 F (36.4 C) - 64 17 100 % - -  02/10/18 0920 (!) 128/38 - - 66 16 98 % - -  02/10/18 0915 (!) 118/54 - - 63 19 98 % - -  02/10/18 0910 (!) 126/38 - - 60 18 98 % - -  02/10/18 0905 (!) 164/109 - - 65 16 99 % - -  02/10/18 0821 (!) 155/85 98.2 F (36.8 C) Oral 62 18 98 % 5\' 6"  (1.676 m) 111.1 kg (245 lb)     Recent laboratory studies: No results for input(s): WBC, HGB, HCT, PLT,  NA, K, CL, CO2, BUN, CREATININE, GLUCOSE, INR, CALCIUM in the last 72 hours.  Invalid input(s): PT, 2   Discharge Medications:   Allergies as of 02/11/2018   No Known Allergies     Medication List    STOP taking these medications   ibuprofen 200 MG tablet Commonly known as:  ADVIL,MOTRIN   meloxicam 15 MG tablet Commonly known as:  MOBIC     TAKE these medications   ALPRAZolam 0.25 MG tablet Commonly known as:  XANAX Take 0.25 mg by mouth at bedtime as needed for sleep.   aspirin 325 MG EC tablet Take 1 tablet (325 mg total) by mouth 2 (two) times daily after a meal. What changed:    medication strength  how much to take  when to take this   bisacodyl 5 MG EC tablet Commonly known as:  DULCOLAX Take 1 tablet (5 mg total) by mouth daily as needed for moderate constipation.   docusate sodium 100 MG capsule Commonly known as:   COLACE Take 1 capsule (100 mg total) by mouth 2 (two) times daily.   escitalopram 20 MG tablet Commonly known as:  LEXAPRO TAKE 1 TABLET(20 MG) BY MOUTH DAILY What changed:  See the new instructions.   hydrochlorothiazide 25 MG tablet Commonly known as:  HYDRODIURIL TAKE 1 TABLET(25 MG) BY MOUTH DAILY   HYDROcodone-acetaminophen 5-325 MG tablet Commonly known as:  NORCO/VICODIN Take 1-2 tablets by mouth every 4 (four) hours as needed for moderate pain ((score 4 to 6)).   lamoTRIgine 200 MG tablet Commonly known as:  LAMICTAL Take 100-200 mg by mouth See admin instructions. Take 100 mg in the morning and 200 mg in the evening   lisinopril 10 MG tablet Commonly known as:  PRINIVIL,ZESTRIL TAKE 1 TABLET(10 MG) BY MOUTH DAILY   medroxyPROGESTERone 10 MG tablet Commonly known as:  PROVERA Take 1 daily for first 10 days of month every 3 months, once it has been 1 year, of no bleeding can stop   tiZANidine 4 MG tablet Commonly known as:  ZANAFLEX Take 1 tablet (4 mg total) by mouth every 6 (six) hours as needed for muscle spasms.            Durable Medical Equipment  (From admission, onward)        Start     Ordered   02/10/18 1400  DME Walker rolling  Once    Question:  Patient needs a walker to treat with the following condition  Answer:  Primary osteoarthritis of right knee   02/10/18 1359   02/10/18 1400  DME 3 n 1  Once     02/10/18 1359   02/10/18 1400  DME Bedside commode  Once    Question:  Patient needs a bedside commode to treat with the following condition  Answer:  Primary osteoarthritis of right knee   02/10/18 1359      Diagnostic Studies: Mm Digital Screening Bilateral  Result Date: 01/15/2018 CLINICAL DATA:  Screening. EXAM: DIGITAL SCREENING BILATERAL MAMMOGRAM WITH CAD COMPARISON:  Previous exam(s). ACR Breast Density Category b: There are scattered areas of fibroglandular density. FINDINGS: There are no findings suspicious for malignancy. Images  were processed with CAD. IMPRESSION: No mammographic evidence of malignancy. A result letter of this screening mammogram will be mailed directly to the patient. RECOMMENDATION: Screening mammogram in one year. (Code:[id number]) BI-RADS CATEGORY  1: Negative. Electronically Signed   By: Frederico HammanMichelle  Collins M.D.   On: 01/15/2018 10:25    Disposition:  01-Home or Self Care  Discharge Instructions    Call MD / Call 911   Complete by:  As directed    If you experience chest pain or shortness of breath, CALL 911 and be transported to the hospital emergency room.  If you develope a fever above 101 F, pus (white drainage) or increased drainage or redness at the wound, or calf pain, call your surgeon's office.   Constipation Prevention   Complete by:  As directed    Drink plenty of fluids.  Prune juice may be helpful.  You may use a stool softener, such as Colace (over the counter) 100 mg twice a day.  Use MiraLax (over the counter) for constipation as needed.   Diet - low sodium heart healthy   Complete by:  As directed    Discharge instructions   Complete by:  As directed    INSTRUCTIONS AFTER JOINT REPLACEMENT   Remove items at home which could result in a fall. This includes throw rugs or furniture in walking pathways ICE to the affected joint every three hours while awake for 30 minutes at a time, for at least the first 3-5 days, and then as needed for pain and swelling.  Continue to use ice for pain and swelling. You may notice swelling that will progress down to the foot and ankle.  This is normal after surgery.  Elevate your leg when you are not up walking on it.   Continue to use the breathing machine you got in the hospital (incentive spirometer) which will help keep your temperature down.  It is common for your temperature to cycle up and down following surgery, especially at night when you are not up moving around and exerting yourself.  The breathing machine keeps your lungs expanded and your  temperature down.   DIET:  As you were doing prior to hospitalization, we recommend a well-balanced diet.  DRESSING / WOUND CARE / SHOWERING  You may shower 3 days after surgery, but keep the wounds dry during showering.  You may use an occlusive plastic wrap (Press'n Seal for example), NO SOAKING/SUBMERGING IN THE BATHTUB.  If the bandage gets wet, change with a clean dry gauze.  If the incision gets wet, pat the wound dry with a clean towel.  ACTIVITY  Increase activity slowly as tolerated, but follow the weight bearing instructions below.   No driving for 6 weeks or until further direction given by your physician.  You cannot drive while taking narcotics.  No lifting or carrying greater than 10 lbs. until further directed by your surgeon. Avoid periods of inactivity such as sitting longer than an hour when not asleep. This helps prevent blood clots.  You may return to work once you are authorized by your doctor.     WEIGHT BEARING   Weight bearing as tolerated with assist device (walker, cane, etc) as directed, use it as long as suggested by your surgeon or therapist, typically at least 4-6 weeks.   EXERCISES  Results after joint replacement surgery are often greatly improved when you follow the exercise, range of motion and muscle strengthening exercises prescribed by your doctor. Safety measures are also important to protect the joint from further injury. Any time any of these exercises cause you to have increased pain or swelling, decrease what you are doing until you are comfortable again and then slowly increase them. If you have problems or questions, call your caregiver or physical therapist for advice.   Rehabilitation is  important following a joint replacement. After just a few days of immobilization, the muscles of the leg can become weakened and shrink (atrophy).  These exercises are designed to build up the tone and strength of the thigh and leg muscles and to improve  motion. Often times heat used for twenty to thirty minutes before working out will loosen up your tissues and help with improving the range of motion but do not use heat for the first two weeks following surgery (sometimes heat can increase post-operative swelling).   These exercises can be done on a training (exercise) mat, on the floor, on a table or on a bed. Use whatever works the best and is most comfortable for you.    Use music or television while you are exercising so that the exercises are a pleasant break in your day. This will make your life better with the exercises acting as a break in your routine that you can look forward to.   Perform all exercises about fifteen times, three times per day or as directed.  You should exercise both the operative leg and the other leg as well.   Exercises include:   Quad Sets - Tighten up the muscle on the front of the thigh (Quad) and hold for 5-10 seconds.   Straight Leg Raises - With your knee straight (if you were given a brace, keep it on), lift the leg to 60 degrees, hold for 3 seconds, and slowly lower the leg.  Perform this exercise against resistance later as your leg gets stronger.  Leg Slides: Lying on your back, slowly slide your foot toward your buttocks, bending your knee up off the floor (only go as far as is comfortable). Then slowly slide your foot back down until your leg is flat on the floor again.  Angel Wings: Lying on your back spread your legs to the side as far apart as you can without causing discomfort.  Hamstring Strength:  Lying on your back, push your heel against the floor with your leg straight by tightening up the muscles of your buttocks.  Repeat, but this time bend your knee to a comfortable angle, and push your heel against the floor.  You may put a pillow under the heel to make it more comfortable if necessary.   A rehabilitation program following joint replacement surgery can speed recovery and prevent re-injury in the  future due to weakened muscles. Contact your doctor or a physical therapist for more information on knee rehabilitation.    CONSTIPATION  Constipation is defined medically as fewer than three stools per week and severe constipation as less than one stool per week.  Even if you have a regular bowel pattern at home, your normal regimen is likely to be disrupted due to multiple reasons following surgery.  Combination of anesthesia, postoperative narcotics, change in appetite and fluid intake all can affect your bowels.   YOU MUST use at least one of the following options; they are listed in order of increasing strength to get the job done.  They are all available over the counter, and you may need to use some, POSSIBLY even all of these options:    Drink plenty of fluids (prune juice may be helpful) and high fiber foods Colace 100 mg by mouth twice a day  Senokot for constipation as directed and as needed Dulcolax (bisacodyl), take with full glass of water  Miralax (polyethylene glycol) once or twice a day as needed.  If you have tried  all these things and are unable to have a bowel movement in the first 3-4 days after surgery call either your surgeon or your primary doctor.    If you experience loose stools or diarrhea, hold the medications until you stool forms back up.  If your symptoms do not get better within 1 week or if they get worse, check with your doctor.  If you experience "the worst abdominal pain ever" or develop nausea or vomiting, please contact the office immediately for further recommendations for treatment.   ITCHING:  If you experience itching with your medications, try taking only a single pain pill, or even half a pain pill at a time.  You can also use Benadryl over the counter for itching or also to help with sleep.   TED HOSE STOCKINGS:  Use stockings on both legs until for at least 2 weeks or as directed by physician office. They may be removed at night for  sleeping.  MEDICATIONS:  See your medication summary on the "After Visit Summary" that nursing will review with you.  You may have some home medications which will be placed on hold until you complete the course of blood thinner medication.  It is important for you to complete the blood thinner medication as prescribed.  PRECAUTIONS:  If you experience chest pain or shortness of breath - call 911 immediately for transfer to the hospital emergency department.   If you develop a fever greater that 101 F, purulent drainage from wound, increased redness or drainage from wound, foul odor from the wound/dressing, or calf pain - CONTACT YOUR SURGEON.                                                   FOLLOW-UP APPOINTMENTS:  If you do not already have a post-op appointment, please call the office for an appointment to be seen by your surgeon.  Guidelines for how soon to be seen are listed in your "After Visit Summary", but are typically between 1-4 weeks after surgery.  OTHER INSTRUCTIONS:   Knee Replacement:  Do not place pillow under knee, focus on keeping the knee straight while resting. CPM instructions: 0-90 degrees, 2 hours in the morning, 2 hours in the afternoon, and 2 hours in the evening. Place foam block, curve side up under heel at all times except when in CPM or when walking.  DO NOT modify, tear, cut, or change the foam block in any way.  MAKE SURE YOU:  Understand these instructions.  Get help right away if you are not doing well or get worse.    Thank you for letting us be a part of your medical care team.  It is a privilege we respect greatly.  We hope these instructions will help you stay on track for a fast and full recovery!   Increase activity slowly as tolerated   Complete by:  As directed       Follow-up Information    Marcene Corning, MD. Schedule an appointment as soon as possible for a visit in 2 weeks.   Specialty:  Orthopedic Surgery Contact information: [address]  [place] [address] [phone]            Signed: Drema Halon 02/11/2018, 8:06 AM   Dictation #1 [id number]  [id number]

## 2018-02-11 NOTE — Evaluation (Signed)
Occupational Therapy Evaluation Patient Details Name: [name] [name] MRN: [id number] DOB: [birth year] Today's Date: 02/11/2018    History of Present Illness Pt is a 57 y.o. female s/p elective R TKA on 02/10/18. PMH includes generalized anxiety disorder, OSA, ADHD, bipolar disorder. arthritis, obesity.    Clinical Impression   This 57 y/o F presents with the above. At baseline Pt is independent with ADLs and functional mobility. Pt completing room level functional mobility and functional transfers at RW level with MinGuard-MinA this session; currently requires ModA for LB ADLs secondary to RLE functional limitations. Pt will return home with spouse who is available to assist with ADLs PRN. Feel Pt is safe to return home once medically ready with available spouse assist; will continue to follow acutely to maximize Pt's overall safety and independence with ADLs and functional mobility prior to return home.     Follow Up Recommendations  Follow surgeon's recommendation for DC plan and follow-up therapies;Supervision/Assistance - 24 hour    Equipment Recommendations  None recommended by OT           Precautions / Restrictions Precautions Precautions: Knee Precaution Booklet Issued: Yes (comment) Precaution Comments: reviewed no pillow under knee Required Braces or Orthoses: Knee Immobilizer - Right Restrictions Weight Bearing Restrictions: Yes RLE Weight Bearing: Weight bearing as tolerated      Mobility Bed Mobility Overal bed mobility: Independent             General bed mobility comments: OOB in recliner upon arrival   Transfers Overall transfer level: Needs assistance Equipment used: Rolling walker (2 wheeled) Transfers: Sit to/from Stand Sit to Stand: Min guard         General transfer comment: Good hand placement, min guard for safety.     Balance Overall balance assessment: Needs assistance   Sitting balance-Leahy Scale: Good       Standing balance-Leahy  Scale: Fair Standing balance comment: Can static stand with no UE support                           ADL either performed or assessed with clinical judgement   ADL Overall ADL's : Needs assistance/impaired Eating/Feeding: Modified independent;Sitting   Grooming: Oral care;Min guard;Standing   Upper Body Bathing: Min guard;Sitting   Lower Body Bathing: Minimal assistance;Sit to/from stand   Upper Body Dressing : Min guard;Sitting   Lower Body Dressing: Moderate assistance;Sit to/from stand   Toilet Transfer: Min guard;Ambulation;BSC;RW Toilet Transfer Details (indicate cue type and reason): BSC over toilet; simulated in transfer to/from recliner  Toileting- Clothing Manipulation and Hygiene: Minimal assistance;Sit to/from stand   Tub/ Shower Transfer: Walk-in shower;Minimal assistance;Ambulation;3 in 1;Rolling walker;Shower Field seismologist Details (indicate cue type and reason): educated on safe transfer sequence with Pt/Pt's spouse, Pt return demonstratining with light MinA, educated to have spouse supervise/assist initially during transfer completion with Pt/pt's spouse verbalizing understanding; educated on option of using built-in shower seat and/or 3:1 during task completion  Functional mobility during ADLs: Minimal assistance;Min guard;Rolling walker General ADL Comments: Pt completing in-room functional mobility, standing grooming ADLs, simulated shower transfer; education provided on safety and compensatory techniques for completing ADLs and functional transfers                          Pertinent Vitals/Pain Pain Assessment: Faces Pain Score: 3  Faces Pain Scale: Hurts little more Pain Location: R knee Pain Descriptors / Indicators: Sore Pain Intervention(s):  Limited activity within patient's tolerance;Monitored during session;Premedicated before session          Extremity/Trunk Assessment Upper Extremity Assessment Upper Extremity  Assessment: Overall WFL for tasks assessed   Lower Extremity Assessment Lower Extremity Assessment: Defer to PT evaluation       Communication Communication Communication: No difficulties   Cognition Arousal/Alertness: Awake/alert Behavior During Therapy: WFL for tasks assessed/performed Overall Cognitive Status: Within Functional Limits for tasks assessed                                 General Comments: Patient less anxious this visit.    General Comments                  Home Living Family/patient expects to be discharged to:: Private residence Living Arrangements: Spouse/significant other;Other relatives;Children Available Help at Discharge: Family;Available 24 hours/day;Friend(s) Type of Home: House Home Access: Stairs to enter Entergy CorporationEntrance Stairs-Number of Steps: 2 Entrance Stairs-Rails: Right;Left;Can reach both Home Layout: Two level;Able to live on main level with bedroom/bathroom   Alternate Level Stairs-Rails: Right;Left Bathroom Shower/Tub: Walk-in shower         Home Equipment: Environmental consultantWalker - 2 wheels;Bedside commode;Shower seat - built in          Prior Functioning/Environment Level of Independence: Independent                 OT Problem List: Decreased strength;Decreased range of motion;Decreased knowledge of precautions;Decreased knowledge of use of DME or AE;Decreased activity tolerance      OT Treatment/Interventions: Self-care/ADL training;DME and/or AE instruction;Therapeutic activities;Balance training;Therapeutic exercise;Energy conservation;Patient/family education    OT Goals(Current goals can be found in the care plan section) Acute Rehab OT Goals Patient Stated Goal: Get back to playing with grandkids asap OT Goal Formulation: With patient Time For Goal Achievement: 02/25/18 Potential to Achieve Goals: Good  OT Frequency: Min 2X/week                             AM-PAC PT "6 Clicks" Daily Activity      Outcome Measure Help from another person eating meals?: None Help from another person taking care of personal grooming?: A Little Help from another person toileting, which includes using toliet, bedpan, or urinal?: A Little Help from another person bathing (including washing, rinsing, drying)?: A Little Help from another person to put on and taking off regular upper body clothing?: None Help from another person to put on and taking off regular lower body clothing?: A Lot 6 Click Score: 19   End of Session Equipment Utilized During Treatment: Gait belt;Rolling walker;Right knee immobilizer Nurse Communication: Mobility status  Activity Tolerance: Patient tolerated treatment well Patient left: in chair;with call bell/phone within reach;with family/visitor present  OT Visit Diagnosis: Other abnormalities of gait and mobility (R26.89)                Time: [id number] OT Time Calculation (min): 21 min Charges:  OT General Charges $OT Visit: 1 Visit OT Evaluation $OT Eval Low Complexity: 1 Low G-Codes:     Marcy SirenBreanna Nate Perri, OT Pager [phone] 02/11/2018   Orlando PennerBreanna L Karlina Suares 02/11/2018, 4:09 PM

## 2018-02-11 NOTE — Plan of Care (Signed)
  Adequate for Discharge Education: Knowledge of General Education information will improve 02/11/2018 1218 - Adequate for Discharge by Krayton Wortley, Arline AspMeredith P, RN Health Behavior/Discharge Planning: Ability to manage health-related needs will improve 02/11/2018 1218 - Adequate for Discharge by Klare Criss, Arline AspMeredith P, RN Clinical Measurements: Ability to maintain clinical measurements within normal limits will improve 02/11/2018 1218 - Adequate for Discharge by Brigitt Mcclish, Arline AspMeredith P, RN Will remain free from infection 02/11/2018 1218 - Adequate for Discharge by Andy Gaussunzweiler, Wilber Fini P, RN Diagnostic test results will improve 02/11/2018 1218 - Adequate for Discharge by Declan Adamson, Arline AspMeredith P, RN Respiratory complications will improve 02/11/2018 1218 - Adequate for Discharge by Andy Gaussunzweiler, Cebastian Neis P, RN Cardiovascular complication will be avoided 02/11/2018 1218 - Adequate for Discharge by Andy Gaussunzweiler, Sai Zinn P, RN Activity: Risk for activity intolerance will decrease 02/11/2018 1218 - Adequate for Discharge by Andy Gaussunzweiler, Tonantzin Mimnaugh P, RN Nutrition: Adequate nutrition will be maintained 02/11/2018 1218 - Adequate for Discharge by Andy Gaussunzweiler, Woodson Macha P, RN Coping: Level of anxiety will decrease 02/11/2018 1218 - Adequate for Discharge by Merelin Human, Arline AspMeredith P, RN Elimination: Will not experience complications related to bowel motility 02/11/2018 1218 - Adequate for Discharge by Louanne Calvillo, Arline AspMeredith P, RN Will not experience complications related to urinary retention 02/11/2018 1218 - Adequate for Discharge by Thurza Kwiecinski, Arline AspMeredith P, RN Pain Managment: General experience of comfort will improve 02/11/2018 1218 - Adequate for Discharge by Andy Gaussunzweiler, Reginal Wojcicki P, RN Safety: Ability to remain free from injury will improve 02/11/2018 1218 - Adequate for Discharge by Andy Gaussunzweiler, Evanee Lubrano P, RN Skin Integrity: Risk for impaired skin integrity will decrease 02/11/2018 1218 - Adequate for Discharge by  Andy Gaussunzweiler, Braileigh Landenberger P, RN Education: Knowledge of the prescribed therapeutic regimen will improve 02/11/2018 1218 - Adequate for Discharge by Lateisha Thurlow, Arline AspMeredith P, RN Activity: Ability to avoid complications of mobility impairment will improve 02/11/2018 1218 - Adequate for Discharge by Andy Gaussunzweiler, Antwuan Eckley P, RN Range of joint motion will improve 02/11/2018 1218 - Adequate for Discharge by Jibran Crookshanks, Arline AspMeredith P, RN Clinical Measurements: Postoperative complications will be avoided or minimized 02/11/2018 1218 - Adequate for Discharge by Andy Gaussunzweiler, Peretz Thieme P, RN Pain Management: Pain level will decrease with appropriate interventions 02/11/2018 1218 - Adequate for Discharge by Andy Gaussunzweiler, Alianny Toelle P, RN Skin Integrity: Signs of wound healing will improve 02/11/2018 1218 - Adequate for Discharge by Linzy Laury, Arline AspMeredith P, RN

## 2018-02-11 NOTE — Progress Notes (Signed)
Visit made to patients room to set up on CPAP.  Pt states they will be getting her up off and on very soon and would rather not at this time she feels its to late.  I advised patient I had machine right outside her door and would be glad to set her up if she changes her mind to advise RN.

## 2018-02-11 NOTE — Progress Notes (Signed)
Physical Therapy Treatment Patient Details Name: [name] [name] MRN: [id number] DOB: 1961/09/24 Today's Date: 02/11/2018    History of Present Illness Pt is a 57 y.o. female s/p elective R TKA on 02/10/18. PMH includes generalized anxiety disorder, OSA, ADHD, bipolar disorder. arthritis, obesity.     PT Comments    Patient progressing with therapy today. Ambulating with KI and RW further distances in hallway with no LOB or issues, improved mechanics and supervision level assistance. Next visit early PM will focus on stair training before safe return home.  Anticipate patient will do well.   Follow Up Recommendations  Home health PT;Supervision for mobility/OOB     Equipment Recommendations  None recommended by PT    Recommendations for Other Services OT consult     Precautions / Restrictions Precautions Precautions: Knee Precaution Booklet Issued: Yes (comment) Precaution Comments: reviewed supine therex and no pillow under knee Required Braces or Orthoses: Knee Immobilizer - Right Restrictions Weight Bearing Restrictions: Yes RLE Weight Bearing: Weight bearing as tolerated    Mobility  Bed Mobility Overal bed mobility: Independent                Transfers Overall transfer level: Needs assistance Equipment used: Rolling walker (2 wheeled) Transfers: Sit to/from Stand Sit to Stand: Min guard         General transfer comment: Good hand placement, min guard for safety.   Ambulation/Gait Ambulation/Gait assistance: Min guard;Supervision Ambulation Distance (Feet): 200 Feet Assistive device: Rolling walker (2 wheeled) Gait Pattern/deviations: Step-through pattern;Decreased stride length;Decreased weight shift to right;Antalgic Gait velocity: Decreased   General Gait Details: patient with improving mechanics, now intermittent step through and heel strike. no LOB or falls, increased distance without rest breaks.    Stairs            Wheelchair Mobility     Modified Rankin (Stroke Patients Only)       Balance Overall balance assessment: Needs assistance   Sitting balance-Leahy Scale: Good       Standing balance-Leahy Scale: Fair Standing balance comment: Can static stand with no UE support                            Cognition Arousal/Alertness: Awake/alert Behavior During Therapy: WFL for tasks assessed/performed Overall Cognitive Status: Within Functional Limits for tasks assessed                                 General Comments: Patient less anxious this visit.       Exercises Total Joint Exercises Ankle Circles/Pumps: 20 reps Quad Sets: 10 reps Heel Slides: 10 reps Long Arc Quad: 5 reps Knee Flexion: 10 reps Goniometric ROM: 90* flexion    General Comments        Pertinent Vitals/Pain Pain Assessment: 0-10 Pain Score: 3  Pain Location: R knee Pain Descriptors / Indicators: Sore Pain Intervention(s): Limited activity within patient's tolerance;Monitored during session;Premedicated before session;Repositioned    Home Living                      Prior Function            PT Goals (current goals can now be found in the care plan section) Acute Rehab PT Goals Patient Stated Goal: Get back to playing with grandkids asap PT Goal Formulation: With patient Time For Goal Achievement: 02/24/18 Potential to Achieve  Goals: Good Progress towards PT goals: Progressing toward goals    Frequency    7X/week      PT Plan Current plan remains appropriate    Co-evaluation              AM-PAC PT "6 Clicks" Daily Activity  Outcome Measure  Difficulty turning over in bed (including adjusting bedclothes, sheets and blankets)?: None Difficulty moving from lying on back to sitting on the side of the bed? : None Difficulty sitting down on and standing up from a chair with arms (e.g., wheelchair, bedside commode, etc,.)?: A Little Help needed moving to and from a bed to chair  (including a wheelchair)?: A Little Help needed walking in hospital room?: A Little Help needed climbing 3-5 steps with a railing? : A Little 6 Click Score: 20    End of Session Equipment Utilized During Treatment: Gait belt Activity Tolerance: Patient tolerated treatment well Patient left: in chair;with call bell/phone within reach;with family/visitor present;with nursing/sitter in room Nurse Communication: Mobility status PT Visit Diagnosis: Other abnormalities of gait and mobility (R26.89);Pain Pain - Right/Left: Right Pain - part of body: Knee     Time: [id number] PT Time Calculation (min) (ACUTE ONLY): 34 min  Charges:  $Gait Training: 8-22 mins $Therapeutic Exercise: 8-22 mins                    G Codes:      Etta GrandchildSean Sonya Pucci, PT, DPT Acute Rehab Services Pager: [phone]     Etta GrandchildSean  Kyzen Horn 02/11/2018, 9:33 AM

## 2018-02-11 NOTE — Care Management Note (Signed)
Case Management Note  Patient Details  Name: [name] [name] MRN: [id number] Date of Birth: [birth year]  Subjective/Objective:    57 yr old female s/p right total knee arthroplasty.              Action/Plan: Case manager spoke with patient and her husband concerning discharge plan and DME. Choice for Home Health Agency was offered, patient requests [facility], CM called referral to Ayesha RumpfMary Yonjof, [facility]. Patient will have family support at discharge.    Expected Discharge Date:  02/11/18               Expected Discharge Plan:  Home w Home Health Services  In-House Referral:  NA  Discharge planning Services  CM Consult  Post Acute Care Choice:  Home Health Choice offered to:  Patient  DME Arranged:  3-N-1, Walker rolling DME Agency:  [employer]  HH Arranged:  PT HH [facility]  [facility]  Status of Service:  Completed, signed off  If discussed at [employer] Meetings, dates discussed:    Additional Comments:  Durenda GuthrieBrady, Zyliah Schier Naomi, RN 02/11/2018, 2:17 PM

## 2018-02-11 NOTE — Progress Notes (Signed)
Subjective: 1 Day Post-Op Procedure(s) (LRB): TOTAL KNEE ARTHROPLASTY (Right)  Activity level:  wbat Diet tolerance:  ok Voiding:  ok Patient reports pain as mild.    Objective: Vital signs in last 24 hours: Temp:  [97.6 F (36.4 C)-98.3 F (36.8 C)] 98.3 F (36.8 C) (02/13 0500) Pulse Rate:  [56-68] 56 (02/13 0500) Resp:  [16-25] 18 (02/12 1952) BP: (111-169)/(38-109) 131/54 (02/13 0500) SpO2:  [97 %-100 %] 98 % (02/13 0500) Weight:  [111.1 kg (245 lb)] 111.1 kg (245 lb) (02/12 0821)  Labs: No results for input(s): HGB in the last 72 hours. No results for input(s): WBC, RBC, HCT, PLT in the last 72 hours. No results for input(s): NA, K, CL, CO2, BUN, CREATININE, GLUCOSE, CALCIUM in the last 72 hours. No results for input(s): LABPT, INR in the last 72 hours.  Physical Exam:  Neurologically intact ABD soft Neurovascular intact Sensation intact distally Intact pulses distally Dorsiflexion/Plantar flexion intact Incision: dressing C/D/I No cellulitis present Compartment soft  Assessment/Plan:  1 Day Post-Op Procedure(s) (LRB): TOTAL KNEE ARTHROPLASTY (Right) Advance diet Up with therapy Discharge home with home health today after 2 rounds of PT if doing well. Follow up in office 2 weeks post op. Continue on ASA 325mg  BID x 2 weeks post op.   Pearse Shiffler, Ginger OrganNDREW PAUL 02/11/2018, 7:59 AM

## 2018-02-11 NOTE — Progress Notes (Signed)
Physical Therapy Treatment Patient Details Name: [name] [name] MRN: [id number] DOB: 1961/12/26 Today's Date: 02/11/2018    History of Present Illness Pt is a 57 y.o. female s/p elective R TKA on 02/10/18. PMH includes generalized anxiety disorder, OSA, ADHD, bipolar disorder. arthritis, obesity.     PT Comments    Session focused on stair training and improving independence with transfers. Patient now supervision ambulating stairs. Pt and family educated on safety considerations for home and have no further questions or concerns at this time. Pt has met all functional goals and will benefit from skilled home health PT when medically cleared for d/c.     Follow Up Recommendations  Home health PT;Supervision for mobility/OOB     Equipment Recommendations  None recommended by PT    Recommendations for Other Services       Precautions / Restrictions Precautions Precautions: Knee Precaution Booklet Issued: Yes (comment) Precaution Comments: reviewed supine therex and no pillow under knee Required Braces or Orthoses: Knee Immobilizer - Right Restrictions Weight Bearing Restrictions: Yes RLE Weight Bearing: Weight bearing as tolerated    Mobility  Bed Mobility Overal bed mobility: Independent                Transfers Overall transfer level: Modified independent Equipment used: Rolling walker (2 wheeled)                Ambulation/Gait Ambulation/Gait assistance: Min guard;Supervision Ambulation Distance (Feet): 50 Feet Assistive device: Rolling walker (2 wheeled) Gait Pattern/deviations: Step-through pattern;Decreased stride length;Decreased weight shift to right;Antalgic Gait velocity: decreased       Stairs Stairs: Yes   Stair Management: One rail Right;Sideways;Step to pattern Number of Stairs: 16 General stair comments: patient progresed to supervision, cues for sequencing and technique with BUE support on 1 rail sideways.   Wheelchair Mobility     Modified Rankin (Stroke Patients Only)       Balance Overall balance assessment: Needs assistance   Sitting balance-Leahy Scale: Good       Standing balance-Leahy Scale: Fair Standing balance comment: Can static stand with no UE support                            Cognition Arousal/Alertness: Awake/alert Behavior During Therapy: WFL for tasks assessed/performed Overall Cognitive Status: Within Functional Limits for tasks assessed                                 General Comments: Patient less anxious this visit.       Exercises      General Comments        Pertinent Vitals/Pain Pain Assessment: 0-10 Pain Score: 3  Pain Location: R knee Pain Descriptors / Indicators: Sore Pain Intervention(s): Limited activity within patient's tolerance;Monitored during session;Premedicated before session;Repositioned    Home Living                      Prior Function            PT Goals (current goals can now be found in the care plan section) Acute Rehab PT Goals Patient Stated Goal: Get back to playing with grandkids asap PT Goal Formulation: With patient Time For Goal Achievement: 02/24/18 Potential to Achieve Goals: Good Progress towards PT goals: Goals met/education completed, patient discharged from PT    Frequency  PT Plan Current plan remains appropriate    Co-evaluation              AM-PAC PT "6 Clicks" Daily Activity  Outcome Measure  Difficulty turning over in bed (including adjusting bedclothes, sheets and blankets)?: None Difficulty moving from lying on back to sitting on the side of the bed? : None Difficulty sitting down on and standing up from a chair with arms (e.g., wheelchair, bedside commode, etc,.)?: A Little Help needed moving to and from a bed to chair (including a wheelchair)?: A Little Help needed walking in hospital room?: A Little Help needed climbing 3-5 steps with a railing? : A  Little 6 Click Score: 20    End of Session Equipment Utilized During Treatment: Gait belt Activity Tolerance: Patient tolerated treatment well Patient left: in chair;with call bell/phone within reach;with family/visitor present;with nursing/sitter in room Nurse Communication: Mobility status PT Visit Diagnosis: Other abnormalities of gait and mobility (R26.89);Pain Pain - Right/Left: Right Pain - part of body: Knee     Time: 1437-1512 PT Time Calculation (min) (ACUTE ONLY): 35 min  Charges:  $Gait Training: 8-22 mins $Therapeutic Activity: 8-22 mins                    G Codes:      Sean Donnelly, PT, DPT Acute Rehab Services Pager: [phone]     Sean  Donnelly 02/11/2018, 3:47 PM   

## 2018-02-11 NOTE — Plan of Care (Signed)
Adequate for Discharge Education: Knowledge of General Education information will improve 02/11/2018 1517 - Adequate for Discharge by Andy Gauss, RN 02/11/2018 1218 - Adequate for Discharge by Antonino Nienhuis, Arline Asp, RN Health Behavior/Discharge Planning: Ability to manage health-related needs will improve 02/11/2018 1517 - Adequate for Discharge by Andy Gauss, RN 02/11/2018 1218 - Adequate for Discharge by Weda Baumgarner, Arline Asp, RN Clinical Measurements: Ability to maintain clinical measurements within normal limits will improve 02/11/2018 1517 - Adequate for Discharge by Andy Gauss, RN 02/11/2018 1218 - Adequate for Discharge by Andy Gauss, RN Will remain free from infection 02/11/2018 1517 - Adequate for Discharge by Andy Gauss, RN 02/11/2018 1218 - Adequate for Discharge by Andy Gauss, RN Diagnostic test results will improve 02/11/2018 1517 - Adequate for Discharge by Andy Gauss, RN 02/11/2018 1218 - Adequate for Discharge by Andy Gauss, RN Respiratory complications will improve 02/11/2018 1517 - Adequate for Discharge by Andy Gauss, RN 02/11/2018 1218 - Adequate for Discharge by Andy Gauss, RN Cardiovascular complication will be avoided 02/11/2018 1517 - Adequate for Discharge by Andy Gauss, RN 02/11/2018 1218 - Adequate for Discharge by Andy Gauss, RN Activity: Risk for activity intolerance will decrease 02/11/2018 1517 - Adequate for Discharge by Andy Gauss, RN 02/11/2018 1218 - Adequate for Discharge by Andy Gauss, RN Nutrition: Adequate nutrition will be maintained 02/11/2018 1517 - Adequate for Discharge by Andy Gauss, RN 02/11/2018 1218 - Adequate for Discharge by Andy Gauss, RN Coping: Level of anxiety will decrease 02/11/2018 1517 - Adequate for Discharge by Andy Gauss, RN 02/11/2018  1218 - Adequate for Discharge by Andy Gauss, RN Elimination: Will not experience complications related to bowel motility 02/11/2018 1517 - Adequate for Discharge by Andy Gauss, RN 02/11/2018 1218 - Adequate for Discharge by Andy Gauss, RN Will not experience complications related to urinary retention 02/11/2018 1517 - Adequate for Discharge by Andy Gauss, RN 02/11/2018 1218 - Adequate for Discharge by Andy Gauss, RN Pain Managment: General experience of comfort will improve 02/11/2018 1517 - Adequate for Discharge by Andy Gauss, RN 02/11/2018 1218 - Adequate for Discharge by Andy Gauss, RN Safety: Ability to remain free from injury will improve 02/11/2018 1517 - Adequate for Discharge by Andy Gauss, RN 02/11/2018 1218 - Adequate for Discharge by Andy Gauss, RN Skin Integrity: Risk for impaired skin integrity will decrease 02/11/2018 1517 - Adequate for Discharge by Andy Gauss, RN 02/11/2018 1218 - Adequate for Discharge by Andy Gauss, RN Education: Knowledge of the prescribed therapeutic regimen will improve 02/11/2018 1517 - Adequate for Discharge by Andy Gauss, RN 02/11/2018 1218 - Adequate for Discharge by Andy Gauss, RN Activity: Ability to avoid complications of mobility impairment will improve 02/11/2018 1517 - Adequate for Discharge by Andy Gauss, RN 02/11/2018 1218 - Adequate for Discharge by Andy Gauss, RN Range of joint motion will improve 02/11/2018 1517 - Adequate for Discharge by Andy Gauss, RN 02/11/2018 1218 - Adequate for Discharge by Andy Gauss, RN Clinical Measurements: Postoperative complications will be avoided or minimized 02/11/2018 1517 - Adequate for Discharge by Andy Gauss, RN 02/11/2018 1218 - Adequate for Discharge by Andy Gauss, RN Pain  Management: Pain level will decrease with appropriate interventions 02/11/2018 1517 - Adequate for Discharge by Andy Gauss, RN 02/11/2018 1218 - Adequate for Discharge by Terrell Ostrand, Arline Asp, RN Skin  Integrity: Signs of wound healing will improve 02/11/2018 1517 - Adequate for Discharge by Andy Gaussunzweiler, Athira Janowicz P, RN 02/11/2018 1218 - Adequate for Discharge by Loyde Orth, Arline AspMeredith P, RN

## 2018-02-11 NOTE — Anesthesia Postprocedure Evaluation (Signed)
Anesthesia Post Note  Patient: [name] [name]  Procedure(s) Performed: TOTAL KNEE ARTHROPLASTY (Right )     Patient location during evaluation: PACU Anesthesia Type: Spinal Level of consciousness: awake Pain management: satisfactory to patient Vital Signs Assessment: post-procedure vital signs reviewed and stable Respiratory status: spontaneous breathing Cardiovascular status: blood pressure returned to baseline Postop Assessment: no headache and spinal receding Anesthetic complications: no    Last Vitals:  Vitals:   02/10/18 1952 02/11/18 0500  BP: 111/81 (!) 131/54  Pulse: 66 (!) 56  Resp: 18   Temp: 36.8 C 36.8 C  SpO2: 97% 98%    Last Pain:  Vitals:   02/11/18 0522  TempSrc:   PainSc: 5    Pain Goal: Patients Stated Pain Goal: 3 (02/11/18 0422)               Jiles GarterJACKSON,Arshad Oberholzer EDWARD

## 2018-02-12 LAB — TYPE AND SCREEN
ABO/RH(D): A POS
ANTIBODY SCREEN: POSITIVE
DONOR AG TYPE: NEGATIVE
Donor AG Type: NEGATIVE
PT AG TYPE: NEGATIVE
UNIT DIVISION: 0
Unit division: 0

## 2018-02-12 LAB — BPAM RBC
BLOOD PRODUCT EXPIRATION DATE: 201903072359
BLOOD PRODUCT EXPIRATION DATE: 201903092359
UNIT TYPE AND RH: 6200
Unit Type and Rh: 5100

## 2018-02-16 ENCOUNTER — Encounter (HOSPITAL_COMMUNITY): Payer: Self-pay | Admitting: Orthopaedic Surgery

## 2018-02-26 ENCOUNTER — Ambulatory Visit (HOSPITAL_COMMUNITY): Payer: Commercial Managed Care - PPO | Attending: Orthopaedic Surgery

## 2018-02-26 ENCOUNTER — Encounter (HOSPITAL_COMMUNITY): Payer: Self-pay

## 2018-02-26 DIAGNOSIS — M6281 Muscle weakness (generalized): Secondary | ICD-10-CM | POA: Diagnosis present

## 2018-02-26 DIAGNOSIS — R262 Difficulty in walking, not elsewhere classified: Secondary | ICD-10-CM | POA: Insufficient documentation

## 2018-02-26 DIAGNOSIS — M25661 Stiffness of right knee, not elsewhere classified: Secondary | ICD-10-CM | POA: Diagnosis not present

## 2018-02-26 DIAGNOSIS — R600 Localized edema: Secondary | ICD-10-CM | POA: Insufficient documentation

## 2018-02-26 NOTE — Therapy (Signed)
[facility] [address] [place], [place], [address] Phone: [phone]   Fax:  [phone]  Physical Therapy Evaluation  Patient Details  Name: [name] [name] MRN: [id number] Date of Birth: 01/15/1961 Referring Provider: Marcene Corning, MD   Encounter Date: 02/26/2018  PT End of Session - 02/26/18 1214    Visit Number  1    Number of Visits  19    Date for PT Re-Evaluation  03/19/18    Authorization Type  [facility]    Authorization Time Period  02/26/18 to 04/09/18    PT Start Time  0818    PT Stop Time  0859    PT Time Calculation (min)  41 min    Activity Tolerance  Patient tolerated treatment well    Behavior During Therapy  [facility] for tasks assessed/performed       Past Medical History:  Diagnosis Date  . Abnormal Pap smear   . Adult ADHD   . Anxiety   . Arthritis    "knees" (02/10/2018)  . Bipolar disorder (HCC)   . Cholelithiases    04/2015  . Depression 04/15/2013  . Ear infection 03/19/2016  . Essential hypertension, benign   . ETOH abuse    drinks wine 5 day/week  . GAD (generalized anxiety disorder)   . History of blood transfusion 1980s   "related to miscarriage"  . Incompetent cervix   . Obesity   . OSA on CPAP   . Pneumonia    "used to have it alot; nothing in years" (02/10/2018)  . PTSD (post-traumatic stress disorder)   . Thickened endometrium 05/02/2014  . Vaginal Pap smear, abnormal   . Vertigo 03/19/2016    Past Surgical History:  Procedure Laterality Date  . CERVICAL CERCLAGE    . CHOLECYSTECTOMY N/A 05/24/2015   Procedure: LAPAROSCOPIC CHOLECYSTECTOMY WITH INTRAOPERATIVE CHOLANGIOGRAM;  Surgeon: Franky Macho Md, MD;  Location: [facility];  Service: General;  Laterality: N/A;  . DILATION AND CURETTAGE OF UTERUS    . JOINT REPLACEMENT    . KNEE ARTHROSCOPY Right 2015  . TONSILLECTOMY    . 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 TOTAL KNEE ARTHROPLASTY Right 02/10/2018   Procedure: TOTAL KNEE ARTHROPLASTY;   Surgeon: Marcene Corning, MD;  Location: [facility] OR;  Service: Orthopedics;  Laterality: Right;    There were no vitals filed for this visit.   Subjective Assessment - 02/26/18 0821    Subjective  Pt reports undergoing R TKA on 02/10/18 by Dr. Jerl Santos. Pain and bone on bone is what led to her TKA. She was d/c from HHPT yesterday; it went very well. She was not using any AD prior to the surgery and she currently presents with a SPC. She denies a lot of pain, just increased stiffness. The stiffness is her main complaint. She keeps her grandchildren 3 days/week and loves being outdoors so she would love to get back to that.     Limitations  Walking;Standing    How long can you sit comfortably?  no issues    How long can you stand comfortably?  no issues    How long can you walk comfortably?  for a while    Patient Stated Goals  get back to normal    Currently in Pain?  Yes stiffness    Pain Score  7     Pain Location  Knee    Pain Orientation  Right    Pain Descriptors / Indicators  Tightness  Pain Type  Surgical pain    Pain Onset  1 to 4 weeks ago    Pain Frequency  Intermittent         [facility] PT Assessment - 02/26/18 0001      Assessment   Medical Diagnosis  R TKA    Referring Provider  Marcene Corning, MD    Onset Date/Surgical Date  02/10/18    Next MD Visit  -- March 2019    Prior Therapy  HHPT, d/c 02/25/18      Balance Screen   Has the patient fallen in the past 6 months  Yes    How many times?  fell during hospital stay during TKA    Has the patient had a decrease in activity level because of a fear of falling?   No    Is the patient reluctant to leave their home because of a fear of falling?   No      Prior Function   Level of Independence  Independent    Vocation  Retired    Leisure  keep grandkids, hiking, outdoors, swimming      Observation/Other Assessments   Focus on Therapeutic Outcomes (FOTO)   52% limitation      Observation/Other Assessments-Edema    Edema   Circumferential      Circumferential Edema   Circumferential - Right  49mm joint line    Circumferential - Left   44.58mm joint line      ROM / Strength   AROM / PROM / Strength  AROM;Strength      AROM   AROM Assessment Site  Knee    Right/Left Knee  Right    Right Knee Extension  14    Right Knee Flexion  90      Strength   Strength Assessment Site  Hip;Knee;Ankle    Right Hip Flexion  4+/5    Right Hip Extension  4/5    Right Hip ABduction  4+/5    Left Hip Flexion  5/5    Left Hip Extension  4/5    Left Hip ABduction  4/5    Right Knee Flexion  4-/5    Right Knee Extension  4-/5    Left Knee Flexion  4+/5    Left Knee Extension  5/5    Right Ankle Dorsiflexion  5/5    Left Ankle Dorsiflexion  5/5      Palpation   Patella mobility  hypomobile sup/inf    Palpation comment  soft tissue restrictions of R distal quad, non-tender to palpation, increased tightness noted, likely due to edema      Ambulation/Gait   Ambulation Distance (Feet)  440 Feet    Assistive device  Straight cane    Gait Pattern  Step-through pattern;Decreased stance time - right;Decreased step length - left;Decreased hip/knee flexion - right;Trendelenburg;Antalgic;Lateral trunk lean to right decreased knee ext at midstance on R      Balance   Balance Assessed  Yes      Static Standing Balance   Static Standing - Balance Support  No upper extremity supported    Static Standing Balance -  Activities   Single Leg Stance - Right Leg;Single Leg Stance - Left Leg    Static Standing - Comment/# of Minutes  R: 2.5 sec L: 19.7 sec      Standardized Balance Assessment   Standardized Balance Assessment  Five Times Sit to Stand;Timed Up and Go Test    Five times sit  to stand comments   12.6 sec, no UE, decreased weight shift over RLE      Timed Up and Go Test   TUG  Normal TUG    Normal TUG (seconds)  13 SPC         Objective measurements completed on examination: See above findings.       PT  Education - 02/26/18 1214    Education provided  Yes    Education Details  exam findings, POC, HEP    Person(s) Educated  Patient    Methods  Explanation;Demonstration;Handout    Comprehension  Verbalized understanding;Returned demonstration       PT Short Term Goals - 02/26/18 1227      PT SHORT TERM GOAL #1   Title  Pt will be independent with HEP and perform consistently in order to maximize ROM and MMT.    Time  3    Period  Weeks    Status  New    Target Date  03/19/18      PT SHORT TERM GOAL #2   Title  Pt will have improved knee AROM from 5-100deg to decrease pain and maximize gait.     Time  3    Period  Weeks    Status  New      PT SHORT TERM GOAL #3   Title  Pt will have decreased edema at joint line by 5mm in order to decrease stiffness and maximzie ROM.     Time  3    Period  Weeks    Status  New      PT SHORT TERM GOAL #4   Title  Pt will be able to perform 5xSTS in 10 sec or < with no UE and proper mechanics to demo improved functional BLE strength and balance.    Time  3    Period  Weeks    Status  New        PT Long Term Goals - 02/26/18 1230      PT LONG TERM GOAL #1   Title  Pt will have improved R knee AROM from 0-120deg in order to further maximize gait and stair ambulation.    Time  6    Period  Weeks    Status  New    Target Date  04/09/18      PT LONG TERM GOAL #2   Title  Pt will have 1 grade improvement in MMT throughout in order to maximize balance, gait, and promote return to PLOF.    Time  6    Period  Weeks    Status  New      PT LONG TERM GOAL #3   Title  Pt will be able to perform R SLS for at least 15 sec or > to demo improve balance and maximize gait and stair ambulation.    Time  6    Period  Weeks    Status  New      PT LONG TERM GOAL #4   Title  Pt will be able to ambualte at lest 677ft during the with LRAD and gait WFL in order to maximize community access and participation.     Time  6    Period  Weeks     Status  New             Plan - 02/26/18 1223    Clinical Impression Statement  Pt is pleasant 56YO F who presents to OPPT  s/p R TKA by Dr. Jerl Santosalldorf on 02/10/18. She currently presents with post-op deficits in ROM, MMT, edema, functional strength, balance, gait, and completion of functional tasks. Her scar is well healed with no redness or warmth noted; it is slightly hypomobile, mainly sup/inf direction. Pt needs skilled PT intervention to address deficits detailed in this note in order to maximize ROM and promote return to PLOF.    History and Personal Factors relevant to plan of care:  motivated to participate, young, active PLOF; chronicity of knee pain prior to surgery, HTN, PTSD, vertigo, arthritis    Clinical Presentation  Stable    Clinical Presentation due to:  ROM, MMT, SLS, 3MWT, TUG, 5xSTS, edema, functional strength    Clinical Decision Making  Low    Rehab Potential  Good    PT Frequency  3x / week    PT Duration  6 weeks    PT Treatment/Interventions  ADLs/Self Care Home Management;Cryotherapy;Electrical Stimulation;Moist Heat;Ultrasound;DME Instruction;Gait training;Stair training;Functional mobility training;Therapeutic activities;Therapeutic exercise;Balance training;Neuromuscular re-education;Patient/family education;Manual techniques;Scar mobilization;Passive range of motion;Dry needling;Energy conservation;Taping;Vestibular    PT Next Visit Plan  review goals and HEP; intitiate HS, calf stretching, heel slides, SAQ and other mobilty exercises initially; manual for edema control    PT Home Exercise Plan  eval: quad set, HS stretch, SLS    Recommended Other Services  potentially thigh-high compression garment if edema not responding well to manual    Consulted and Agree with Plan of Care  Patient       Patient will benefit from skilled therapeutic intervention in order to improve the following deficits and impairments:  Abnormal gait, Decreased activity tolerance,  Decreased balance, Decreased endurance, Decreased mobility, Decreased range of motion, Decreased strength, Difficulty walking, Hypomobility, Increased edema, Increased fascial restricitons, Increased muscle spasms, Impaired flexibility, Improper body mechanics, Pain  Visit Diagnosis: Stiffness of right knee, not elsewhere classified - Plan: PT plan of care cert/re-cert  Localized edema - Plan: PT plan of care cert/re-cert  Muscle weakness (generalized) - Plan: PT plan of care cert/re-cert  Difficulty in walking, not elsewhere classified - Plan: PT plan of care cert/re-cert     Problem List Patient Active Problem List   Diagnosis Date Noted  . Primary localized osteoarthritis of right knee 02/10/2018  . Primary osteoarthritis of right knee 02/10/2018  . Body mass index 40.0-44.9, adult (HCC) 05/05/2017  . Vertigo 03/19/2016  . Ear infection 03/19/2016  . Elevated LFTs 05/11/2015  . Hyperglycemia 05/11/2015  . Obesity 05/11/2015  . Tobacco abuse 05/11/2015  . Cholelithiases   . Thickened endometrium 05/02/2014  . Depression 04/15/2013  . HTN (hypertension) 04/14/2013  . Anxiety 04/14/2013  . OSA (obstructive sleep apnea) 03/20/2012  . Essential hypertension, benign 11/05/2011  . Nonspecific abnormal electrocardiogram (ECG) (EKG) 11/05/2011  . Pain in limb 11/05/2011       Jac CanavanBrooke Powell PT, DPT  [facility] [address] [place], [place], [id number] Phone: [phone]   Fax:  [phone]  Name: [name] [name] MRN: [id number] Date of Birth: 04-Sep-1961

## 2018-02-26 NOTE — Patient Instructions (Signed)
  QUAD SET  Tighten your top thigh muscle as you attempt to press the back of your knee downward towards the table.  Perform 2x/day, 2-3 sets of 10-15 reps holding for 5-10 reps   HAMSTRING STRETCH WITH TOWEL  While lying down on your back, hook a towel or strap under  your foot and draw up your leg until a stretch is felt along the backside of your leg.   Keep your knee in a straightened position during the stretch.  Perform 2x/day, 3-5 stretches holding for 30-60 seconds each   SINGLE LEG STANCE - SLS  Stand on one leg and maintain your balance.  Perform 1x/day, 10-15 reps holding for as long as you can.

## 2018-03-02 ENCOUNTER — Ambulatory Visit (HOSPITAL_COMMUNITY): Payer: Commercial Managed Care - PPO | Attending: Orthopaedic Surgery

## 2018-03-02 ENCOUNTER — Encounter (HOSPITAL_COMMUNITY): Payer: Self-pay

## 2018-03-02 DIAGNOSIS — M6281 Muscle weakness (generalized): Secondary | ICD-10-CM

## 2018-03-02 DIAGNOSIS — R600 Localized edema: Secondary | ICD-10-CM

## 2018-03-02 DIAGNOSIS — R262 Difficulty in walking, not elsewhere classified: Secondary | ICD-10-CM

## 2018-03-02 DIAGNOSIS — M25661 Stiffness of right knee, not elsewhere classified: Secondary | ICD-10-CM

## 2018-03-02 NOTE — Patient Instructions (Signed)
KNEE: Extension, Short Arc Quads - Supine    Place bolster under knees. Raise one leg until knee is straight. _10__ reps per set, 2___ sets per day.   Copyright  VHI. All rights reserved.  Calf / Gastoc: Runners' Stretch I    One leg back and straight, other forward and bent supporting weight, lean forward, gently stretching calf of back leg. Hold 60____ seconds. Repeat with other leg. Repeat 1____ times. Do _2___ sessions per day.  Copyright  VHI. All rights reserved.  Bracing With Heel Slides (Supine)    With neutral spine, tighten pelvic floor and abdominals and hold. Alternating legs, slide heel to bottom. Repeat 10___ times. Do 2___ times a day.   Copyright  VHI. All rights reserved.  Hamstring Stretch (Standing)    Standing, place one heel on chair or bench. Use one or both hands on thigh for support. Keeping torso straight, lean forward slowly until a stretch is felt in back of same thigh. Hold _60___ seconds. Repeat with other leg.  Copyright  VHI. All rights reserved.

## 2018-03-02 NOTE — Therapy (Signed)
[facility] [address] [place], [place], [address] Phone: [phone]   Fax:  [phone]  Physical Therapy Treatment  Patient Details  Name: [name] [name] MRN: [id number] Date of Birth: [birth year] Referring Provider: Marcene Corning, MD   Encounter Date: 03/02/2018  PT End of Session - 03/02/18 1224    Visit Number  2    Number of Visits  19    Date for PT Re-Evaluation  03/19/18    Authorization Type  [facility]R    Authorization Time Period  02/26/18 to 04/09/18    PT Start Time  0859    PT Stop Time  0942    PT Time Calculation (min)  43 min    Activity Tolerance  Patient tolerated treatment well    Behavior During Therapy  [facility] for tasks assessed/performed       Past Medical History:  Diagnosis Date  . Abnormal Pap smear   . Adult ADHD   . Anxiety   . Arthritis    "knees" (02/10/2018)  . Bipolar disorder (HCC)   . Cholelithiases    04/2015  . Depression 04/15/2013  . Ear infection 03/19/2016  . Essential hypertension, benign   . ETOH abuse    drinks wine 5 day/week  . GAD (generalized anxiety disorder)   . History of blood transfusion 1980s   "related to miscarriage"  . Incompetent cervix   . Obesity   . OSA on CPAP   . Pneumonia    "used to have it alot; nothing in years" (02/10/2018)  . PTSD (post-traumatic stress disorder)   . Thickened endometrium 05/02/2014  . Vaginal Pap smear, abnormal   . Vertigo 03/19/2016    Past Surgical History:  Procedure Laterality Date  . CERVICAL CERCLAGE    . CHOLECYSTECTOMY N/A 05/24/2015   Procedure: LAPAROSCOPIC CHOLECYSTECTOMY WITH INTRAOPERATIVE CHOLANGIOGRAM;  Surgeon: Franky Macho Md, MD;  Location: [facility];  Service: General;  Laterality: N/A;  . DILATION AND CURETTAGE OF UTERUS    . JOINT REPLACEMENT    . KNEE ARTHROSCOPY Right 2015  . TONSILLECTOMY    . 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 TOTAL KNEE ARTHROPLASTY Right 02/10/2018   Procedure: TOTAL KNEE ARTHROPLASTY;   Surgeon: Marcene Corning, MD;  Location: [facility] OR;  Service: Orthopedics;  Laterality: Right;    There were no vitals filed for this visit.  Subjective Assessment - 03/02/18 0854    Subjective  Swelling just hasn't gone down enough yet; just stiff. Sometimes forgets to use her SPC at home. Wants to return to normal playing with her grandkids.    Limitations  Walking;Standing    How long can you sit comfortably?  no issues    How long can you stand comfortably?  no issues    How long can you walk comfortably?  for a while    Patient Stated Goals  get back to normal    Currently in Pain?  No/denies    Pain Location  Knee    Pain Orientation  Right    Pain Descriptors / Indicators  Tightness    Pain Type  Surgical pain    Pain Onset  1 to 4 weeks ago                      [facility] Adult PT Treatment/Exercise - 03/02/18 0001      Knee/Hip Exercises: Stretches   Active Hamstring Stretch  Right;3 reps;60 seconds    Passive Hamstring  Stretch  Right;3 reps;30 seconds    Knee: Self-Stretch to increase Flexion  Right;10 seconds 10 reps    Gastroc Stretch  Right;3 reps;60 seconds    Gastroc Stretch Limitations  --      Knee/Hip Exercises: Standing   Heel Raises  Both;1 set;10 reps      Manual Therapy   Manual Therapy  Edema management    Manual therapy comments  manual complete separate from rest of tx    Edema Management  scar tissue, retrograde          Balance Exercises - 03/02/18 0924      Balance Exercises: Standing   SLS  Eyes open;5 reps 5 trials Rt        PT Education - 03/02/18 1223    Education Details  review initial eval, advanced HEP    Person(s) Educated  Patient    Methods  Explanation;Handout    Comprehension  Verbalized understanding       PT Short Term Goals - 02/26/18 1227      PT SHORT TERM GOAL #1   Title  Pt will be independent with HEP and perform consistently in order to maximize ROM and MMT.    Time  3    Period  Weeks    Status   New    Target Date  03/19/18      PT SHORT TERM GOAL #2   Title  Pt will have improved knee AROM from 5-100deg to decrease pain and maximize gait.     Time  3    Period  Weeks    Status  New      PT SHORT TERM GOAL #3   Title  Pt will have decreased edema at joint line by 5mm in order to decrease stiffness and maximzie ROM.     Time  3    Period  Weeks    Status  New      PT SHORT TERM GOAL #4   Title  Pt will be able to perform 5xSTS in 10 sec or < with no UE and proper mechanics to demo improved functional BLE strength and balance.    Time  3    Period  Weeks    Status  New        PT Long Term Goals - 02/26/18 1230      PT LONG TERM GOAL #1   Title  Pt will have improved R knee AROM from 0-120deg in order to further maximize gait and stair ambulation.    Time  6    Period  Weeks    Status  New    Target Date  04/09/18      PT LONG TERM GOAL #2   Title  Pt will have 1 grade improvement in MMT throughout in order to maximize balance, gait, and promote return to PLOF.    Time  6    Period  Weeks    Status  New      PT LONG TERM GOAL #3   Title  Pt will be able to perform R SLS for at least 15 sec or > to demo improve balance and maximize gait and stair ambulation.    Time  6    Period  Weeks    Status  New      PT LONG TERM GOAL #4   Title  Pt will be able to ambualte at lest 69500ft during the 3MWT with LRAD and gait WFL in  order to maximize community access and participation.     Time  6    Period  Weeks    Status  New            Plan - 03/02/18 1225    Clinical Impression Statement  Patient tolerated session well today; performed exercises welll; reports HEP is going well. Educated patient on body mechanics of gait and to activate gluts and stand more upright to facilitate better gait mechanics. Patient continues to exhibit decreased ROM, strength, gait quality, balance and coordination deficits, therefore, patient would continue to benefit from skilled  physical therapy.     PT Next Visit Plan  advance HEP as able; reviewHEP - HS, calf stretching, heel slides, SAQ; add tandem and SLS on foam compliant surface; manual for edema control    PT Home Exercise Plan  eval: quad set, HS stretch, SLS; SAQ, calf stretch, heel slide, standing hamstring stretch       Patient will benefit from skilled therapeutic intervention in order to improve the following deficits and impairments:     Visit Diagnosis: Stiffness of right knee, not elsewhere classified  Localized edema  Muscle weakness (generalized)  Difficulty in walking, not elsewhere classified     Problem List Patient Active Problem List   Diagnosis Date Noted  . Primary localized osteoarthritis of right knee 02/10/2018  . Primary osteoarthritis of right knee 02/10/2018  . Body mass index 40.0-44.9, adult (HCC) 05/05/2017  . Vertigo 03/19/2016  . Ear infection 03/19/2016  . Elevated LFTs 05/11/2015  . Hyperglycemia 05/11/2015  . Obesity 05/11/2015  . Tobacco abuse 05/11/2015  . Cholelithiases   . Thickened endometrium 05/02/2014  . Depression 04/15/2013  . HTN (hypertension) 04/14/2013  . Anxiety 04/14/2013  . OSA (obstructive sleep apnea) 03/20/2012  . Essential hypertension, benign 11/05/2011  . Nonspecific abnormal electrocardiogram (ECG) (EKG) 11/05/2011  . Pain in limb 11/05/2011    Katina Dung. Hartnett-Rands, MS, PT Per Ladoris Gene [facility] #[id number] 03/02/2018, 12:30 PM  [facility] [address] [place], [place], [address] Phone: [phone]   Fax:  [phone]  Name: [name] [name] MRN: [id number] Date of Birth: January 08, 1961

## 2018-03-04 ENCOUNTER — Ambulatory Visit (HOSPITAL_COMMUNITY): Payer: Commercial Managed Care - PPO | Admitting: Physical Therapy

## 2018-03-04 DIAGNOSIS — R600 Localized edema: Secondary | ICD-10-CM

## 2018-03-04 DIAGNOSIS — M25661 Stiffness of right knee, not elsewhere classified: Secondary | ICD-10-CM

## 2018-03-04 DIAGNOSIS — M6281 Muscle weakness (generalized): Secondary | ICD-10-CM

## 2018-03-04 DIAGNOSIS — R262 Difficulty in walking, not elsewhere classified: Secondary | ICD-10-CM | POA: Diagnosis present

## 2018-03-04 NOTE — Therapy (Signed)
[facility] [address] , [place], [address] Phone: [phone]   Fax:  [phone]  Physical Therapy Treatment  Patient Details  Name: [name] [name] MRN: [id number] Date of Birth: 04/15/1961 Referring Provider: Marcene CorningPeter Dalldorf, MD   Encounter Date: 03/04/2018  PT End of Session - 03/04/18 1620    Visit Number  3    Number of Visits  19    Date for PT Re-Evaluation  03/19/18    Authorization Type  UMR    Authorization Time Period  02/26/18 to 04/09/18    PT Start Time  1300    PT Stop Time  1345    PT Time Calculation (min)  45 min    Activity Tolerance  Patient tolerated treatment well    Behavior During Therapy  [facility] for tasks assessed/performed       Past Medical History:  Diagnosis Date  . Abnormal Pap smear   . Adult ADHD   . Anxiety   . Arthritis    "knees" (02/10/2018)  . Bipolar disorder (HCC)   . Cholelithiases    04/2015  . Depression 04/15/2013  . Ear infection 03/19/2016  . Essential hypertension, benign   . ETOH abuse    drinks wine 5 day/week  . GAD (generalized anxiety disorder)   . History of blood transfusion 1980s   "related to miscarriage"  . Incompetent cervix   . Obesity   . OSA on CPAP   . Pneumonia    "used to have it alot; nothing in years" (02/10/2018)  . PTSD (post-traumatic stress disorder)   . Thickened endometrium 05/02/2014  . Vaginal Pap smear, abnormal   . Vertigo 03/19/2016    Past Surgical History:  Procedure Laterality Date  . CERVICAL CERCLAGE    . CHOLECYSTECTOMY N/A 05/24/2015   Procedure: LAPAROSCOPIC CHOLECYSTECTOMY WITH INTRAOPERATIVE CHOLANGIOGRAM;  Surgeon: Franky MachoMark Jenkins Md, MD;  Location: [facility];  Service: General;  Laterality: N/A;  . DILATION AND CURETTAGE OF UTERUS    . JOINT REPLACEMENT    . KNEE ARTHROSCOPY Right 2015  . TONSILLECTOMY    . 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 TOTAL KNEE ARTHROPLASTY Right 02/10/2018   Procedure: TOTAL KNEE ARTHROPLASTY;   Surgeon: Marcene Corningalldorf, Peter, MD;  Location: [facility];  Service: Orthopedics;  Laterality: Right;    There were no vitals filed for this visit.                   [facility] Adult PT Treatment/Exercise - 03/04/18 0001      Knee/Hip Exercises: Stretches   Active Hamstring Stretch  Right;3 reps;Limitations;30 seconds    Active Hamstring Stretch Limitations  12" box    Knee: Self-Stretch to increase Flexion  Right;10 seconds;Limitations    Knee: Self-Stretch Limitations  10 reps using 12" box    Gastroc Stretch  Right;3 reps;60 seconds;Limitations    Gastroc Stretch Limitations  slant board      Knee/Hip Exercises: Standing   Heel Raises  Both;1 set;10 reps      Knee/Hip Exercises: Supine   Quad Sets  Right;10 reps    Heel Slides  Right;5 sets    Knee Extension  AROM;Limitations    Knee Extension Limitations  8 was 14    Knee Flexion  AROM;Limitations    Knee Flexion Limitations  90 was 90      Manual Therapy   Manual Therapy  Edema management    Manual therapy comments  manual complete separate from  rest of tx    Edema Management  scar tissue, retrograde             PT Education - 03/04/18 1625    Education provided  Yes    Education Details  Educated on benefits of compression garments to reduce swelling/pain.    Person(s) Educated  Patient    Methods  Explanation    Comprehension  Verbalized understanding       PT Short Term Goals - 02/26/18 1227      PT SHORT TERM GOAL #1   Title  Pt will be independent with HEP and perform consistently in order to maximize ROM and MMT.    Time  3    Period  Weeks    Status  New    Target Date  03/19/18      PT SHORT TERM GOAL #2   Title  Pt will have improved knee AROM from 5-100deg to decrease pain and maximize gait.     Time  3    Period  Weeks    Status  New      PT SHORT TERM GOAL #3   Title  Pt will have decreased edema at joint line by 5mm in order to decrease stiffness and maximzie ROM.     Time  3     Period  Weeks    Status  New      PT SHORT TERM GOAL #4   Title  Pt will be able to perform 5xSTS in 10 sec or < with no UE and proper mechanics to demo improved functional BLE strength and balance.    Time  3    Period  Weeks    Status  New        PT Long Term Goals - 02/26/18 1230      PT LONG TERM GOAL #1   Title  Pt will have improved R knee AROM from 0-120deg in order to further maximize gait and stair ambulation.    Time  6    Period  Weeks    Status  New    Target Date  04/09/18      PT LONG TERM GOAL #2   Title  Pt will have 1 grade improvement in MMT throughout in order to maximize balance, gait, and promote return to PLOF.    Time  6    Period  Weeks    Status  New      PT LONG TERM GOAL #3   Title  Pt will be able to perform R SLS for at least 15 sec or > to demo improve balance and maximize gait and stair ambulation.    Time  6    Period  Weeks    Status  New      PT LONG TERM GOAL #4   Title  Pt will be able to ambualte at lest 683ft during the with LRAD and gait WFL in order to maximize community access and participation.     Time  6    Period  Weeks    Status  New            Plan - 03/04/18 1621    Clinical Impression Statement  Pt with increased discomfort and swelling today as compared to last session and reports she believes she has been "over-doing it".  Continued with established therex and spent greatest amount of time on retro massage this session to decrease swelling.  ROM improved for  extension at 8 from neutral (was 14) but staying constant at 90 degrees for flexion.  pt encouraged to elevate/ice more at home.  Pt verbalized understanding.     PT Next Visit Plan  Continue with manual for edema control and improve knee mobility.  Next session begin standing hamstring curls, SLS, tandem stance and SAQ.  Re-visit need for thigh high garment and give information on Elastic therapy if needed.      PT Home Exercise Plan  eval: quad set, HS  stretch, SLS; SAQ, calf stretch, heel slide, standing hamstring stretch       Patient will benefit from skilled therapeutic intervention in order to improve the following deficits and impairments:     Visit Diagnosis: Stiffness of right knee, not elsewhere classified  Localized edema  Muscle weakness (generalized)  Difficulty in walking, not elsewhere classified     Problem List Patient Active Problem List   Diagnosis Date Noted  . Primary localized osteoarthritis of right knee 02/10/2018  . Primary osteoarthritis of right knee 02/10/2018  . Body mass index 40.0-44.9, adult (HCC) 05/05/2017  . Vertigo 03/19/2016  . Ear infection 03/19/2016  . Elevated LFTs 05/11/2015  . Hyperglycemia 05/11/2015  . Obesity 05/11/2015  . Tobacco abuse 05/11/2015  . Cholelithiases   . Thickened endometrium 05/02/2014  . Depression 04/15/2013  . HTN (hypertension) 04/14/2013  . Anxiety 04/14/2013  . OSA (obstructive sleep apnea) 03/20/2012  . Essential hypertension, benign 11/05/2011  . Nonspecific abnormal electrocardiogram (ECG) (EKG) 11/05/2011  . Pain in limb 11/05/2011   Lurena Nida, PTA/CLT [phone]  Lurena Nida 03/04/2018, 4:25 PM  [facility] [address] [place], [place], [address] Phone: [phone]   Fax:  [phone]  Name: [name] [name] MRN: [id number] Date of Birth: 02-25-1961

## 2018-03-09 ENCOUNTER — Encounter (HOSPITAL_COMMUNITY): Payer: Commercial Managed Care - PPO

## 2018-03-09 ENCOUNTER — Telehealth (HOSPITAL_COMMUNITY): Payer: Self-pay

## 2018-03-09 NOTE — Telephone Encounter (Signed)
03/09/18  pt left a message to cx says she is not feeling that well today

## 2018-03-11 ENCOUNTER — Ambulatory Visit (HOSPITAL_COMMUNITY): Payer: Commercial Managed Care - PPO

## 2018-03-11 ENCOUNTER — Encounter (HOSPITAL_COMMUNITY): Payer: Self-pay

## 2018-03-11 DIAGNOSIS — M25661 Stiffness of right knee, not elsewhere classified: Secondary | ICD-10-CM

## 2018-03-11 DIAGNOSIS — M6281 Muscle weakness (generalized): Secondary | ICD-10-CM

## 2018-03-11 DIAGNOSIS — R600 Localized edema: Secondary | ICD-10-CM

## 2018-03-11 DIAGNOSIS — R262 Difficulty in walking, not elsewhere classified: Secondary | ICD-10-CM

## 2018-03-11 NOTE — Patient Instructions (Addendum)
Knee Extension Mobilization: Towel Prop    With rolled towel under right ankle, place ____ pound weight across knee. Hold 3-5 minutes. Repeat 1-2 times per set. Do ____ sets per session. Do ____ sessions per day.  http://orth.exer.us/720   Copyright  VHI. All rights reserved.  Knee Extension: Terminal - Standing (Single Leg)    Face anchor in shoulder width stance, band around knee. Allow tension of band to slightly bend knee. Pull leg back, straightening knee. Repeat 10-15 times per set. Do 1-2 sets per session.  Anchor Height: Knee  http://tub.exer.us/36   Copyright  VHI. All rights reserved.

## 2018-03-11 NOTE — Therapy (Signed)
[facility] [address] [place], [place], [address] Phone: [phone]   Fax:  [phone]  Physical Therapy Treatment  Patient Details  Name: [name] [name] MRN: [id number] Date of Birth: 1961-11-22 Referring Provider: Marcene CorningPeter Dalldorf, MD   Encounter Date: 03/11/2018  PT End of Session - 03/11/18 0902    Visit Number  4    Number of Visits  19    Date for PT Re-Evaluation  04/09/18 minireasess 03/19/18    Authorization Type  UMR    Authorization Time Period  02/26/18 to 04/09/18    PT Start Time  0855    PT Stop Time  0945    PT Time Calculation (min)  50 min    Activity Tolerance  Patient tolerated treatment well    Behavior During Therapy  [facility] for tasks assessed/performed       Past Medical History:  Diagnosis Date  . Abnormal Pap smear   . Adult ADHD   . Anxiety   . Arthritis    "knees" (02/10/2018)  . Bipolar disorder (HCC)   . Cholelithiases    04/2015  . Depression 04/15/2013  . Ear infection 03/19/2016  . Essential hypertension, benign   . ETOH abuse    drinks wine 5 day/week  . GAD (generalized anxiety disorder)   . History of blood transfusion 1980s   "related to miscarriage"  . Incompetent cervix   . Obesity   . OSA on CPAP   . Pneumonia    "used to have it alot; nothing in years" (02/10/2018)  . PTSD (post-traumatic stress disorder)   . Thickened endometrium 05/02/2014  . Vaginal Pap smear, abnormal   . Vertigo 03/19/2016    Past Surgical History:  Procedure Laterality Date  . CERVICAL CERCLAGE    . CHOLECYSTECTOMY N/A 05/24/2015   Procedure: LAPAROSCOPIC CHOLECYSTECTOMY WITH INTRAOPERATIVE CHOLANGIOGRAM;  Surgeon: Franky MachoMark Jenkins Md, MD;  Location: [facility];  Service: General;  Laterality: N/A;  . DILATION AND CURETTAGE OF UTERUS    . JOINT REPLACEMENT    . KNEE ARTHROSCOPY Right 2015  . TONSILLECTOMY    . 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 TOTAL KNEE ARTHROPLASTY Right 02/10/2018   Procedure: TOTAL KNEE  ARTHROPLASTY;  Surgeon: Marcene Corningalldorf, Peter, MD;  Location: [facility] OR;  Service: Orthopedics;  Laterality: Right;    There were no vitals filed for this visit.  Subjective Assessment - 03/11/18 0858    Subjective  Rough night, had to put dog down last night due to cancer.  Knee feels stiff today, no reports of pain.  Pt arrived without cane, stated its been a week since used.  Knee continues to swell.      Patient Stated Goals  get back to normal    Currently in Pain?  No/denies         [facility] - 03/11/18 0001      Assessment   Medical Diagnosis  R TKA    Referring Provider  Marcene CorningPeter Dalldorf, MD    Onset Date/Surgical Date  02/10/18    Next MD Visit  April 2019    Prior Therapy  [facility], d/c 02/25/18                  [facility] Treatment/Exercise - 03/11/18 0001      Knee/Hip Exercises: Stretches   Active Hamstring Stretch  Right;3 reps;Limitations;30 seconds    Active Hamstring Stretch Limitations  supine    Knee: Self-Stretch to increase  Flexion  Right;10 seconds;Limitations    Knee: Self-Stretch Limitations  10 reps using 12" box    Gastroc Stretch  Right;3 reps;Limitations;30 seconds    Gastroc Stretch Limitations  slant board      Knee/Hip Exercises: Standing   Heel Raises  15 reps    Knee Flexion  10 reps    Terminal Knee Extension  Right;10 reps;Theraband    Theraband Level (Terminal Knee Extension)  Level 2 (Red)    Functional Squat  10 reps    SLS  Lt 26", Rt 16" max of 3      Knee/Hip Exercises: Supine   Quad Sets  Right;10 reps    Short Arc Quad Sets  15 reps    Heel Slides  10 reps    Knee Extension  AROM;Limitations    Knee Extension Limitations  6 was 8    Knee Flexion  AROM;Limitations    Knee Flexion Limitations  106 was 90      Manual Therapy   Manual Therapy  Edema management;Myofascial release    Manual therapy comments  manual complete separate from rest of tx    Edema Management  retrograde massage with LE elevated    Myofascial  Release  scar tissue               PT Short Term Goals - 02/26/18 1227      PT SHORT TERM GOAL #1   Title  Pt will be independent with HEP and perform consistently in order to maximize ROM and MMT.    Time  3    Period  Weeks    Status  New    Target Date  03/19/18      PT SHORT TERM GOAL #2   Title  Pt will have improved knee AROM from 5-100deg to decrease pain and maximize gait.     Time  3    Period  Weeks    Status  New      PT SHORT TERM GOAL #3   Title  Pt will have decreased edema at joint line by 5mm in order to decrease stiffness and maximzie ROM.     Time  3    Period  Weeks    Status  New      PT SHORT TERM GOAL #4   Title  Pt will be able to perform 5xSTS in 10 sec or < with no UE and proper mechanics to demo improved functional BLE strength and balance.    Time  3    Period  Weeks    Status  New        PT Long Term Goals - 02/26/18 1230      PT LONG TERM GOAL #1   Title  Pt will have improved R knee AROM from 0-120deg in order to further maximize gait and stair ambulation.    Time  6    Period  Weeks    Status  New    Target Date  04/09/18      PT LONG TERM GOAL #2   Title  Pt will have 1 grade improvement in MMT throughout in order to maximize balance, gait, and promote return to PLOF.    Time  6    Period  Weeks    Status  New      PT LONG TERM GOAL #3   Title  Pt will be able to perform R SLS for at least 15 sec or > to demo  improve balance and maximize gait and stair ambulation.    Time  6    Period  Weeks    Status  New      PT LONG TERM GOAL #4   Title  Pt will be able to ambualte at lest 630ft during the with LRAD and gait WFL in order to maximize community access and participation.     Time  6    Period  Weeks    Status  New            Plan - 03/11/18 0929    Clinical Impression Statement  Pt continues to exhibit edema present proximal knee, began session with manual retro massage for edema control and myofascial  release techniques to address scar tissue adhesions.  Discussed benefits of compression hose and measurements taken for purchase with paperwork given.   Added standing mobilty and balance to POC.  Pt progressing well with improved AROM to 6-106 degrees (was 8-90 degress last session.)  No reports of increased pain at EOS.      Rehab Potential  Good    PT Frequency  3x / week    PT Duration  6 weeks    PT Treatment/Interventions  ADLs/Self Care Home Management;Cryotherapy;Electrical Stimulation;Moist Heat;Ultrasound;DME Instruction;Gait training;Stair training;Functional mobility training;Therapeutic activities;Therapeutic exercise;Balance training;Neuromuscular re-education;Patient/family education;Manual techniques;Scar mobilization;Passive range of motion;Dry needling;Energy conservation;Taping;Vestibular    PT Next Visit Plan  Continue with manual for edema control and improve knee mobility.  Next session begin tandem stance.  F/U with questions/purchase of compression hose.    PT Home Exercise Plan  eval: quad set, HS stretch, SLS; SAQ, calf stretch, heel slide, standing hamstring stretch; TKE       Patient will benefit from skilled therapeutic intervention in order to improve the following deficits and impairments:  Abnormal gait, Decreased activity tolerance, Decreased balance, Decreased endurance, Decreased mobility, Decreased range of motion, Decreased strength, Difficulty walking, Hypomobility, Increased edema, Increased fascial restricitons, Increased muscle spasms, Impaired flexibility, Improper body mechanics, Pain  Visit Diagnosis: Stiffness of right knee, not elsewhere classified  Localized edema  Muscle weakness (generalized)  Difficulty in walking, not elsewhere classified     Problem List Patient Active Problem List   Diagnosis Date Noted  . Primary localized osteoarthritis of right knee 02/10/2018  . Primary osteoarthritis of right knee 02/10/2018  . Body mass index  40.0-44.9, adult (HCC) 05/05/2017  . Vertigo 03/19/2016  . Ear infection 03/19/2016  . Elevated LFTs 05/11/2015  . Hyperglycemia 05/11/2015  . Obesity 05/11/2015  . Tobacco abuse 05/11/2015  . Cholelithiases   . Thickened endometrium 05/02/2014  . Depression 04/15/2013  . HTN (hypertension) 04/14/2013  . Anxiety 04/14/2013  . OSA (obstructive sleep apnea) 03/20/2012  . Essential hypertension, benign 11/05/2011  . Nonspecific abnormal electrocardiogram (ECG) (EKG) 11/05/2011  . Pain in limb 11/05/2011   Becky Sax, LPTA; CBIS [phone]  Juel Burrow 03/11/2018, 4:17 PM  [facility] [address] [place], [place], [address] Phone: [phone]   Fax:  [phone]  Name: [name] [name] MRN: [id number] Date of Birth: 08/25/1961

## 2018-03-13 ENCOUNTER — Ambulatory Visit (HOSPITAL_COMMUNITY): Payer: Commercial Managed Care - PPO

## 2018-03-13 ENCOUNTER — Encounter (HOSPITAL_COMMUNITY): Payer: Self-pay

## 2018-03-13 DIAGNOSIS — M25661 Stiffness of right knee, not elsewhere classified: Secondary | ICD-10-CM | POA: Diagnosis not present

## 2018-03-13 DIAGNOSIS — R600 Localized edema: Secondary | ICD-10-CM

## 2018-03-13 DIAGNOSIS — M6281 Muscle weakness (generalized): Secondary | ICD-10-CM

## 2018-03-13 DIAGNOSIS — R262 Difficulty in walking, not elsewhere classified: Secondary | ICD-10-CM

## 2018-03-13 NOTE — Therapy (Signed)
[facility] [address] , [place], [address] Phone: [phone]   Fax:  [phone]  Physical Therapy Treatment  Patient Details  Name: [name] [name] J Wyatt MRN: [id number] Date of Birth: January 23, 1961 Referring Provider: Marcene CorningPeter Dalldorf, MD   Encounter Date: 03/13/2018  PT End of Session - 03/13/18 0915    Visit Number  5    Number of Visits  19    Date for PT Re-Evaluation  04/09/18 minireassess 03/19/18    Authorization Type  UMR    Authorization Time Period  02/26/18 to 04/09/18    PT Start Time  0902    PT Stop Time  0945    PT Time Calculation (min)  43 min    Activity Tolerance  Patient tolerated treatment well    Behavior During Therapy  [facility] for tasks assessed/performed       Past Medical History:  Diagnosis Date  . Abnormal Pap smear   . Adult ADHD   . Anxiety   . Arthritis    "knees" (02/10/2018)  . Bipolar disorder (HCC)   . Cholelithiases    04/2015  . Depression 04/15/2013  . Ear infection 03/19/2016  . Essential hypertension, benign   . ETOH abuse    drinks wine 5 day/week  . GAD (generalized anxiety disorder)   . History of blood transfusion 1980s   "related to miscarriage"  . Incompetent cervix   . Obesity   . OSA on CPAP   . Pneumonia    "used to have it alot; nothing in years" (02/10/2018)  . PTSD (post-traumatic stress disorder)   . Thickened endometrium 05/02/2014  . Vaginal Pap smear, abnormal   . Vertigo 03/19/2016    Past Surgical History:  Procedure Laterality Date  . CERVICAL CERCLAGE    . CHOLECYSTECTOMY N/A 05/24/2015   Procedure: LAPAROSCOPIC CHOLECYSTECTOMY WITH INTRAOPERATIVE CHOLANGIOGRAM;  Surgeon: Franky MachoMark Jenkins Md, MD;  Location: [facility];  Service: General;  Laterality: N/A;  . DILATION AND CURETTAGE OF UTERUS    . JOINT REPLACEMENT    . KNEE ARTHROSCOPY Right 2015  . TONSILLECTOMY    . 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 TOTAL KNEE ARTHROPLASTY Right 02/10/2018   Procedure: TOTAL  KNEE ARTHROPLASTY;  Surgeon: Marcene Corningalldorf, Peter, MD;  Location: [facility];  Service: Orthopedics;  Laterality: Right;    There were no vitals filed for this visit.  Subjective Assessment - 03/13/18 0903    Subjective  Pt stated she is feeling good today, no reports of pain today.  Knee continues to swell, going to [place] later today to purchase compression hose.      Patient Stated Goals  get back to normal    Currently in Pain?  No/denies                      [facility] - 03/13/18 0001      Knee/Hip Exercises: Stretches   Active Hamstring Stretch  Right;3 reps;Limitations;30 seconds    Active Hamstring Stretch Limitations  12" step    Knee: Self-Stretch to increase Flexion  Right;10 seconds;Limitations    Knee: Self-Stretch Limitations  10 reps using 12" box    Gastroc Stretch  Right;3 reps;Limitations;30 seconds    Gastroc Stretch Limitations  slant board      Knee/Hip Exercises: Standing   Terminal Knee Extension  Right;15 reps    Theraband Level (Terminal Knee Extension)  Level 2 (Red)    Lateral Step Up  Right;10 reps;Hand Hold: 1;Step Height: 4" cueing for mechanics and to reduce hip hiking    Forward Step Up  Right;10 reps;Hand Hold: 1;Step Height: 4"    Functional Squat  15 reps    SLS  Lt 44", RT 54"    Other Standing Knee Exercises  tandem stance on foam 2x 30"      Knee/Hip Exercises: Supine   Quad Sets  15 reps    Short Arc Quad Sets  15 reps    Heel Slides  10 reps    Bridges  15 reps    Knee Extension  AROM;Limitations;PROM;3 sets PROM 3x 20" holds as tolerated    Knee Extension Limitations  6    Knee Flexion  AROM;Limitations    Knee Flexion Limitations  115 was 106      Manual Therapy   Manual Therapy  Edema management;Myofascial release    Manual therapy comments  manual complete separate from rest of tx    Edema Management  retrograde massage with LE elevated    Myofascial Release  scar tissue               PT  Short Term Goals - 02/26/18 1227      PT SHORT TERM GOAL #1   Title  Pt will be independent with HEP and perform consistently in order to maximize ROM and MMT.    Time  3    Period  Weeks    Status  New    Target Date  03/19/18      PT SHORT TERM GOAL #2   Title  Pt will have improved knee AROM from 5-100deg to decrease pain and maximize gait.     Time  3    Period  Weeks    Status  New      PT SHORT TERM GOAL #3   Title  Pt will have decreased edema at joint line by 5mm in order to decrease stiffness and maximzie ROM.     Time  3    Period  Weeks    Status  New      PT SHORT TERM GOAL #4   Title  Pt will be able to perform 5xSTS in 10 sec or < with no UE and proper mechanics to demo improved functional BLE strength and balance.    Time  3    Period  Weeks    Status  New        PT Long Term Goals - 02/26/18 1230      PT LONG TERM GOAL #1   Title  Pt will have improved R knee AROM from 0-120deg in order to further maximize gait and stair ambulation.    Time  6    Period  Weeks    Status  New    Target Date  04/09/18      PT LONG TERM GOAL #2   Title  Pt will have 1 grade improvement in MMT throughout in order to maximize balance, gait, and promote return to PLOF.    Time  6    Period  Weeks    Status  New      PT LONG TERM GOAL #3   Title  Pt will be able to perform R SLS for at least 15 sec or > to demo improve balance and maximize gait and stair ambulation.    Time  6    Period  Weeks    Status  New  PT LONG TERM GOAL #4   Title  Pt will be able to ambualte at lest 651ft during the with LRAD and gait WFL in order to maximize community access and participation.     Time  6    Period  Weeks    Status  New            Plan - 03/13/18 0956    Clinical Impression Statement  Pt continues to have edema present proximal knee.  Pt stated plans to purchase compression hose later today.  Began session with manual technqiues to address edema and  myofascial restricitons on scar tissue more proximal on incision.  Pt progressing well with improved AROM 6-115 degrees (was 6-106 last session).  Improved SLS as well.  Added tandem stance and lateral/forward step ups for quad strengthening and progressed balance.  No reports of pain through session.      Rehab Potential  Good    PT Frequency  3x / week    PT Duration  6 weeks    PT Treatment/Interventions  ADLs/Self Care Home Management;Cryotherapy;Electrical Stimulation;Moist Heat;Ultrasound;DME Instruction;Gait training;Stair training;Functional mobility training;Therapeutic activities;Therapeutic exercise;Balance training;Neuromuscular re-education;Patient/family education;Manual techniques;Scar mobilization;Passive range of motion;Dry needling;Energy conservation;Taping;Vestibular    PT Next Visit Plan  Begin prone knee hang with manual for extension and rockerboard to improve weight distribution with gait.  Continue wiht manual for edema control to improve knee mobility.  ROM based exercises and progress functional strengthening as appropriate.    PT Home Exercise Plan  eval: quad set, HS stretch, SLS; SAQ, calf stretch, heel slide, standing hamstring stretch; TKE       Patient will benefit from skilled therapeutic intervention in order to improve the following deficits and impairments:  Abnormal gait, Decreased activity tolerance, Decreased balance, Decreased endurance, Decreased mobility, Decreased range of motion, Decreased strength, Difficulty walking, Hypomobility, Increased edema, Increased fascial restricitons, Increased muscle spasms, Impaired flexibility, Improper body mechanics, Pain  Visit Diagnosis: Stiffness of right knee, not elsewhere classified  Localized edema  Muscle weakness (generalized)  Difficulty in walking, not elsewhere classified     Problem List Patient Active Problem List   Diagnosis Date Noted  . Primary localized osteoarthritis of right knee 02/10/2018   . Primary osteoarthritis of right knee 02/10/2018  . Body mass index 40.0-44.9, adult (HCC) 05/05/2017  . Vertigo 03/19/2016  . Ear infection 03/19/2016  . Elevated LFTs 05/11/2015  . Hyperglycemia 05/11/2015  . Obesity 05/11/2015  . Tobacco abuse 05/11/2015  . Cholelithiases   . Thickened endometrium 05/02/2014  . Depression 04/15/2013  . HTN (hypertension) 04/14/2013  . Anxiety 04/14/2013  . OSA (obstructive sleep apnea) 03/20/2012  . Essential hypertension, benign 11/05/2011  . Nonspecific abnormal electrocardiogram (ECG) (EKG) 11/05/2011  . Pain in limb 11/05/2011   Becky Sax, LPTA; CBIS [phone]  Juel Burrow 03/13/2018, 10:04 AM  [facility] [address] [place], [place], [address] Phone: [phone]   Fax:  [phone]  Name: SHANESHA BEDNARZ MRN: [id number] Date of Birth: 1961-08-12

## 2018-03-16 ENCOUNTER — Ambulatory Visit (HOSPITAL_COMMUNITY): Payer: Commercial Managed Care - PPO | Admitting: Physical Therapy

## 2018-03-16 DIAGNOSIS — M6281 Muscle weakness (generalized): Secondary | ICD-10-CM

## 2018-03-16 DIAGNOSIS — M25661 Stiffness of right knee, not elsewhere classified: Secondary | ICD-10-CM

## 2018-03-16 DIAGNOSIS — R262 Difficulty in walking, not elsewhere classified: Secondary | ICD-10-CM

## 2018-03-16 DIAGNOSIS — R600 Localized edema: Secondary | ICD-10-CM

## 2018-03-16 NOTE — Therapy (Signed)
[facility] [address] [place], [place], [address] Phone: [phone]   Fax:  [phone]  Physical Therapy Treatment  Patient Details  Name: [name] [name] MRN: [id number] Date of Birth: April 10, 1961 Referring Provider: Marcene CorningPeter Dalldorf, MD   Encounter Date: 03/16/2018  PT End of Session - 03/16/18 1048    Visit Number  6    Number of Visits  19    Date for PT Re-Evaluation  04/09/18 minireassess 03/19/18    Authorization Type  UMR    Authorization Time Period  02/26/18 to 04/09/18    PT Start Time  0948    PT Stop Time  1030    PT Time Calculation (min)  42 min    Activity Tolerance  Patient tolerated treatment well    Behavior During Therapy  [facility] for tasks assessed/performed       Past Medical History:  Diagnosis Date  . Abnormal Pap smear   . Adult ADHD   . Anxiety   . Arthritis    "knees" (02/10/2018)  . Bipolar disorder (HCC)   . Cholelithiases    04/2015  . Depression 04/15/2013  . Ear infection 03/19/2016  . Essential hypertension, benign   . ETOH abuse    drinks wine 5 day/week  . GAD (generalized anxiety disorder)   . History of blood transfusion 1980s   "related to miscarriage"  . Incompetent cervix   . Obesity   . OSA on CPAP   . Pneumonia    "used to have it alot; nothing in years" (02/10/2018)  . PTSD (post-traumatic stress disorder)   . Thickened endometrium 05/02/2014  . Vaginal Pap smear, abnormal   . Vertigo 03/19/2016    Past Surgical History:  Procedure Laterality Date  . CERVICAL CERCLAGE    . CHOLECYSTECTOMY N/A 05/24/2015   Procedure: LAPAROSCOPIC CHOLECYSTECTOMY WITH INTRAOPERATIVE CHOLANGIOGRAM;  Surgeon: Franky MachoMark Jenkins Md, MD;  Location: [facility];  Service: General;  Laterality: N/A;  . DILATION AND CURETTAGE OF UTERUS    . JOINT REPLACEMENT    . KNEE ARTHROSCOPY Right 2015  . TONSILLECTOMY    . 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 TOTAL KNEE ARTHROPLASTY Right 02/10/2018   Procedure: TOTAL  KNEE ARTHROPLASTY;  Surgeon: Marcene Corningalldorf, Peter, MD;  Location: [facility];  Service: Orthopedics;  Laterality: Right;    There were no vitals filed for this visit.  Subjective Assessment - 03/16/18 0951    Subjective  Pt states she is doing well today but had a rough weekend following taking a bath Friday night and had difficulty getting out.  States she thinks she injured it because she had to get on her knees to get out.  STates she is not going to try to get back in her tub at this time.  STates it was more swollen and got up to 3/10 over the weekend.  No pain currently just stiffness.      Currently in Pain?  No/denies                      [facility] - 03/16/18 0001      Knee/Hip Exercises: Stretches   Active Hamstring Stretch  Right;3 reps;Limitations;30 seconds    Active Hamstring Stretch Limitations  12" step    Knee: Self-Stretch to increase Flexion  Right;10 seconds;Limitations    Knee: Self-Stretch Limitations  10 reps using 12" box    Gastroc Stretch  Right;3 reps;Limitations;30 seconds  Gastroc Stretch Limitations  slant board      Knee/Hip Exercises: Standing   Forward Lunges  10 reps;Limitations    Forward Lunges Limitations  onto 4" step without UE assist    Terminal Knee Extension  Right;10 reps    Theraband Level (Terminal Knee Extension)  Level 2 (Red) 5 sec holds    Lateral Step Up  Right;Hand Hold: 1;Step Height: 4";15 reps    Forward Step Up  Right;Hand Hold: 1;Step Height: 4";15 reps    Step Down  Right;10 reps;Hand Hold: 1;Step Height: 4"    Functional Squat  15 reps    Rocker Board  2 minutes Rt/Lt     Other Standing Knee Exercises  tandem stance on foam 2x 30"      Knee/Hip Exercises: Supine   Quad Sets  15 reps    Short Arc Quad Sets  15 reps    Heel Slides  10 reps    Knee Extension  AROM;Limitations;PROM;3 sets    Knee Extension Limitations  8    Knee Flexion  AROM;Limitations    Knee Flexion Limitations  105       Manual Therapy   Manual Therapy  Edema management;Myofascial release    Manual therapy comments  manual complete separate from rest of tx    Edema Management  retrograde massage with LE elevated    Myofascial Release  scar tissue               PT Short Term Goals - 02/26/18 1227      PT SHORT TERM GOAL #1   Title  Pt will be independent with HEP and perform consistently in order to maximize ROM and MMT.    Time  3    Period  Weeks    Status  New    Target Date  03/19/18      PT SHORT TERM GOAL #2   Title  Pt will have improved knee AROM from 5-100deg to decrease pain and maximize gait.     Time  3    Period  Weeks    Status  New      PT SHORT TERM GOAL #3   Title  Pt will have decreased edema at joint line by 5mm in order to decrease stiffness and maximzie ROM.     Time  3    Period  Weeks    Status  New      PT SHORT TERM GOAL #4   Title  Pt will be able to perform 5xSTS in 10 sec or < with no UE and proper mechanics to demo improved functional BLE strength and balance.    Time  3    Period  Weeks    Status  New        PT Long Term Goals - 02/26/18 1230      PT LONG TERM GOAL #1   Title  Pt will have improved R knee AROM from 0-120deg in order to further maximize gait and stair ambulation.    Time  6    Period  Weeks    Status  New    Target Date  04/09/18      PT LONG TERM GOAL #2   Title  Pt will have 1 grade improvement in MMT throughout in order to maximize balance, gait, and promote return to PLOF.    Time  6    Period  Weeks    Status  New  PT LONG TERM GOAL #3   Title  Pt will be able to perform R SLS for at least 15 sec or > to demo improve balance and maximize gait and stair ambulation.    Time  6    Period  Weeks    Status  New      PT LONG TERM GOAL #4   Title  Pt will be able to ambualte at lest 654ft during the with LRAD and gait WFL in order to maximize community access and participation.     Time  6    Period  Weeks     Status  New            Plan - 03/16/18 1048    Clinical Impression Statement  Increased edema and tightness as compared to last session due to hurting it getting out of the tub.  Pt without pain today, however tightness.  Unablet to achieve ROM as well as last session (6-115) with AROM at 8-105.  Pt reportedly disgusted with ROM and is to stay out of tub at this time.  Encouarged to continue working on ROM at home.  Added lunges and rockerboard this session.  Pt required manual assist from therapist to decrease substituiton of hips with lateral movements.  Manual completed to decrease edema and tight fascial restrictions.    Rehab Potential  Good    PT Frequency  3x / week    PT Duration  6 weeks    PT Treatment/Interventions  ADLs/Self Care Home Management;Cryotherapy;Electrical Stimulation;Moist Heat;Ultrasound;DME Instruction;Gait training;Stair training;Functional mobility training;Therapeutic activities;Therapeutic exercise;Balance training;Neuromuscular re-education;Patient/family education;Manual techniques;Scar mobilization;Passive range of motion;Dry needling;Energy conservation;Taping;Vestibular    PT Next Visit Plan  Continue with manual for edema control and to reduce adhesions for increased knee mobility.  ROM based exercises and progress functional strengthening as appropriate.  Begin prone knee hang next session to increase extension.    PT Home Exercise Plan  eval: quad set, HS stretch, SLS; SAQ, calf stretch, heel slide, standing hamstring stretch; TKE       Patient will benefit from skilled therapeutic intervention in order to improve the following deficits and impairments:  Abnormal gait, Decreased activity tolerance, Decreased balance, Decreased endurance, Decreased mobility, Decreased range of motion, Decreased strength, Difficulty walking, Hypomobility, Increased edema, Increased fascial restricitons, Increased muscle spasms, Impaired flexibility, Improper body mechanics,  Pain  Visit Diagnosis: Stiffness of right knee, not elsewhere classified  Localized edema  Muscle weakness (generalized)  Difficulty in walking, not elsewhere classified     Problem List Patient Active Problem List   Diagnosis Date Noted  . Primary localized osteoarthritis of right knee 02/10/2018  . Primary osteoarthritis of right knee 02/10/2018  . Body mass index 40.0-44.9, adult (HCC) 05/05/2017  . Vertigo 03/19/2016  . Ear infection 03/19/2016  . Elevated LFTs 05/11/2015  . Hyperglycemia 05/11/2015  . Obesity 05/11/2015  . Tobacco abuse 05/11/2015  . Cholelithiases   . Thickened endometrium 05/02/2014  . Depression 04/15/2013  . HTN (hypertension) 04/14/2013  . Anxiety 04/14/2013  . OSA (obstructive sleep apnea) 03/20/2012  . Essential hypertension, benign 11/05/2011  . Nonspecific abnormal electrocardiogram (ECG) (EKG) 11/05/2011  . Pain in limb 11/05/2011   Lurena Nida, PTA/CLT [phone]  Lurena Nida 03/16/2018, 10:57 AM  [facility] [address] [place], [place], [address] Phone: [phone]   Fax:  [phone]  Name: RAYLINN KOSAR MRN: [id number] Date of Birth: [birth year]

## 2018-03-18 ENCOUNTER — Ambulatory Visit (HOSPITAL_COMMUNITY): Payer: Commercial Managed Care - PPO

## 2018-03-18 ENCOUNTER — Encounter (HOSPITAL_COMMUNITY): Payer: Self-pay

## 2018-03-18 DIAGNOSIS — M25661 Stiffness of right knee, not elsewhere classified: Secondary | ICD-10-CM

## 2018-03-18 DIAGNOSIS — R262 Difficulty in walking, not elsewhere classified: Secondary | ICD-10-CM

## 2018-03-18 DIAGNOSIS — M6281 Muscle weakness (generalized): Secondary | ICD-10-CM

## 2018-03-18 DIAGNOSIS — R600 Localized edema: Secondary | ICD-10-CM

## 2018-03-18 NOTE — Therapy (Signed)
[place], [place], [address] Phone: [phone]   Fax:  [phone]  Physical Therapy Treatment/Reassessment  Patient Details  Name: [name] [name] MRN: [id number] Date of Birth: 29-Oct-1961 Referring Provider: Melrose Nakayama, MD   Encounter Date: 03/18/2018  PT End of Session - 03/18/18 0859    Visit Number  7    Number of Visits  19    Date for PT Re-Evaluation  04/09/18    Authorization Type  [facility]R    Authorization Time Period  02/26/18 to 04/09/18    PT Start Time  0856    PT Stop Time  0940    PT Time Calculation (min)  44 min    Activity Tolerance  Patient tolerated treatment well    Behavior During Therapy  [facility] for tasks assessed/performed       Past Medical History:  Diagnosis Date  . Abnormal Pap smear   . Adult ADHD   . Anxiety   . Arthritis    "knees" (02/10/2018)  . Bipolar disorder ([place])   . Cholelithiases    04/2015  . Depression 04/15/2013  . Ear infection 03/19/2016  . Essential hypertension, benign   . ETOH abuse    drinks wine 5 day/week  . GAD (generalized anxiety disorder)   . History of blood transfusion 1980s   "related to miscarriage"  . Incompetent cervix   . Obesity   . OSA on CPAP   . Pneumonia    "used to have it alot; nothing in years" (02/10/2018)  . PTSD (post-traumatic stress disorder)   . Thickened endometrium 05/02/2014  . Vaginal Pap smear, abnormal   . Vertigo 03/19/2016    Past Surgical History:  Procedure Laterality Date  . CERVICAL CERCLAGE    . CHOLECYSTECTOMY N/A 05/24/2015   Procedure: LAPAROSCOPIC CHOLECYSTECTOMY WITH INTRAOPERATIVE CHOLANGIOGRAM;  Surgeon: Aviva Signs Md, MD;  Location: [facility];  Service: General;  Laterality: N/A;  . DILATION AND CURETTAGE OF UTERUS    . JOINT REPLACEMENT    . KNEE ARTHROSCOPY Right 2015  . TONSILLECTOMY    . 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 TOTAL KNEE ARTHROPLASTY Right 02/10/2018   Procedure: TOTAL KNEE  ARTHROPLASTY;  Surgeon: Melrose Nakayama, MD;  Location: [facility] [place];  Service: Orthopedics;  Laterality: Right;    There were no vitals filed for this visit.  Subjective Assessment - 03/18/18 0859    Subjective  Pt denies any pain this morning, just reports being sore. She states that she's doing pretty well this morning.     Currently in Pain?  No/denies         [facility] PT Assessment - 03/18/18 0001      Assessment   Medical Diagnosis  R TKA    Referring Provider  Melrose Nakayama, MD    Onset Date/Surgical Date  02/10/18    Next MD Visit  April 2019    Prior Therapy  [facility], d/c 02/25/18      Observation/Other Assessments-Edema    Edema  Circumferential      Circumferential Edema   Circumferential - Right  46.5cm  was 49cm joint line      ROM / Strength   AROM / PROM / Strength  AROM;Strength      AROM   AROM Assessment Site  Knee    Right/Left Knee  Right    Right Knee Extension  8 was 14    Right Knee Flexion  110 was 90  Strength   Right Hip Flexion  5/5 was 4+    Right Hip Extension  4+/5 was 4    Right Hip ABduction  4+/5 was 4+    Left Hip Extension  4/5 was 4    Left Hip ABduction  4+/5 was 4    Right Knee Flexion  4+/5 was 4-    Right Knee Extension  4/5 was 4-    Left Knee Flexion  5/5 was 4+      Ambulation/Gait   Ambulation Distance (Feet)  736 Feet was 455f with SPC    Assistive device  None    Gait Pattern  Step-through pattern;Decreased stance time - right;Decreased step length - left;Antalgic;Trendelenburg;Lateral trunk lean to right    Gait Comments  no knee pain      Balance   Balance Assessed  Yes      Static Standing Balance   Static Standing - Balance Support  No upper extremity supported    Static Standing Balance -  Activities   Single Leg Stance - Right Leg    Static Standing - Comment/# of Minutes  R: 27 sec      Standardized Balance Assessment   Standardized Balance Assessment  Five Times Sit to Stand    Five times sit to stand comments    9.76sec, chair, no UE            [facility] Adult PT Treatment/Exercise - 03/18/18 0001      Knee/Hip Exercises: Stretches   Active Hamstring Stretch  Right;3 reps;Limitations;30 seconds    Active Hamstring Stretch Limitations  12" step    Knee: Self-Stretch to increase Flexion  Right;10 seconds;Limitations    Knee: Self-Stretch Limitations  10 reps using 12" box    Gastroc Stretch  Right;3 reps;Limitations;30 seconds    Gastroc Stretch Limitations  slant board      Knee/Hip Exercises: Aerobic   Stationary Bike  x3 mins, seat 10, 1/2, retro and fwd revolutions for increased ROM      Knee/Hip Exercises: Prone   Prone Knee Hang  4 minutes;Weights    Prone Knee Hang Weights (lbs)  2             PT Education - 03/18/18 [id number]   Education provided  Yes    Education Details  reassessment findings; added proe knee hang to HEP    Person(s) Educated  Patient    Methods  Explanation;Demonstration;Handout    Comprehension  Verbalized understanding;Returned demonstration       PT Short Term Goals - 03/18/18 0900      PT SHORT TERM GOAL #1   Title  Pt will be independent with HEP and perform consistently in order to maximize ROM and MMT.    Time  3    Period  Weeks    Status  Achieved      PT SHORT TERM GOAL #2   Title  Pt will have improved knee AROM from 5-100deg to decrease pain and maximize gait.     Baseline  3/20: 8-110deg    Time  3    Period  Weeks    Status  Partially Met      PT SHORT TERM GOAL #3   Title  Pt will have decreased edema at joint line by 5cm in order to decrease stiffness and maximzie ROM.     Baseline  3/20: decreased by 2.5cm to 46.5cm    Time  3    Period  Weeks  Status  On-going      PT SHORT TERM GOAL #4   Title  Pt will be able to perform 5xSTS in 10 sec or < with no UE and proper mechanics to demo improved functional BLE strength and balance.    Baseline  3/20: 9.76 no UE, good form    Time  3    Period  Weeks    Status  Achieved         PT Long Term Goals - 03/18/18 0901      PT LONG TERM GOAL #1   Title  Pt will have improved R knee AROM from 0-120deg in order to further maximize gait and stair ambulation.    Baseline  3/20: 8-110deg    Time  6    Period  Weeks    Status  On-going      PT LONG TERM GOAL #2   Title  Pt will have 1 grade improvement in MMT throughout in order to maximize balance, gait, and promote return to PLOF.    Baseline  3/20: see MMT    Time  6    Period  Weeks    Status  Partially Met      PT LONG TERM GOAL #3   Title  Pt will be able to perform R SLS for at least 15 sec or > to demo improve balance and maximize gait and stair ambulation.    Baseline  3/20: 27sec    Time  6    Period  Weeks    Status  Achieved      PT LONG TERM GOAL #4   Title  Pt will be able to ambualte at least 671f during the 3MWT with LRAD and gait WFL in order to maximize community access and participation.     Baseline  3/20: 73103f with gait deviations    Time  6    Period  Weeks    Status  Partially Met            Plan - 03/18/18 0942    Clinical Impression Statement  PT reassessed pt's goals and outcome measures this date. Pt has made great progress thus far as illustrated above. Her AROM is still slightly limited in extension as she was 8-110deg this date. She also still has some increased swelling at joint line but this has improved by 2.5cm since her initial evaluation. Her functional strength and balance have both improved. She was able to ambulate 73654furing 3MWT but had increasing gait deviations as time increased. Her main complaints remaining is her difficulty with extension. Continue to focus on ROM, especially extension, edema, and can begin to steadily progress her strengthening. Ended session with prone knee hang and stationary bike for mobility; pt tolerated both well.     Rehab Potential  Good    PT Frequency  3x / week    PT Duration  6 weeks    PT Treatment/Interventions   ADLs/Self Care Home Management;Cryotherapy;Electrical Stimulation;Moist Heat;Ultrasound;DME Instruction;Gait training;Stair training;Functional mobility training;Therapeutic activities;Therapeutic exercise;Balance training;Neuromuscular re-education;Patient/family education;Manual techniques;Scar mobilization;Passive range of motion;Dry needling;Energy conservation;Taping;Vestibular    PT Next Visit Plan  begin on bike for ROM; Add prone quad stretch;   Continue with manual for edema control and to reduce adhesions for increased knee mobility.  ROM based exercises for extension and progress functional strengthening as appropriate. continue prone knee hang next session and potentially couple with manual to HS for improved extension.    PT Home Exercise Plan  eval: quad set, HS stretch, SLS; SAQ, calf stretch, heel slide, standing hamstring stretch; TKE; 3/20: prone knee hang    Consulted and Agree with Plan of Care  Patient       Patient will benefit from skilled therapeutic intervention in order to improve the following deficits and impairments:  Abnormal gait, Decreased activity tolerance, Decreased balance, Decreased endurance, Decreased mobility, Decreased range of motion, Decreased strength, Difficulty walking, Hypomobility, Increased edema, Increased fascial restricitons, Increased muscle spasms, Impaired flexibility, Improper body mechanics, Pain  Visit Diagnosis: Stiffness of right knee, not elsewhere classified  Localized edema  Muscle weakness (generalized)  Difficulty in walking, not elsewhere classified     Problem List Patient Active Problem List   Diagnosis Date Noted  . Primary localized osteoarthritis of right knee 02/10/2018  . Primary osteoarthritis of right knee 02/10/2018  . Body mass index 40.0-44.9, adult ([place]) 05/05/2017  . Vertigo 03/19/2016  . Ear infection 03/19/2016  . Elevated LFTs 05/11/2015  . Hyperglycemia 05/11/2015  . Obesity 05/11/2015  . Tobacco abuse  05/11/2015  . Cholelithiases   . Thickened endometrium 05/02/2014  . Depression 04/15/2013  . HTN (hypertension) 04/14/2013  . Anxiety 04/14/2013  . OSA (obstructive sleep apnea) 03/20/2012  . Essential hypertension, benign 11/05/2011  . Nonspecific abnormal electrocardiogram (ECG) (EKG) 11/05/2011  . Pain in limb 11/05/2011        Geraldine Solar PT, DPT  [place] [address] [place], [place], [address] Phone: [phone]   Fax:  [phone]  Name: [name] [name] MRN: [id number] Date of Birth: 1961/07/01

## 2018-03-18 NOTE — Patient Instructions (Signed)
  Prone Knee Hang  Laying on stomach, place a rolled up towel underneath thigh just above the knee cap. Slide body down to the edge of table until knee cap is hanging off.   Perform 1-2x/day, 5-10 minutes each -- can place bag with canned good items in it to assist in straightening knee.

## 2018-03-20 ENCOUNTER — Encounter (HOSPITAL_COMMUNITY): Payer: Self-pay

## 2018-03-20 ENCOUNTER — Ambulatory Visit (HOSPITAL_COMMUNITY): Payer: Commercial Managed Care - PPO

## 2018-03-20 DIAGNOSIS — R262 Difficulty in walking, not elsewhere classified: Secondary | ICD-10-CM

## 2018-03-20 DIAGNOSIS — M25661 Stiffness of right knee, not elsewhere classified: Secondary | ICD-10-CM

## 2018-03-20 DIAGNOSIS — R600 Localized edema: Secondary | ICD-10-CM

## 2018-03-20 DIAGNOSIS — M6281 Muscle weakness (generalized): Secondary | ICD-10-CM

## 2018-03-20 NOTE — Therapy (Signed)
[place] [address] [place], [place], [address] Phone: [phone]   Fax:  [phone]  Physical Therapy Treatment  Patient Details  Name: [name] [name] MRN: [id number] Date of Birth: 05/05/1961 Referring Provider: Melrose Nakayama, MD   Encounter Date: 03/20/2018  PT End of Session - 03/20/18 0856    Visit Number  8    Number of Visits  19    Date for PT Re-Evaluation  04/09/18    Authorization Type  UMR    Authorization Time Period  02/26/18 to 04/09/18    PT Start Time  0854 4' on bike for mobility, no charge    PT Stop Time  0940    PT Time Calculation (min)  46 min    Activity Tolerance  Patient tolerated treatment well    Behavior During Therapy  [facility] for tasks assessed/performed       Past Medical History:  Diagnosis Date  . Abnormal Pap smear   . Adult ADHD   . Anxiety   . Arthritis    "knees" (02/10/2018)  . Bipolar disorder ([place])   . Cholelithiases    04/2015  . Depression 04/15/2013  . Ear infection 03/19/2016  . Essential hypertension, benign   . ETOH abuse    drinks wine 5 day/week  . GAD (generalized anxiety disorder)   . History of blood transfusion 1980s   "related to miscarriage"  . Incompetent cervix   . Obesity   . OSA on CPAP   . Pneumonia    "used to have it alot; nothing in years" (02/10/2018)  . PTSD (post-traumatic stress disorder)   . Thickened endometrium 05/02/2014  . Vaginal Pap smear, abnormal   . Vertigo 03/19/2016    Past Surgical History:  Procedure Laterality Date  . CERVICAL CERCLAGE    . CHOLECYSTECTOMY N/A 05/24/2015   Procedure: LAPAROSCOPIC CHOLECYSTECTOMY WITH INTRAOPERATIVE CHOLANGIOGRAM;  Surgeon: Aviva Signs Md, MD;  Location: [facility];  Service: General;  Laterality: N/A;  . DILATION AND CURETTAGE OF UTERUS    . JOINT REPLACEMENT    . KNEE ARTHROSCOPY Right 2015  . TONSILLECTOMY    . 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 TOTAL KNEE ARTHROPLASTY Right 02/10/2018   Procedure: TOTAL KNEE ARTHROPLASTY;  Surgeon: Melrose Nakayama, MD;  Location: [place];  Service: Orthopedics;  Laterality: Right;    There were no vitals filed for this visit.  Subjective Assessment - 03/20/18 0854    Subjective  Pt stated she is stiff this morning, no reports of pain currently.  Has began new prone knee hang, purchased weight for hang    Patient Stated Goals  get back to normal    Currently in Pain?  No/denies    Pain Descriptors / Indicators  Tightness                      [facility] Adult PT Treatment/Exercise - 03/20/18 0001      Knee/Hip Exercises: Stretches   Active Hamstring Stretch  Right;3 reps;Limitations;30 seconds    Active Hamstring Stretch Limitations  12" step    Quad Stretch  30 seconds;3 reps prone with rope    Knee: Self-Stretch to increase Flexion  Right;10 seconds;Limitations    Knee: Self-Stretch Limitations  10 reps using 12" box    Gastroc Stretch  Right;3 reps;Limitations;30 seconds    Gastroc Stretch Limitations  slant board      Knee/Hip Exercises: Aerobic   Stationary Bike  x4 mins,  seat 10 full revolution      Knee/Hip Exercises: Standing   Heel Raises  15 reps    Knee Flexion  15 reps    Terminal Knee Extension  Right;15 reps;Theraband    Theraband Level (Terminal Knee Extension)  Level 3 (Green)    Functional Squat  15 reps cueing for mechanics     Rocker Board  2 minutes      Knee/Hip Exercises: Supine   Quad Sets  15 reps    Short Arc Quad Sets  15 reps    Heel Slides  10 reps    Knee Extension  AROM;Limitations;PROM;3 sets    Knee Extension Limitations  7    Knee Flexion  AROM;Limitations    Knee Flexion Limitations  112      Knee/Hip Exercises: Prone   Prone Knee Hang  4 minutes;Weights;Limitations    Prone Knee Hang Weights (lbs)  2    Prone Knee Hang Limitations  Manual to hamstrings medial>lateral and gastroc    Other Prone Exercises  TKE 10x 5"      Manual Therapy   Manual Therapy  Edema  management;Myofascial release;Soft tissue mobilization    Manual therapy comments  manual complete separate from rest of tx    Edema Management  retrograde massage with LE elevated    Soft tissue mobilization  hamstrings during prone knee hang medial>lateral    Myofascial Release  scar tissue               PT Short Term Goals - 03/18/18 0900      PT SHORT TERM GOAL #1   Title  Pt will be independent with HEP and perform consistently in order to maximize ROM and MMT.    Time  3    Period  Weeks    Status  Achieved      PT SHORT TERM GOAL #2   Title  Pt will have improved knee AROM from 5-100deg to decrease pain and maximize gait.     Baseline  3/20: 8-110deg    Time  3    Period  Weeks    Status  Partially Met      PT SHORT TERM GOAL #3   Title  Pt will have decreased edema at joint line by 5cm in order to decrease stiffness and maximzie ROM.     Baseline  3/20: decreased by 2.5cm to 46.5cm    Time  3    Period  Weeks    Status  On-going      PT SHORT TERM GOAL #4   Title  Pt will be able to perform 5xSTS in 10 sec or < with no UE and proper mechanics to demo improved functional BLE strength and balance.    Baseline  3/20: 9.76 no UE, good form    Time  3    Period  Weeks    Status  Achieved        PT Long Term Goals - 03/18/18 0901      PT LONG TERM GOAL #1   Title  Pt will have improved R knee AROM from 0-120deg in order to further maximize gait and stair ambulation.    Baseline  3/20: 8-110deg    Time  6    Period  Weeks    Status  On-going      PT LONG TERM GOAL #2   Title  Pt will have 1 grade improvement in MMT throughout in order to maximize balance,  gait, and promote return to PLOF.    Baseline  3/20: see MMT    Time  6    Period  Weeks    Status  Partially Met      PT LONG TERM GOAL #3   Title  Pt will be able to perform R SLS for at least 15 sec or > to demo improve balance and maximize gait and stair ambulation.    Baseline  3/20: 27sec     Time  6    Period  Weeks    Status  Achieved      PT LONG TERM GOAL #4   Title  Pt will be able to ambualte at least 647f during the 3MWT with LRAD and gait WFL in order to maximize community access and participation.     Baseline  3/20: 7370f with gait deviations    Time  6    Period  Weeks    Status  Partially Met            Plan - 03/20/18 0943    Clinical Impression Statement  Pt continues to have significant edema present proximal knee.  Session focus with knee mobility with increased focus on knee extension this session.  Added quad stretches to improve flexibilty and prone TKE for extension strengthening.  Included manual techniquest to address hamstring tightness and retro massage for edema control.  AROM at EOS 7-112 (was 8-110 last session)    Rehab Potential  Good    PT Frequency  3x / week    PT Duration  6 weeks    PT Treatment/Interventions  ADLs/Self Care Home Management;Cryotherapy;Electrical Stimulation;Moist Heat;Ultrasound;DME Instruction;Gait training;Stair training;Functional mobility training;Therapeutic activities;Therapeutic exercise;Balance training;Neuromuscular re-education;Patient/family education;Manual techniques;Scar mobilization;Passive range of motion;Dry needling;Energy conservation;Taping;Vestibular    PT Next Visit Plan  Continue wiht bike for ROM and stretches.  Manual for edema control and to reduce adhesions for increased knee mobility.  ROM based exercises for extension and progress functional strengthening as appropriate. continue prone knee hang next session and potentially couple with manual to HS for improved extension.    PT Home Exercise Plan  eval: quad set, HS stretch, SLS; SAQ, calf stretch, heel slide, standing hamstring stretch; TKE; 3/20: prone knee hang       Patient will benefit from skilled therapeutic intervention in order to improve the following deficits and impairments:  Abnormal gait, Decreased activity tolerance, Decreased  balance, Decreased endurance, Decreased mobility, Decreased range of motion, Decreased strength, Difficulty walking, Hypomobility, Increased edema, Increased fascial restricitons, Increased muscle spasms, Impaired flexibility, Improper body mechanics, Pain  Visit Diagnosis: Stiffness of right knee, not elsewhere classified  Localized edema  Muscle weakness (generalized)  Difficulty in walking, not elsewhere classified     Problem List Patient Active Problem List   Diagnosis Date Noted  . Primary localized osteoarthritis of right knee 02/10/2018  . Primary osteoarthritis of right knee 02/10/2018  . Body mass index 40.0-44.9, adult ([facility]05/06/2017  . Vertigo 03/19/2016  . Ear infection 03/19/2016  . Elevated LFTs 05/11/2015  . Hyperglycemia 05/11/2015  . Obesity 05/11/2015  . Tobacco abuse 05/11/2015  . Cholelithiases   . Thickened endometrium 05/02/2014  . Depression 04/15/2013  . HTN (hypertension) 04/14/2013  . Anxiety 04/14/2013  . OSA (obstructive sleep apnea) 03/20/2012  . Essential hypertension, benign 11/05/2011  . Nonspecific abnormal electrocardiogram (ECG) (EKG) 11/05/2011  . Pain in limb 11/05/2011   CaIhor AustinLPTA; CBCedarvilleCoAldona Lento/22/2019, 10:07 AM  [place]  [facility] [address] [place], [place], [address] Phone: [phone]   Fax:  [phone]  Name: [name] [name] MRN: [id number] Date of Birth: 1961/12/30

## 2018-03-23 ENCOUNTER — Ambulatory Visit (HOSPITAL_COMMUNITY): Payer: Commercial Managed Care - PPO

## 2018-03-23 ENCOUNTER — Encounter (HOSPITAL_COMMUNITY): Payer: Self-pay

## 2018-03-23 DIAGNOSIS — R600 Localized edema: Secondary | ICD-10-CM

## 2018-03-23 DIAGNOSIS — M6281 Muscle weakness (generalized): Secondary | ICD-10-CM

## 2018-03-23 DIAGNOSIS — R262 Difficulty in walking, not elsewhere classified: Secondary | ICD-10-CM

## 2018-03-23 DIAGNOSIS — M25661 Stiffness of right knee, not elsewhere classified: Secondary | ICD-10-CM

## 2018-03-23 NOTE — Therapy (Signed)
[facility] [address] [place], [place], [address] Phone: [phone]   Fax:  [phone]  Physical Therapy Treatment  Patient Details  Name: [name] [name] MRN: [id number] Date of Birth: Oct 03, 1961 Referring Provider: Melrose Nakayama, MD   Encounter Date: 03/23/2018  PT End of Session - 03/23/18 0948    Visit Number  9    Number of Visits  19    Date for PT Re-Evaluation  04/09/18    Authorization Type  UMR    Authorization Time Period  02/26/18 to 04/09/18    PT Start Time  0944    PT Stop Time  1026    PT Time Calculation (min)  42 min    Activity Tolerance  Patient tolerated treatment well    Behavior During Therapy  [facility] for tasks assessed/performed       Past Medical History:  Diagnosis Date  . Abnormal Pap smear   . Adult ADHD   . Anxiety   . Arthritis    "knees" (02/10/2018)  . Bipolar disorder ([place])   . Cholelithiases    04/2015  . Depression 04/15/2013  . Ear infection 03/19/2016  . Essential hypertension, benign   . ETOH abuse    drinks wine 5 day/week  . GAD (generalized anxiety disorder)   . History of blood transfusion 1980s   "related to miscarriage"  . Incompetent cervix   . Obesity   . OSA on CPAP   . Pneumonia    "used to have it alot; nothing in years" (02/10/2018)  . PTSD (post-traumatic stress disorder)   . Thickened endometrium 05/02/2014  . Vaginal Pap smear, abnormal   . Vertigo 03/19/2016    Past Surgical History:  Procedure Laterality Date  . CERVICAL CERCLAGE    . CHOLECYSTECTOMY N/A 05/24/2015   Procedure: LAPAROSCOPIC CHOLECYSTECTOMY WITH INTRAOPERATIVE CHOLANGIOGRAM;  Surgeon: Aviva Signs Md, MD;  Location: [facility];  Service: General;  Laterality: N/A;  . DILATION AND CURETTAGE OF UTERUS    . JOINT REPLACEMENT    . KNEE ARTHROSCOPY Right 2015  . TONSILLECTOMY    . 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 TOTAL KNEE ARTHROPLASTY Right 02/10/2018   Procedure: TOTAL KNEE ARTHROPLASTY;   Surgeon: Melrose Nakayama, MD;  Location: [facility];  Service: Orthopedics;  Laterality: Right;    There were no vitals filed for this visit.   Subjective Assessment - 03/23/18 0946    Subjective  Pt reports that she thought her appointment was at 9 so her knee has stiffened up waiting for her appointment. No pain, just stiffness. She feels like the swelling is going down.    Patient Stated Goals  get back to normal    Currently in Pain?  Yes    Pain Score  7     Pain Location  Knee    Pain Orientation  Right    Pain Descriptors / Indicators  Tightness    Pain Type  Surgical pain    Pain Onset  1 to 4 weeks ago    Pain Frequency  Intermittent         [facility] Adult PT Treatment/Exercise - 03/23/18 0001      Knee/Hip Exercises: Stretches   Sports administrator  Right;3 reps;30 seconds;Limitations    Sports administrator Limitations  prone with rope    Gastroc Stretch  Right;3 reps;Limitations;30 seconds    Gastroc Stretch Limitations  slant board      Knee/Hip Exercises: Control and instrumentation engineer  x4 mins, seat 9, 1/2, retro, and full revolutions for mobility      Knee/Hip Exercises: Standing   Heel Raises  15 reps;Limitations    Heel Raises Limitations  heel and toe    Terminal Knee Extension  Right    Theraband Level (Terminal Knee Extension)  Level 4 (Blue)    Terminal Knee Extension Limitations  10x10"    Lateral Step Up  Right;15 reps;Step Height: 6"    Rocker Board  2 minutes;Limitations    Rocker Board Limitations  R/L    Other Standing Knee Exercises  R heel taps on 4" step x10 reps      Knee/Hip Exercises: Supine   Knee Extension Limitations  6    Knee Flexion Limitations  116      Knee/Hip Exercises: Prone   Hamstring Curl  10 reps;Limitations    Hamstring Curl Limitations  RLE only    Contract/Relax to Increase Flexion  3x10"    Other Prone Exercises  TKE 10x 5" (2# weight for improved proprioception)      Manual Therapy   Manual Therapy  Edema management;Soft tissue  mobilization;Myofascial release    Manual therapy comments  manual complete separate from rest of tx    Edema Management  retrograde massage with LE elevated    Soft tissue mobilization  hamstrings during prone knee hang medial>lateral    Myofascial Release  scar tissue            PT Education - 03/23/18 0949    Education provided  Yes    Education Details  exercise technique    Person(s) Educated  Patient    Methods  Explanation;Demonstration    Comprehension  Verbalized understanding;Returned demonstration       PT Short Term Goals - 03/18/18 0900      PT SHORT TERM GOAL #1   Title  Pt will be independent with HEP and perform consistently in order to maximize ROM and MMT.    Time  3    Period  Weeks    Status  Achieved      PT SHORT TERM GOAL #2   Title  Pt will have improved knee AROM from 5-100deg to decrease pain and maximize gait.     Baseline  3/20: 8-110deg    Time  3    Period  Weeks    Status  Partially Met      PT SHORT TERM GOAL #3   Title  Pt will have decreased edema at joint line by 5cm in order to decrease stiffness and maximzie ROM.     Baseline  3/20: decreased by 2.5cm to 46.5cm    Time  3    Period  Weeks    Status  On-going      PT SHORT TERM GOAL #4   Title  Pt will be able to perform 5xSTS in 10 sec or < with no UE and proper mechanics to demo improved functional BLE strength and balance.    Baseline  3/20: 9.76 no UE, good form    Time  3    Period  Weeks    Status  Achieved        PT Long Term Goals - 03/18/18 0901      PT LONG TERM GOAL #1   Title  Pt will have improved R knee AROM from 0-120deg in order to further maximize gait and stair ambulation.    Baseline  3/20: 8-110deg    Time  6    Period  Weeks    Status  On-going      PT LONG TERM GOAL #2   Title  Pt will have 1 grade improvement in MMT throughout in order to maximize balance, gait, and promote return to PLOF.    Baseline  3/20: see MMT    Time  6    Period   Weeks    Status  Partially Met      PT LONG TERM GOAL #3   Title  Pt will be able to perform R SLS for at least 15 sec or > to demo improve balance and maximize gait and stair ambulation.    Baseline  3/20: 27sec    Time  6    Period  Weeks    Status  Achieved      PT LONG TERM GOAL #4   Title  Pt will be able to ambualte at least 658f during the 3MWT with LRAD and gait WFL in order to maximize community access and participation.     Baseline  3/20: 7368f with gait deviations    Time  6    Period  Weeks    Status  Partially Met            Plan - 03/23/18 1027    Clinical Impression Statement  Continued with established POC focusing on knee mobility. Added heel taps for continued quad strengthening and improved extension ROM. Pt tolerated overall session well, only reporting some stiffness during session. Added contract relax in prone for further ROM improvements. Also added contract relax and HS curls in prone. Ended with manual for edema control and soft tissue restrictions. AROM at EOS 6 to 116deg. Continue as planned, progressing as able.     Rehab Potential  Good    PT Frequency  3x / week    PT Duration  6 weeks    PT Treatment/Interventions  ADLs/Self Care Home Management;Cryotherapy;Electrical Stimulation;Moist Heat;Ultrasound;DME Instruction;Gait training;Stair training;Functional mobility training;Therapeutic activities;Therapeutic exercise;Balance training;Neuromuscular re-education;Patient/family education;Manual techniques;Scar mobilization;Passive range of motion;Dry needling;Energy conservation;Taping;Vestibular    PT Next Visit Plan  Continue wiht bike for ROM and stretches, manual for edema control and to reduce adhesions for increased knee mobility, ROM based exercises for extension and progress functional strengthening as appropriate. continue prone knee hang next session and potentially couple with manual to HS for improved extension.; continue contract relax    PT  Home Exercise Plan  eval: quad set, HS stretch, SLS; SAQ, calf stretch, heel slide, standing hamstring stretch; TKE; 3/20: prone knee hang    Consulted and Agree with Plan of Care  Patient       Patient will benefit from skilled therapeutic intervention in order to improve the following deficits and impairments:  Abnormal gait, Decreased activity tolerance, Decreased balance, Decreased endurance, Decreased mobility, Decreased range of motion, Decreased strength, Difficulty walking, Hypomobility, Increased edema, Increased fascial restricitons, Increased muscle spasms, Impaired flexibility, Improper body mechanics, Pain  Visit Diagnosis: Stiffness of right knee, not elsewhere classified  Localized edema  Muscle weakness (generalized)  Difficulty in walking, not elsewhere classified     Problem List Patient Active Problem List   Diagnosis Date Noted  . Primary localized osteoarthritis of right knee 02/10/2018  . Primary osteoarthritis of right knee 02/10/2018  . Body mass index 40.0-44.9, adult ([place]/06/2017  . Vertigo 03/19/2016  . Ear infection 03/19/2016  . Elevated LFTs 05/11/2015  . Hyperglycemia 05/11/2015  . Obesity 05/11/2015  . Tobacco abuse 05/11/2015  .  Cholelithiases   . Thickened endometrium 05/02/2014  . Depression 04/15/2013  . HTN (hypertension) 04/14/2013  . Anxiety 04/14/2013  . OSA (obstructive sleep apnea) 03/20/2012  . Essential hypertension, benign 11/05/2011  . Nonspecific abnormal electrocardiogram (ECG) (EKG) 11/05/2011  . Pain in limb 11/05/2011       Geraldine Solar PT, DPT  [facility] [address] [place], [place], [address] Phone: [phone]   Fax:  [phone]  Name: JAMYIA FORTUNE MRN: [id number] Date of Birth: [birth year]

## 2018-03-25 ENCOUNTER — Ambulatory Visit (HOSPITAL_COMMUNITY): Payer: Commercial Managed Care - PPO

## 2018-03-25 ENCOUNTER — Encounter (HOSPITAL_COMMUNITY): Payer: Self-pay

## 2018-03-25 DIAGNOSIS — M25661 Stiffness of right knee, not elsewhere classified: Secondary | ICD-10-CM

## 2018-03-25 DIAGNOSIS — M6281 Muscle weakness (generalized): Secondary | ICD-10-CM

## 2018-03-25 DIAGNOSIS — R600 Localized edema: Secondary | ICD-10-CM

## 2018-03-25 DIAGNOSIS — R262 Difficulty in walking, not elsewhere classified: Secondary | ICD-10-CM

## 2018-03-25 NOTE — Therapy (Signed)
[facility] [address] [place], [place], [address] Phone: [phone]   Fax:  [phone]  Physical Therapy Treatment  Patient Details  Name: [name] [name] MRN: [id number] Date of Birth: 1961-04-16 Referring Provider: Melrose Nakayama, MD   Encounter Date: 03/25/2018  PT End of Session - 03/25/18 0902    Visit Number  10    Number of Visits  19    Date for PT Re-Evaluation  04/09/18    Authorization Type  UMR    Authorization Time Period  02/26/18 to 04/09/18    PT Start Time  0900    PT Stop Time  0942    PT Time Calculation (min)  42 min    Activity Tolerance  Patient tolerated treatment well    Behavior During Therapy  [facility] for tasks assessed/performed       Past Medical History:  Diagnosis Date  . Abnormal Pap smear   . Adult ADHD   . Anxiety   . Arthritis    "knees" (02/10/2018)  . Bipolar disorder ([place])   . Cholelithiases    04/2015  . Depression 04/15/2013  . Ear infection 03/19/2016  . Essential hypertension, benign   . ETOH abuse    drinks wine 5 day/week  . GAD (generalized anxiety disorder)   . History of blood transfusion 1980s   "related to miscarriage"  . Incompetent cervix   . Obesity   . OSA on CPAP   . Pneumonia    "used to have it alot; nothing in years" (02/10/2018)  . PTSD (post-traumatic stress disorder)   . Thickened endometrium 05/02/2014  . Vaginal Pap smear, abnormal   . Vertigo 03/19/2016    Past Surgical History:  Procedure Laterality Date  . CERVICAL CERCLAGE    . CHOLECYSTECTOMY N/A 05/24/2015   Procedure: LAPAROSCOPIC CHOLECYSTECTOMY WITH INTRAOPERATIVE CHOLANGIOGRAM;  Surgeon: Aviva Signs Md, MD;  Location: [facility];  Service: General;  Laterality: N/A;  . DILATION AND CURETTAGE OF UTERUS    . JOINT REPLACEMENT    . KNEE ARTHROSCOPY Right 2015  . TONSILLECTOMY    . 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 TOTAL KNEE ARTHROPLASTY Right 02/10/2018   Procedure: TOTAL KNEE ARTHROPLASTY;   Surgeon: Melrose Nakayama, MD;  Location: [facility];  Service: Orthopedics;  Laterality: Right;    There were no vitals filed for this visit.  Subjective Assessment - 03/25/18 0902    Subjective  Pt states that she took a pain pill this morning for her L knee, not her R knee. Her L knee bothers her after she leaves therapy. She feels her R knee is getting there.    Patient Stated Goals  get back to normal    Currently in Pain?  No/denies    Pain Onset  1 to 4 weeks ago             [facility] Adult PT Treatment/Exercise - 03/25/18 0001      Knee/Hip Exercises: Stretches   Active Hamstring Stretch  Right;3 reps;Limitations;30 seconds    Active Hamstring Stretch Limitations  12" step    Knee: Self-Stretch to increase Flexion  Right;10 seconds;Limitations    Knee: Self-Stretch Limitations  10 reps using 12" box    Gastroc Stretch  Right;3 reps;Limitations;30 seconds      Knee/Hip Exercises: Standing   Terminal Knee Extension  Right    Theraband Level (Terminal Knee Extension)  Level 4 (Blue)    Terminal Knee Extension Limitations  10x10"  Lateral Step Up  Right;20 reps;Step Height: 6"    SLS with Vectors  x5RT on RLE    Walking with Sports Cord  Sidestepping 98f x2RT with RTB    Other Standing Knee Exercises  R heel taps on 4" step x15 reps      Knee/Hip Exercises: Supine   Knee Extension Limitations  5    Knee Flexion Limitations  120      Knee/Hip Exercises: Prone   Hamstring Curl  15 reps    Hamstring Curl Limitations  RLE only    Other Prone Exercises  TKE 15x 5" (3 weight for improved proprioception and strengthening)      Manual Therapy   Manual Therapy  Edema management;Soft tissue mobilization;Myofascial release;Joint mobilization    Manual therapy comments  manual complete separate from rest of tx    Edema Management  retrograde massage with LE elevated    Joint Mobilization  patellar mobs in all directions; AP and PA tibiofemoral joint mobs for improved flexino and  extension ROM    Soft tissue mobilization  hamstrings during prone knee hang medial>lateral    Myofascial Release  scar tissue            PT Short Term Goals - 03/18/18 0900      PT SHORT TERM GOAL #1   Title  Pt will be independent with HEP and perform consistently in order to maximize ROM and MMT.    Time  3    Period  Weeks    Status  Achieved      PT SHORT TERM GOAL #2   Title  Pt will have improved knee AROM from 5-100deg to decrease pain and maximize gait.     Baseline  3/20: 8-110deg    Time  3    Period  Weeks    Status  Partially Met      PT SHORT TERM GOAL #3   Title  Pt will have decreased edema at joint line by 5cm in order to decrease stiffness and maximzie ROM.     Baseline  3/20: decreased by 2.5cm to 46.5cm    Time  3    Period  Weeks    Status  On-going      PT SHORT TERM GOAL #4   Title  Pt will be able to perform 5xSTS in 10 sec or < with no UE and proper mechanics to demo improved functional BLE strength and balance.    Baseline  3/20: 9.76 no UE, good form    Time  3    Period  Weeks    Status  Achieved        PT Long Term Goals - 03/18/18 0901      PT LONG TERM GOAL #1   Title  Pt will have improved R knee AROM from 0-120deg in order to further maximize gait and stair ambulation.    Baseline  3/20: 8-110deg    Time  6    Period  Weeks    Status  On-going      PT LONG TERM GOAL #2   Title  Pt will have 1 grade improvement in MMT throughout in order to maximize balance, gait, and promote return to PLOF.    Baseline  3/20: see MMT    Time  6    Period  Weeks    Status  Partially Met      PT LONG TERM GOAL #3   Title  Pt will be  able to perform R SLS for at least 15 sec or > to demo improve balance and maximize gait and stair ambulation.    Baseline  3/20: 27sec    Time  6    Period  Weeks    Status  Achieved      PT LONG TERM GOAL #4   Title  Pt will be able to ambualte at least 637f during the 3MWT with LRAD and gait WFL in order  to maximize community access and participation.     Baseline  3/20: 7349f with gait deviations    Time  6    Period  Weeks    Status  Partially Met            Plan - 03/25/18 0944    Clinical Impression Statement  Continued with established POC focusing on mobility, extension > flexion, and general strengthening. Pt able to tolerate increased reps this date and continues to require min cues for proper exercise technique. Added SLS with vectors and sidestepping with RTB this date to begin to incorporate hip strengthening in order to improve overall gait and functional strength. Continued with prone work and ended session with manual STM during prone knee hang, edema management, and joint mobs for improved ROM. Pt progressing nicely as her AROM was 5-120deg this date. Continue as planned, progressing as able; monitor L knee pain during session.    Rehab Potential  Good    PT Frequency  3x / week    PT Duration  6 weeks    PT Treatment/Interventions  ADLs/Self Care Home Management;Cryotherapy;Electrical Stimulation;Moist Heat;Ultrasound;DME Instruction;Gait training;Stair training;Functional mobility training;Therapeutic activities;Therapeutic exercise;Balance training;Neuromuscular re-education;Patient/family education;Manual techniques;Scar mobilization;Passive range of motion;Dry needling;Energy conservation;Taping;Vestibular    PT Next Visit Plan  Continue wiht bike for ROM and stretches, manual for edema control and to reduce adhesions for increased knee mobility, ROM based exercises for extension and progress functional strengthening as appropriate; prone knee hang next session and potentially couple with manual to HS for improved extension, contract relax, and joint mobs for ROM    PT Home Exercise Plan  eval: quad set, HS stretch, SLS; SAQ, calf stretch, heel slide, standing hamstring stretch; TKE; 3/20: prone knee hang    Consulted and Agree with Plan of Care  Patient       Patient  will benefit from skilled therapeutic intervention in order to improve the following deficits and impairments:  Abnormal gait, Decreased activity tolerance, Decreased balance, Decreased endurance, Decreased mobility, Decreased range of motion, Decreased strength, Difficulty walking, Hypomobility, Increased edema, Increased fascial restricitons, Increased muscle spasms, Impaired flexibility, Improper body mechanics, Pain  Visit Diagnosis: Stiffness of right knee, not elsewhere classified  Localized edema  Muscle weakness (generalized)  Difficulty in walking, not elsewhere classified     Problem List Patient Active Problem List   Diagnosis Date Noted  . Primary localized osteoarthritis of right knee 02/10/2018  . Primary osteoarthritis of right knee 02/10/2018  . Body mass index 40.0-44.9, adult ([facility] [place]05/06/2017  . Vertigo 03/19/2016  . Ear infection 03/19/2016  . Elevated LFTs 05/11/2015  . Hyperglycemia 05/11/2015  . Obesity 05/11/2015  . Tobacco abuse 05/11/2015  . Cholelithiases   . Thickened endometrium 05/02/2014  . Depression 04/15/2013  . HTN (hypertension) 04/14/2013  . Anxiety 04/14/2013  . OSA (obstructive sleep apnea) 03/20/2012  . Essential hypertension, benign 11/05/2011  . Nonspecific abnormal electrocardiogram (ECG) (EKG) 11/05/2011  . Pain in limb 11/05/2011       BrGeraldine SolarT, DPT  [facility] [place], [place], [address] Phone: [phone]   Fax:  [phone]  Name: [name] [name] MRN: [id number] Date of Birth: 12-16-1961

## 2018-03-27 ENCOUNTER — Ambulatory Visit (HOSPITAL_COMMUNITY): Payer: Commercial Managed Care - PPO

## 2018-03-27 ENCOUNTER — Encounter (HOSPITAL_COMMUNITY): Payer: Self-pay

## 2018-03-27 DIAGNOSIS — R600 Localized edema: Secondary | ICD-10-CM

## 2018-03-27 DIAGNOSIS — M25661 Stiffness of right knee, not elsewhere classified: Secondary | ICD-10-CM | POA: Diagnosis not present

## 2018-03-27 DIAGNOSIS — R262 Difficulty in walking, not elsewhere classified: Secondary | ICD-10-CM

## 2018-03-27 DIAGNOSIS — M6281 Muscle weakness (generalized): Secondary | ICD-10-CM

## 2018-03-27 NOTE — Therapy (Signed)
[facility] [address] [place], [place], [address] Phone: [phone]   Fax:  [phone]  Physical Therapy Treatment  Patient Details  Name: [name] [name] MRN: [id number] Date of Birth: 02-May-1961 Referring Provider: Melrose Nakayama, MD   Encounter Date: 03/27/2018  PT End of Session - 03/27/18 0951    Visit Number  11    Number of Visits  19    Date for PT Re-Evaluation  04/09/18    Authorization Type  UMR    Authorization Time Period  02/26/18 to 04/09/18    PT Start Time  0952 pt late to appointment    PT Stop Time  1031    PT Time Calculation (min)  39 min    Activity Tolerance  Patient tolerated treatment well    Behavior During Therapy  [facility] for tasks assessed/performed       Past Medical History:  Diagnosis Date  . Abnormal Pap smear   . Adult ADHD   . Anxiety   . Arthritis    "knees" (02/10/2018)  . Bipolar disorder ([place])   . Cholelithiases    04/2015  . Depression 04/15/2013  . Ear infection 03/19/2016  . Essential hypertension, benign   . ETOH abuse    drinks wine 5 day/week  . GAD (generalized anxiety disorder)   . History of blood transfusion 1980s   "related to miscarriage"  . Incompetent cervix   . Obesity   . OSA on CPAP   . Pneumonia    "used to have it alot; nothing in years" (02/10/2018)  . PTSD (post-traumatic stress disorder)   . Thickened endometrium 05/02/2014  . Vaginal Pap smear, abnormal   . Vertigo 03/19/2016    Past Surgical History:  Procedure Laterality Date  . CERVICAL CERCLAGE    . CHOLECYSTECTOMY N/A 05/24/2015   Procedure: LAPAROSCOPIC CHOLECYSTECTOMY WITH INTRAOPERATIVE CHOLANGIOGRAM;  Surgeon: Aviva Signs Md, MD;  Location: [facility];  Service: General;  Laterality: N/A;  . DILATION AND CURETTAGE OF UTERUS    . JOINT REPLACEMENT    . KNEE ARTHROSCOPY Right 2015  . TONSILLECTOMY    . 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 TOTAL KNEE ARTHROPLASTY Right 02/10/2018   Procedure: TOTAL  KNEE ARTHROPLASTY;  Surgeon: Melrose Nakayama, MD;  Location: [facility];  Service: Orthopedics;  Laterality: Right;    There were no vitals filed for this visit.  Subjective Assessment - 03/27/18 0952    Subjective  Pt reports that she is a little stiff, her L knee feels a little better, but her lower back is stiff this morning as well. She states that she cleaned her friends house for 4 hours yesterday and was leaning over a lot.    Patient Stated Goals  get back to normal    Currently in Pain?  No/denies    Pain Onset  1 to 4 weeks ago         [facility] PT Assessment - 03/27/18 0001      Circumferential Edema   Circumferential - Right  45.75cm         [facility] Adult PT Treatment/Exercise - 03/27/18 0001      Knee/Hip Exercises: Aerobic   Stationary Bike  x4 mins, seat 8, 1/2, retro, and full revolutions for mobility      Knee/Hip Exercises: Machines for Strengthening   Cybex Knee Extension  3 plates, BLE, 3x15 reps    Cybex Knee Flexion  5 plates, BLE, 3x15  Knee/Hip Exercises: Standing   Lateral Step Up  Both;15 reps;Step Height: 6"    SLS with Vectors  x5RT on BLE on foam    Walking with Sports Cord  Sidestepping 61f x2RT with RTB      Knee/Hip Exercises: Supine   Knee Extension Limitations  5    Knee Flexion Limitations  116      Knee/Hip Exercises: Prone   Hamstring Curl  15 reps    Hamstring Curl Limitations  3#, RLE    Other Prone Exercises  TKE 15x 5" (3 weight for improved proprioception and strengthening)      Manual Therapy   Manual Therapy  Edema management;Soft tissue mobilization;Joint mobilization    Manual therapy comments  manual complete separate from rest of tx    Edema Management  retrograde massage with LE elevated    Joint Mobilization  AP and PA tibiofemoral joint mobs for improved flexino and extension ROM    Soft tissue mobilization  hamstrings during prone knee hang medial>lateral            PT Education - 03/27/18 1032    Education  provided  Yes    Education Details  exercise technqiue    Person(s) Educated  Patient    Methods  Explanation;Demonstration    Comprehension  Verbalized understanding;Returned demonstration       PT Short Term Goals - 03/18/18 0900      PT SHORT TERM GOAL #1   Title  Pt will be independent with HEP and perform consistently in order to maximize ROM and MMT.    Time  3    Period  Weeks    Status  Achieved      PT SHORT TERM GOAL #2   Title  Pt will have improved knee AROM from 5-100deg to decrease pain and maximize gait.     Baseline  3/20: 8-110deg    Time  3    Period  Weeks    Status  Partially Met      PT SHORT TERM GOAL #3   Title  Pt will have decreased edema at joint line by 5cm in order to decrease stiffness and maximzie ROM.     Baseline  3/20: decreased by 2.5cm to 46.5cm    Time  3    Period  Weeks    Status  On-going      PT SHORT TERM GOAL #4   Title  Pt will be able to perform 5xSTS in 10 sec or < with no UE and proper mechanics to demo improved functional BLE strength and balance.    Baseline  3/20: 9.76 no UE, good form    Time  3    Period  Weeks    Status  Achieved        PT Long Term Goals - 03/18/18 0901      PT LONG TERM GOAL #1   Title  Pt will have improved R knee AROM from 0-120deg in order to further maximize gait and stair ambulation.    Baseline  3/20: 8-110deg    Time  6    Period  Weeks    Status  On-going      PT LONG TERM GOAL #2   Title  Pt will have 1 grade improvement in MMT throughout in order to maximize balance, gait, and promote return to PLOF.    Baseline  3/20: see MMT    Time  6    Period  Weeks  Status  Partially Met      PT LONG TERM GOAL #3   Title  Pt will be able to perform R SLS for at least 15 sec or > to demo improve balance and maximize gait and stair ambulation.    Baseline  3/20: 27sec    Time  6    Period  Weeks    Status  Achieved      PT LONG TERM GOAL #4   Title  Pt will be able to ambualte at  least 661f during the 3MWT with LRAD and gait WFL in order to maximize community access and participation.     Baseline  3/20: 7338f with gait deviations    Time  6    Period  Weeks    Status  Partially Met            Plan - 03/27/18 1047    Clinical Impression Statement  Pt reporting she cleaned her friend's house for 4 hours yesterday and was bending over a lot so her back is a little sore today. Added cybex knee flexion and extension machines this date for improved HS and quad strengthening; pt performed with BLE and tolerated well, not reporting any pain. Pt challenged with SLS on foam with vectors. Ended with manual for edema control and joint mobilization. AROM 5-116deg this date; slight decrease in flexion likely due to pt being up on it more yesterday and causing some increased tightness. Will trial decreasing pt's frequency to 2x/week next week and potentially the following week, pending her response next week.     Rehab Potential  Good    PT Frequency  3x / week    PT Duration  6 weeks    PT Treatment/Interventions  ADLs/Self Care Home Management;Cryotherapy;Electrical Stimulation;Moist Heat;Ultrasound;DME Instruction;Gait training;Stair training;Functional mobility training;Therapeutic activities;Therapeutic exercise;Balance training;Neuromuscular re-education;Patient/family education;Manual techniques;Scar mobilization;Passive range of motion;Dry needling;Energy conservation;Taping;Vestibular    PT Next Visit Plan  continue with functional strengthening, mobility, hip strengthening. continue manual for edema, joint mob, ROM, continue prone knee hang +manual; continue contract relax and continue cybex machines; progressing in all aspects as able.    PT Home Exercise Plan  eval: quad set, HS stretch, SLS; SAQ, calf stretch, heel slide, standing hamstring stretch; TKE; 3/20: prone knee hang    Consulted and Agree with Plan of Care  Patient       Patient will benefit from skilled  therapeutic intervention in order to improve the following deficits and impairments:  Abnormal gait, Decreased activity tolerance, Decreased balance, Decreased endurance, Decreased mobility, Decreased range of motion, Decreased strength, Difficulty walking, Hypomobility, Increased edema, Increased fascial restricitons, Increased muscle spasms, Impaired flexibility, Improper body mechanics, Pain  Visit Diagnosis: Stiffness of right knee, not elsewhere classified  Localized edema  Muscle weakness (generalized)  Difficulty in walking, not elsewhere classified     Problem List Patient Active Problem List   Diagnosis Date Noted  . Primary localized osteoarthritis of right knee 02/10/2018  . Primary osteoarthritis of right knee 02/10/2018  . Body mass index 40.0-44.9, adult ([place]05/06/2017  . Vertigo 03/19/2016  . Ear infection 03/19/2016  . Elevated LFTs 05/11/2015  . Hyperglycemia 05/11/2015  . Obesity 05/11/2015  . Tobacco abuse 05/11/2015  . Cholelithiases   . Thickened endometrium 05/02/2014  . Depression 04/15/2013  . HTN (hypertension) 04/14/2013  . Anxiety 04/14/2013  . OSA (obstructive sleep apnea) 03/20/2012  . Essential hypertension, benign 11/05/2011  . Nonspecific abnormal electrocardiogram (ECG) (EKG) 11/05/2011  . Pain in  limb 11/05/2011       Geraldine Solar PT, DPT  [facility] [address] [place], [place], [address] Phone: [phone]   Fax:  [phone]  Name: [name] [name] MRN: [id number] Date of Birth: May 25, 1961

## 2018-04-01 ENCOUNTER — Encounter (HOSPITAL_COMMUNITY): Payer: Self-pay

## 2018-04-01 ENCOUNTER — Ambulatory Visit (HOSPITAL_COMMUNITY): Payer: Commercial Managed Care - PPO | Attending: Orthopaedic Surgery

## 2018-04-01 DIAGNOSIS — R600 Localized edema: Secondary | ICD-10-CM | POA: Diagnosis present

## 2018-04-01 DIAGNOSIS — M6281 Muscle weakness (generalized): Secondary | ICD-10-CM | POA: Diagnosis present

## 2018-04-01 DIAGNOSIS — M25661 Stiffness of right knee, not elsewhere classified: Secondary | ICD-10-CM | POA: Diagnosis not present

## 2018-04-01 DIAGNOSIS — R262 Difficulty in walking, not elsewhere classified: Secondary | ICD-10-CM | POA: Insufficient documentation

## 2018-04-01 NOTE — Therapy (Signed)
[facility] [address] [place], [place], [address] Phone: [phone]   Fax:  [phone]  Physical Therapy Treatment  Patient Details  Name: [name] [name] MRN: [id number] Date of Birth: December 05, 1961 Referring Provider: Melrose Nakayama, MD   Encounter Date: 04/01/2018  PT End of Session - 04/01/18 1027    Visit Number  12    Number of Visits  19    Date for PT Re-Evaluation  04/09/18    Authorization Type  UMR    Authorization Time Period  02/26/18 to 04/09/18    PT Start Time  1025    PT Stop Time  1111    PT Time Calculation (min)  46 min    Activity Tolerance  Patient tolerated treatment well    Behavior During Therapy  [facility] for tasks assessed/performed       Past Medical History:  Diagnosis Date  . Abnormal Pap smear   . Adult ADHD   . Anxiety   . Arthritis    "knees" (02/10/2018)  . Bipolar disorder ([place])   . Cholelithiases    04/2015  . Depression 04/15/2013  . Ear infection 03/19/2016  . Essential hypertension, benign   . ETOH abuse    drinks wine 5 day/week  . GAD (generalized anxiety disorder)   . History of blood transfusion 1980s   "related to miscarriage"  . Incompetent cervix   . Obesity   . OSA on CPAP   . Pneumonia    "used to have it alot; nothing in years" (02/10/2018)  . PTSD (post-traumatic stress disorder)   . Thickened endometrium 05/02/2014  . Vaginal Pap smear, abnormal   . Vertigo 03/19/2016    Past Surgical History:  Procedure Laterality Date  . CERVICAL CERCLAGE    . CHOLECYSTECTOMY N/A 05/24/2015   Procedure: LAPAROSCOPIC CHOLECYSTECTOMY WITH INTRAOPERATIVE CHOLANGIOGRAM;  Surgeon: Aviva Signs Md, MD;  Location: [facility];  Service: General;  Laterality: N/A;  . DILATION AND CURETTAGE OF UTERUS    . JOINT REPLACEMENT    . KNEE ARTHROSCOPY Right 2015  . TONSILLECTOMY    . 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 TOTAL KNEE ARTHROPLASTY Right 02/10/2018   Procedure: TOTAL KNEE ARTHROPLASTY;   Surgeon: Melrose Nakayama, MD;  Location: [facility];  Service: Orthopedics;  Laterality: Right;    There were no vitals filed for this visit.  Subjective Assessment - 04/01/18 1028    Subjective  Pt reports that her husband was sick over the weekend so she didn't do a lot of her exercises. She denies any pain this morning.     Patient Stated Goals  get back to normal    Currently in Pain?  No/denies    Pain Onset  1 to 4 weeks ago            [facility] Adult PT Treatment/Exercise - 04/01/18 0001      Knee/Hip Exercises: Aerobic   Stationary Bike  x4 mins, seat 7, 1/2, retro, and full revolutions for mobility      Knee/Hip Exercises: Machines for Strengthening   Cybex Knee Extension  3 plates, BLE, 3x15 reps    Cybex Knee Flexion  5 plates, BLE, 3x15       Knee/Hip Exercises: Standing   Hip Abduction  Both;15 reps;Limitations    Abduction Limitations  GTB    Forward Step Up  Right;15 reps;Step Height: 6";Limitations    Forward Step Up Limitations  slow, eccentric control    Functional Squat  15 reps cues for form    Stairs  x3RT    SLS  on foam + palov press with RTB x15 reps each    SLS with Vectors  x5RT on BLE on foam    Walking with Sports Cord  Sidestepping 98f x3RT with GTB      Knee/Hip Exercises: Supine   Short Arc Quad Sets  Right;15 reps;Limitations    Short Arc Quad Sets Limitations  basketball, 3#    Bridges  15 reps    Straight Leg Raises  Right;15 reps;Limitations    Straight Leg Raises Limitations  slight knee lag    Knee Extension Limitations  5    Knee Flexion Limitations  122      Knee/Hip Exercises: Prone   Other Prone Exercises  TKE 15x 5" (3 weight for improved proprioception and strengthening)      Manual Therapy   Manual Therapy  Joint mobilization    Manual therapy comments  manual complete separate from rest of tx    Joint Mobilization  AP and PA tibiofemoral joint mobs for improved flexino and extension ROM            PT Education - 04/01/18  1028    Education provided  Yes    Education Details  exercise technique, continued HEP    Person(s) Educated  Patient    Methods  Demonstration;Explanation    Comprehension  Verbalized understanding;Returned demonstration       PT Short Term Goals - 03/18/18 0900      PT SHORT TERM GOAL #1   Title  Pt will be independent with HEP and perform consistently in order to maximize ROM and MMT.    Time  3    Period  Weeks    Status  Achieved      PT SHORT TERM GOAL #2   Title  Pt will have improved knee AROM from 5-100deg to decrease pain and maximize gait.     Baseline  3/20: 8-110deg    Time  3    Period  Weeks    Status  Partially Met      PT SHORT TERM GOAL #3   Title  Pt will have decreased edema at joint line by 5cm in order to decrease stiffness and maximzie ROM.     Baseline  3/20: decreased by 2.5cm to 46.5cm    Time  3    Period  Weeks    Status  On-going      PT SHORT TERM GOAL #4   Title  Pt will be able to perform 5xSTS in 10 sec or < with no UE and proper mechanics to demo improved functional BLE strength and balance.    Baseline  3/20: 9.76 no UE, good form    Time  3    Period  Weeks    Status  Achieved        PT Long Term Goals - 03/18/18 0901      PT LONG TERM GOAL #1   Title  Pt will have improved R knee AROM from 0-120deg in order to further maximize gait and stair ambulation.    Baseline  3/20: 8-110deg    Time  6    Period  Weeks    Status  On-going      PT LONG TERM GOAL #2   Title  Pt will have 1 grade improvement in MMT throughout in order to maximize balance, gait, and promote return to PLOF.  Baseline  3/20: see MMT    Time  6    Period  Weeks    Status  Partially Met      PT LONG TERM GOAL #3   Title  Pt will be able to perform R SLS for at least 15 sec or > to demo improve balance and maximize gait and stair ambulation.    Baseline  3/20: 27sec    Time  6    Period  Weeks    Status  Achieved      PT LONG TERM GOAL #4   Title   Pt will be able to ambualte at least 675f during the 3MWT with LRAD and gait WFL in order to maximize community access and participation.     Baseline  3/20: 7312f with gait deviations    Time  6    Period  Weeks    Status  Partially Met            Plan - 04/01/18 1113    Clinical Impression Statement  Continued with established POC focusing on improving knee, hip and functional strength. Pt reporting increased tightness with stair descent. Added hip abd for further hip strengthening. Pt noted to have slight extension lag during SLR so resumed SAQ with weight. Pt's AROM 6-120deg before manual joint mobs and was 5-122deg following. Updated strengthening HEP this date to include hip and knee strengthening. Continue as planned, progressing as able.    Rehab Potential  Good    PT Frequency  3x / week    PT Duration  6 weeks    PT Treatment/Interventions  ADLs/Self Care Home Management;Cryotherapy;Electrical Stimulation;Moist Heat;Ultrasound;DME Instruction;Gait training;Stair training;Functional mobility training;Therapeutic activities;Therapeutic exercise;Balance training;Neuromuscular re-education;Patient/family education;Manual techniques;Scar mobilization;Passive range of motion;Dry needling;Energy conservation;Taping;Vestibular    PT Next Visit Plan  continue with functional strengthening, mobility, hip strengthening. continue manual for edema, joint mob, ROM, continue prone knee hang +manual; continue cybex machines; continue SAQ with weight and SLR; progressing in all aspects as able.    PT Home Exercise Plan  eval: quad set, HS stretch, SLS; SAQ, calf stretch, heel slide, standing hamstring stretch; TKE; 3/20: prone knee hang; 4/3: see below for detailed additions    Consulted and Agree with Plan of Care  Patient       Patient will benefit from skilled therapeutic intervention in order to improve the following deficits and impairments:  Abnormal gait, Decreased activity tolerance,  Decreased balance, Decreased endurance, Decreased mobility, Decreased range of motion, Decreased strength, Difficulty walking, Hypomobility, Increased edema, Increased fascial restricitons, Increased muscle spasms, Impaired flexibility, Improper body mechanics, Pain  Visit Diagnosis: Stiffness of right knee, not elsewhere classified  Localized edema  Muscle weakness (generalized)  Difficulty in walking, not elsewhere classified     Problem List Patient Active Problem List   Diagnosis Date Noted  . Primary localized osteoarthritis of right knee 02/10/2018  . Primary osteoarthritis of right knee 02/10/2018  . Body mass index 40.0-44.9, adult ([facility]05/06/2017  . Vertigo 03/19/2016  . Ear infection 03/19/2016  . Elevated LFTs 05/11/2015  . Hyperglycemia 05/11/2015  . Obesity 05/11/2015  . Tobacco abuse 05/11/2015  . Cholelithiases   . Thickened endometrium 05/02/2014  . Depression 04/15/2013  . HTN (hypertension) 04/14/2013  . Anxiety 04/14/2013  . OSA (obstructive sleep apnea) 03/20/2012  . Essential hypertension, benign 11/05/2011  . Nonspecific abnormal electrocardiogram (ECG) (EKG) 11/05/2011  . Pain in limb 11/05/2011       BrGeraldine SolarT, DPT  [facility]  [facility] [address] [place], [place], [address] Phone: [phone]   Fax:  [phone]  Name: BRAELYN JENSON MRN: [id number] Date of Birth: 1961/12/30

## 2018-04-01 NOTE — Patient Instructions (Signed)
Access Code: [email or web address]  URL: [email or web address]  Date: 04/01/2018  Prepared by: Jac CanavanBrooke Ayana Imhof   Exercises  Sidelying Hip Abduction - 10 reps - 3 sets - 1x daily - 7x weekly  Standing Hip Abduction with Theraband Resistance - 10 reps - 3 sets - 1x daily - 7x weekly  Sit to Stand - 10 reps - 3 sets - 1x daily - 7x weekly  Step Up - 10 reps - 3 sets - 1x daily - 7x weekly  Side Stepping with Resistance at Feet - 10 reps - 3 sets - 1x daily - 7x weekly  Single Leg Balance on Pillow - 10 reps - 3 sets - 1x daily - 7x weekly  Standing 3-Way Kick - 10 reps - 3 sets - 1x daily - 7x weekly  Seated Long Arc Quad with Ankle Weight - 10 reps - 3 sets - 1x daily - 7x weekly  Prone Hamstring Curl with Ankle Weight Table - 10 reps - 3 sets - 1x daily - 7x weekly

## 2018-04-06 ENCOUNTER — Ambulatory Visit (HOSPITAL_COMMUNITY): Payer: Commercial Managed Care - PPO | Admitting: Physical Therapy

## 2018-04-06 DIAGNOSIS — M25661 Stiffness of right knee, not elsewhere classified: Secondary | ICD-10-CM

## 2018-04-06 DIAGNOSIS — M6281 Muscle weakness (generalized): Secondary | ICD-10-CM

## 2018-04-06 DIAGNOSIS — R262 Difficulty in walking, not elsewhere classified: Secondary | ICD-10-CM

## 2018-04-06 DIAGNOSIS — R600 Localized edema: Secondary | ICD-10-CM

## 2018-04-06 NOTE — Therapy (Signed)
[facility] [address] [place], [place], [address] Phone: [phone]   Fax:  [phone]  Physical Therapy Treatment  Patient Details  Name: [name] [name] MRN: [id number] Date of Birth: 1961/04/04 Referring Provider: Melrose Nakayama, MD   Encounter Date: 04/06/2018  PT End of Session - 04/06/18 1205    Visit Number  13    Number of Visits  19    Date for PT Re-Evaluation  04/09/18    Authorization Type  [facility]    Authorization Time Period  02/26/18 to 04/09/18    PT Start Time  1035    PT Stop Time  1116    PT Time Calculation (min)  41 min    Activity Tolerance  Patient tolerated treatment well    Behavior During Therapy  [facility] for tasks assessed/performed       Past Medical History:  Diagnosis Date  . Abnormal Pap smear   . Adult ADHD   . Anxiety   . Arthritis    "knees" (02/10/2018)  . Bipolar disorder ([place])   . Cholelithiases    04/2015  . Depression 04/15/2013  . Ear infection 03/19/2016  . Essential hypertension, benign   . ETOH abuse    drinks wine 5 day/week  . GAD (generalized anxiety disorder)   . History of blood transfusion 1980s   "related to miscarriage"  . Incompetent cervix   . Obesity   . OSA on CPAP   . Pneumonia    "used to have it alot; nothing in years" (02/10/2018)  . PTSD (post-traumatic stress disorder)   . Thickened endometrium 05/02/2014  . Vaginal Pap smear, abnormal   . Vertigo 03/19/2016    Past Surgical History:  Procedure Laterality Date  . CERVICAL CERCLAGE    . CHOLECYSTECTOMY N/A 05/24/2015   Procedure: LAPAROSCOPIC CHOLECYSTECTOMY WITH INTRAOPERATIVE CHOLANGIOGRAM;  Surgeon: Aviva Signs Md, MD;  Location: [facility];  Service: General;  Laterality: N/A;  . DILATION AND CURETTAGE OF UTERUS    . JOINT REPLACEMENT    . KNEE ARTHROSCOPY Right 2015  . TONSILLECTOMY    . 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 TOTAL KNEE ARTHROPLASTY Right 02/10/2018   Procedure: TOTAL KNEE ARTHROPLASTY;   Surgeon: Melrose Nakayama, MD;  Location: [facility];  Service: Orthopedics;  Laterality: Right;    There were no vitals filed for this visit.  Subjective Assessment - 04/06/18 1208    Subjective  Pt states she did alot of yardwork on Sunday and is sore today.  States she also went up and down alot of steps and has some pain at knee cap but not enough to give a pain rating.     Currently in Pain?  No/denies                       [facility] Adult PT Treatment/Exercise - 04/06/18 0001      Knee/Hip Exercises: Aerobic   Stationary Bike  x4 mins, seat 7, 1/2, retro, and full revolutions for mobility      Knee/Hip Exercises: Machines for Strengthening   Cybex Knee Extension  3 plates, BLE, 3x15 reps    Cybex Knee Flexion  5 plates, BLE, 3x15       Knee/Hip Exercises: Standing   Functional Squat  15 reps    Walking with Sports Cord  Sidestepping 46f x3RT with GTB      Knee/Hip Exercises: Supine   Short Arc Quad Sets  Right;15 reps;Limitations  Short Arc [employer] Limitations  roll 3#    Bridges  15 reps    Straight Leg Raises  Right;15 reps;Limitations    Straight Leg Raises Limitations  slight knee lag    Knee Extension Limitations  6    Knee Flexion Limitations  120      Manual Therapy   Manual Therapy  Myofascial release    Manual therapy comments  manual complete separate from rest of tx    Myofascial Release  scar tissue               PT Short Term Goals - 03/18/18 0900      PT SHORT TERM GOAL #1   Title  Pt will be independent with HEP and perform consistently in order to maximize ROM and MMT.    Time  3    Period  Weeks    Status  Achieved      PT SHORT TERM GOAL #2   Title  Pt will have improved knee AROM from 5-100deg to decrease pain and maximize gait.     Baseline  3/20: 8-110deg    Time  3    Period  Weeks    Status  Partially Met      PT SHORT TERM GOAL #3   Title  Pt will have decreased edema at joint line by 5cm in order to decrease  stiffness and maximzie ROM.     Baseline  3/20: decreased by 2.5cm to 46.5cm    Time  3    Period  Weeks    Status  On-going      PT SHORT TERM GOAL #4   Title  Pt will be able to perform 5xSTS in 10 sec or < with no UE and proper mechanics to demo improved functional BLE strength and balance.    Baseline  3/20: 9.76 no UE, good form    Time  3    Period  Weeks    Status  Achieved        PT Long Term Goals - 03/18/18 0901      PT LONG TERM GOAL #1   Title  Pt will have improved R knee AROM from 0-120deg in order to further maximize gait and stair ambulation.    Baseline  3/20: 8-110deg    Time  6    Period  Weeks    Status  On-going      PT LONG TERM GOAL #2   Title  Pt will have 1 grade improvement in MMT throughout in order to maximize balance, gait, and promote return to PLOF.    Baseline  3/20: see MMT    Time  6    Period  Weeks    Status  Partially Met      PT LONG TERM GOAL #3   Title  Pt will be able to perform R SLS for at least 15 sec or > to demo improve balance and maximize gait and stair ambulation.    Baseline  3/20: 27sec    Time  6    Period  Weeks    Status  Achieved      PT LONG TERM GOAL #4   Title  Pt will be able to ambualte at least 656f during the 3MWT with LRAD and gait WFL in order to maximize community access and participation.     Baseline  3/20: 739f with gait deviations    Time  6    Period  Weeks    Status  Partially Met            Plan - 04/06/18 1206    Clinical Impression Statement  Continued with focus on Rt knee ROM and functional strengthen.  Pt with soreness from stairs and working in the yard.  Held stairs today and completed exercses that did not aggrevate soreness.  Pt with slightly reduced ROM at 6-120 (was 5-122 last session) but has some increased edema.  Encouraged to ice several times today and focus on ROM.     Rehab Potential  Good    PT Frequency  3x / week    PT Duration  6 weeks    PT  Treatment/Interventions  ADLs/Self Care Home Management;Cryotherapy;Electrical Stimulation;Moist Heat;Ultrasound;DME Instruction;Gait training;Stair training;Functional mobility training;Therapeutic activities;Therapeutic exercise;Balance training;Neuromuscular re-education;Patient/family education;Manual techniques;Scar mobilization;Passive range of motion;Dry needling;Energy conservation;Taping;Vestibular    PT Next Visit Plan  continue with functional strengthening, mobility, hip strengthening. continue manual for edema, joint mob, ROM, continue prone knee hang +manual; continue cybex machines; continue SAQ with weight and SLR; progressing in all aspects as able.    PT Home Exercise Plan  eval: quad set, HS stretch, SLS; SAQ, calf stretch, heel slide, standing hamstring stretch; TKE; 3/20: prone knee hang; 4/3: see below for detailed additions    Consulted and Agree with Plan of Care  Patient       Patient will benefit from skilled therapeutic intervention in order to improve the following deficits and impairments:  Abnormal gait, Decreased activity tolerance, Decreased balance, Decreased endurance, Decreased mobility, Decreased range of motion, Decreased strength, Difficulty walking, Hypomobility, Increased edema, Increased fascial restricitons, Increased muscle spasms, Impaired flexibility, Improper body mechanics, Pain  Visit Diagnosis: Stiffness of right knee, not elsewhere classified  Localized edema  Muscle weakness (generalized)  Difficulty in walking, not elsewhere classified     Problem List Patient Active Problem List   Diagnosis Date Noted  . Primary localized osteoarthritis of right knee 02/10/2018  . Primary osteoarthritis of right knee 02/10/2018  . Body mass index 40.0-44.9, adult ([place]) 05/05/2017  . Vertigo 03/19/2016  . Ear infection 03/19/2016  . Elevated LFTs 05/11/2015  . Hyperglycemia 05/11/2015  . Obesity 05/11/2015  . Tobacco abuse 05/11/2015  .  Cholelithiases   . Thickened endometrium 05/02/2014  . Depression 04/15/2013  . HTN (hypertension) 04/14/2013  . Anxiety 04/14/2013  . OSA (obstructive sleep apnea) 03/20/2012  . Essential hypertension, benign 11/05/2011  . Nonspecific abnormal electrocardiogram (ECG) (EKG) 11/05/2011  . Pain in limb 11/05/2011   Teena Irani, PTA/CLT [phone]  Teena Irani 04/06/2018, 12:10 PM  [facility] [place], [place], [address] Phone: [phone]   Fax:  [phone]  Name: [name] [name] MRN: [id number] Date of Birth: [birth year]

## 2018-04-08 ENCOUNTER — Ambulatory Visit (HOSPITAL_COMMUNITY): Payer: Commercial Managed Care - PPO

## 2018-04-08 ENCOUNTER — Telehealth (HOSPITAL_COMMUNITY): Payer: Self-pay

## 2018-04-08 NOTE — Telephone Encounter (Signed)
04/08/18  she left a message to [facility] said she may have a virus

## 2018-04-20 ENCOUNTER — Encounter (HOSPITAL_COMMUNITY): Payer: Self-pay

## 2018-04-20 NOTE — Therapy (Signed)
[facility] [place], [place], [address] Phone: [phone]   Fax:  [phone]  Patient Details  Name: [name] [name] MRN: [id number] Date of Birth: [birth year] Referring Provider:  No ref. provider found  Encounter Date: 04/20/2018   PT returned pt's phone call from last week regarding using her bike at home. PT stated that pt could try it and see how her knee responds; if it put her in increased pain then she could go to her local gym and use their stationary bike versus her bike (one without the back rest). PT inquired about why she hadn't been in therapy since 04/06/18 and she stated that her MD told her she really didn't need to continue coming to therapy and that she could do her exercises at home so that is what she is trying to do. She wishes to be d/c today.   PHYSICAL THERAPY DISCHARGE SUMMARY  Visits from Start of Care: 13  Current functional level related to goals / functional outcomes: See last treatment note   Remaining deficits: See last treatment note   Education / Equipment: n/a Plan: Patient agrees to discharge.  Patient goals were partially met. Patient is being discharged due to the patient's request.  ?????      Geraldine Solar PT, [facility] [address] [place], [place], [address] Phone: [phone]   Fax:  [phone]

## 2018-05-07 ENCOUNTER — Other Ambulatory Visit: Payer: BLUE CROSS/BLUE SHIELD | Admitting: Adult Health

## 2018-05-13 ENCOUNTER — Other Ambulatory Visit: Payer: Self-pay | Admitting: Adult Health

## 2018-05-13 MED ORDER — LISINOPRIL 10 MG PO TABS: ORAL_TABLET | ORAL | 6 refills | Status: DC

## 2018-05-13 NOTE — Progress Notes (Signed)
Refill lisinopril 

## 2018-05-20 ENCOUNTER — Encounter: Payer: Self-pay | Admitting: Gastroenterology

## 2018-05-20 ENCOUNTER — Telehealth: Payer: Self-pay | Admitting: *Deleted

## 2018-05-20 NOTE — Telephone Encounter (Signed)
Per patient she had no family hx of colon cancer just colon polyps . Per Dr Lavon Paganini her recall date needed to be changed to 06/30/2023  Explained to patient the change in her recall date.

## 2018-06-11 ENCOUNTER — Other Ambulatory Visit: Payer: Self-pay | Admitting: Adult Health

## 2018-06-15 ENCOUNTER — Encounter: Payer: Self-pay | Admitting: Adult Health

## 2018-06-15 ENCOUNTER — Ambulatory Visit (INDEPENDENT_AMBULATORY_CARE_PROVIDER_SITE_OTHER): Payer: Commercial Managed Care - PPO | Admitting: Adult Health

## 2018-06-15 VITALS — BP 130/66 | HR 62 | Ht 65.0 in | Wt 255.0 lb

## 2018-06-15 DIAGNOSIS — Z713 Dietary counseling and surveillance: Secondary | ICD-10-CM

## 2018-06-15 DIAGNOSIS — Z01419 Encounter for gynecological examination (general) (routine) without abnormal findings: Secondary | ICD-10-CM | POA: Insufficient documentation

## 2018-06-15 DIAGNOSIS — Z6841 Body Mass Index (BMI) 40.0 and over, adult: Secondary | ICD-10-CM

## 2018-06-15 DIAGNOSIS — Z1211 Encounter for screening for malignant neoplasm of colon: Secondary | ICD-10-CM

## 2018-06-15 DIAGNOSIS — I10 Essential (primary) hypertension: Secondary | ICD-10-CM

## 2018-06-15 DIAGNOSIS — Z01411 Encounter for gynecological examination (general) (routine) with abnormal findings: Secondary | ICD-10-CM | POA: Diagnosis not present

## 2018-06-15 DIAGNOSIS — Z1212 Encounter for screening for malignant neoplasm of rectum: Secondary | ICD-10-CM

## 2018-06-15 LAB — HEMOCCULT GUIAC POC 1CARD (OFFICE): FECAL OCCULT BLD: NEGATIVE

## 2018-06-15 MED ORDER — PHENTERMINE HCL 37.5 MG PO TABS: 37.5000 mg | ORAL_TABLET | Freq: Every day | ORAL | 0 refills | Status: DC

## 2018-06-15 NOTE — Progress Notes (Signed)
Patient ID: [name] [name], female   DOB: 12/13/1961, 57 y.o.   MRN: [id number] History of Present Illness: [name] [name] is a 57 year old white female,married in for a well woman gyn exam, she had a normal pap with negative HPV 05/05/17. She had right knee replacement in February, had done well, but then had fall in May.She has gained some weight and wants get back on working on losing some weight.She wants to get the sleeve, and has even considered [place].  She see Deatra RobinsonKaren Jones too.  [facility]   Current Medications, Allergies, Past Medical History, Past Surgical History, Family History and Social History were reviewed in [facility] electronic medical record.     Review of Systems: Patient denies any headaches, hearing loss, fatigue, blurred vision, shortness of breath, chest pain, abdominal pain, problems with bowel movements, urination, or intercourse. No joint pain or mood swings. +weight gain    Physical Exam:BP 130/66 (BP Location: Left Arm, Patient Position: Sitting, Cuff Size: Normal)   Pulse 62   Ht 5\' 5"  (1.651 m)   Wt 255 lb (115.7 kg)   BMI 42.43 kg/m  General:  Well developed, well nourished, no acute distress Skin:  Warm and dry Neck:  Midline trachea, normal thyroid, good ROM, no lymphadenopathy Lungs; Clear to auscultation bilaterally Breast:  No dominant palpable mass, retraction, or nipple discharge Cardiovascular: Regular rate and rhythm Abdomen:  Soft, non tender, no hepatosplenomegaly Pelvic:  External genitalia is normal in appearance, no lesions.  The vagina is normal in appearance. Urethra has no lesions or masses. The cervix is bulbous.  Uterus is felt to be normal size, shape, and contour.  No adnexal masses or tenderness noted.Bladder is non tender, no masses felt. Rectal: Good sphincter tone, no polyps, or hemorrhoids felt.  Hemoccult negative. Extremities/musculoskeletal:  No varicosities noted, no clubbing or cyanosis,+swelling in right knee Psych:  No  mood changes, alert and cooperative,seems happy PHQ 2 score 0. Will rx adipex, increase water, decrease carbs, walk when able.  Impression: 1. Encounter for well woman exam with routine gynecological exam   2. Essential hypertension, benign   3. Screening for colorectal cancer   4. Body mass index 40.0-44.9, adult (H[facility])   5. Weight loss counseling, encounter for       Plan: Meds ordered this encounter  Medications  . phentermine (ADIPEX-P) 37.5 MG tablet    Sig: Take 1 tablet (37.5 mg total) by mouth daily before breakfast.    Dispense:  30 tablet    Refill:  0    Order Specific Question:   Supervising Provider    Answer:   Lazaro ArmsEURE, LUTHER H [[id number]]   Has refills on BP meds F/U in 4 weeks for weight and BP check Physical in 1 year  Pap in 2021 Mammogram yearly

## 2018-06-17 ENCOUNTER — Telehealth: Payer: Self-pay | Admitting: *Deleted

## 2018-06-17 NOTE — Telephone Encounter (Signed)
Pharmacy made aware Phentermine approved.

## 2018-06-22 ENCOUNTER — Encounter: Payer: Self-pay | Admitting: Adult Health

## 2018-06-22 ENCOUNTER — Ambulatory Visit: Payer: BLUE CROSS/BLUE SHIELD | Admitting: Adult Health

## 2018-06-23 ENCOUNTER — Ambulatory Visit (INDEPENDENT_AMBULATORY_CARE_PROVIDER_SITE_OTHER): Payer: Commercial Managed Care - PPO | Admitting: Adult Health

## 2018-06-23 ENCOUNTER — Encounter: Payer: Self-pay | Admitting: Adult Health

## 2018-06-23 DIAGNOSIS — G4733 Obstructive sleep apnea (adult) (pediatric): Secondary | ICD-10-CM | POA: Diagnosis not present

## 2018-06-23 NOTE — Assessment & Plan Note (Signed)
Well-controlled on CPAP  Plan  Patient Instructions  Continue on CPAP at bedtime Work on healthy weight Do not drive a sleepy Follow-up with Dr. Vassie LollAlva in 1 year and as needed

## 2018-06-23 NOTE — Progress Notes (Signed)
 @Patient  ID: [name] [name], female    DOB: [birth year], 57 y.o.   MRN: [id number]  Chief Complaint  Patient presents with  . Follow-up    OSA    Referring provider: [facility]  HPI: 57 year old female followed for obstructive sleep apnea  HST 2013:  AHI 14/hr. - 220 lbs Auto 2013:  Optimal pressure 12cm.     06/23/2018 Follow up ; OSA  Patient returns for a one-year follow-up for sleep apnea.  Patient says she is doing well on CPAP.  She feels rested.  Uses her CPAP each night for about 7-8  hours.  She denies any daytime sleepiness.  Download shows excellent compliance with average usage at 8 hours.  Patient is on CPAP 5 to 20 cm H2O.  AHI 0.8. Is working on weight loss .   No Known Allergies  Immunization History  Administered Date(s) Administered  . Influenza Split 09/29/2017  . Influenza Whole 09/30/2011, 09/29/2012    Past Medical History:  Diagnosis Date  . Abnormal Pap smear   . Adult ADHD   . Anxiety   . Arthritis    "knees" (02/10/2018)  . Bipolar disorder ([facility])   . Cholelithiases    04/2015  . Depression 04/15/2013  . Ear infection 03/19/2016  . Essential hypertension, benign   . ETOH abuse    drinks wine 5 day/week  . GAD (generalized anxiety disorder)   . History of blood transfusion 1980s   "related to miscarriage"  . Incompetent cervix   . Obesity   . OSA on CPAP   . Pneumonia    "used to have it alot; nothing in years" (02/10/2018)  . PTSD (post-traumatic stress disorder)   . Thickened endometrium 05/02/2014  . Vaginal Pap smear, abnormal   . Vertigo 03/19/2016    Tobacco History: Social History   Tobacco Use  Smoking Status Former Smoker  . Packs/day: 1.00  . Years: 25.00  . Pack years: 25.00  . Types: Cigarettes  . Last attempt to quit: 12/30/2005  . Years since quitting: 12.4  Smokeless Tobacco Never Used   Counseling given: Not Answered   Outpatient Encounter Medications as of 06/23/2018  Medication Sig  .  ALPRAZolam (XANAX) 0.25 MG tablet Take 0.25 mg by mouth at bedtime as needed for sleep.  Marland Kitchen. escitalopram (LEXAPRO) 20 MG tablet TAKE 1 TABLET(20 MG) BY MOUTH DAILY (Patient taking differently: TAKE 10 MG BY MOUTH DAILY)  . hydrochlorothiazide (HYDRODIURIL) 25 MG tablet TAKE 1 TABLET(25 MG) BY MOUTH DAILY  . lamoTRIgine (LAMICTAL) 200 MG tablet Take 100-200 mg by mouth See admin instructions. Take 100 mg in the morning and 200 mg in the evening  . lisinopril (PRINIVIL,ZESTRIL) 10 MG tablet TAKE 1 TABLET(10 MG) BY MOUTH DAILY  . phentermine (ADIPEX-P) 37.5 MG tablet Take 1 tablet (37.5 mg total) by mouth daily before breakfast.   No facility-administered encounter medications on file as of 06/23/2018.      Review of Systems  Constitutional:   No  weight loss, night sweats,  Fevers, chills, fatigue, or  lassitude.  HEENT:   No headaches,  Difficulty swallowing,  Tooth/dental problems, or  Sore throat,                No sneezing, itching, ear ache, nasal congestion, post nasal drip,   CV:  No chest pain,  Orthopnea, PND, swelling in lower extremities, anasarca, dizziness, palpitations, syncope.   GI  No heartburn, indigestion, abdominal pain, nausea, vomiting, diarrhea,  change in bowel habits, loss of appetite, bloody stools.   Resp: No shortness of breath with exertion or at rest.  No excess mucus, no productive cough,  No non-productive cough,  No coughing up of blood.  No change in color of mucus.  No wheezing.  No chest wall deformity  Skin: no rash or lesions.  GU: no dysuria, change in color of urine, no urgency or frequency.  No flank pain, no hematuria      Physical Exam  BP 128/78 (BP Location: Left Arm, Cuff Size: Normal)   Pulse 73   Ht 5\' 6"  (1.676 m)   Wt 246 lb (111.6 kg)   SpO2 98%   BMI 39.71 kg/m   GEN: A/Ox3; pleasant , NAD, obese    HEENT:  [place]/AT,  EACs-clear, TMs-wnl, NOSE-clear, THROAT-clear, no lesions, no postnasal drip or exudate noted. Class 2-3 MP airway    NECK:  Supple w/ fair ROM; no JVD; normal carotid impulses w/o bruits; no thyromegaly or nodules palpated; no lymphadenopathy.    RESP  Clear  P & A; w/o, wheezes/ rales/ or rhonchi. no accessory muscle use, no dullness to percussion  CARD:  RRR, no m/r/g, no peripheral edema, pulses intact, no cyanosis or clubbing.  GI:   Soft & nt; nml bowel sounds; no organomegaly or masses detected.   Musco: Warm bil, no deformities or joint swelling noted.   Neuro: alert, no focal deficits noted.    Skin: Warm, no lesions or rashes    Lab Results:  CBC  No results found for: BNP  ProBNP No results found for: PROBNP  Imaging: No results found.   Assessment & Plan:   OSA (obstructive sleep apnea) Well-controlled on CPAP  Plan  Patient Instructions  Continue on CPAP at bedtime Work on healthy weight Do not drive a sleepy Follow-up with Dr. Vassie Loll in 1 year and as needed     Obesity Wt loss      Rubye Oaks, NP 06/23/2018

## 2018-06-23 NOTE — Patient Instructions (Signed)
Continue on CPAP at bedtime Work on healthy weight Do not drive a sleepy Follow-up with Dr. Vassie LollAlva in 1 year and as needed

## 2018-06-23 NOTE — Assessment & Plan Note (Signed)
Wt loss  

## 2018-07-03 ENCOUNTER — Other Ambulatory Visit: Payer: Self-pay | Admitting: Adult Health

## 2018-07-09 ENCOUNTER — Ambulatory Visit (INDEPENDENT_AMBULATORY_CARE_PROVIDER_SITE_OTHER): Payer: Commercial Managed Care - PPO | Admitting: Adult Health

## 2018-07-09 ENCOUNTER — Encounter: Payer: Self-pay | Admitting: Adult Health

## 2018-07-09 VITALS — BP 142/82 | HR 79 | Ht 65.0 in | Wt 245.0 lb

## 2018-07-09 DIAGNOSIS — Z6841 Body Mass Index (BMI) 40.0 and over, adult: Secondary | ICD-10-CM

## 2018-07-09 DIAGNOSIS — Z713 Dietary counseling and surveillance: Secondary | ICD-10-CM

## 2018-07-09 MED ORDER — PHENTERMINE HCL 37.5 MG PO TABS: 37.5000 mg | ORAL_TABLET | Freq: Every day | ORAL | 0 refills | Status: DC

## 2018-07-09 NOTE — Progress Notes (Signed)
  Subjective:     Patient ID: [name] [name], female   DOB: [birth year], 57 y.o.   MRN: [id number]  HPI [name] [name] is a 57 year old white female, in for weight and BP check.   Review of Systems +weight loss with adipex(was more on scales at home) Reviewed past medical,surgical, social and family history. Reviewed medications and allergies.     Objective:   Physical Exam BP (!) 142/82 (BP Location: Right Arm, Patient Position: Sitting, Cuff Size: Large)   Pulse 79   Ht 5\' 5"  (1.651 m)   Wt 245 lb (111.1 kg)   BMI 40.77 kg/m   Skin warm and dry. Lungs: clear to ausculation bilaterally. Cardiovascular: regular rate and rhythm. She lost 10 lbs, in last month, praised over her efforts.    Assessment:     1. Weight loss counseling, encounter for   2. Body mass index 40.0-44.9, adult ([facility])       Plan:     Meds ordered this encounter  Medications  . phentermine (ADIPEX-P) 37.5 MG tablet    Sig: Take 1 tablet (37.5 mg total) by mouth daily before breakfast.    Dispense:  30 tablet    Refill:  0    Order Specific Question:   Supervising Provider    Answer:   Despina HiddenEURE, LUTHER H [[id number]]  F/U in 4 weeks

## 2018-07-30 ENCOUNTER — Ambulatory Visit: Payer: Medicare Other | Admitting: Adult Health

## 2018-08-13 ENCOUNTER — Encounter: Payer: Self-pay | Admitting: Adult Health

## 2018-08-13 ENCOUNTER — Ambulatory Visit (INDEPENDENT_AMBULATORY_CARE_PROVIDER_SITE_OTHER): Payer: Commercial Managed Care - PPO | Admitting: Adult Health

## 2018-08-13 VITALS — BP 129/85 | HR 97 | Ht 65.0 in | Wt 235.0 lb

## 2018-08-13 DIAGNOSIS — Z6839 Body mass index (BMI) 39.0-39.9, adult: Secondary | ICD-10-CM | POA: Diagnosis not present

## 2018-08-13 DIAGNOSIS — Z713 Dietary counseling and surveillance: Secondary | ICD-10-CM

## 2018-08-13 MED ORDER — PHENTERMINE HCL 37.5 MG PO TABS: 37.5000 mg | ORAL_TABLET | Freq: Every day | ORAL | 0 refills | Status: DC

## 2018-08-13 NOTE — Progress Notes (Signed)
  Subjective:     Patient ID: [name] [name], female   DOB: 01-Aug-1961, 57 y.o.   MRN: [id number]  HPI [name] is a 57 year old white female,married in for weight and BP check. Is losing weight and has no complaints.   Review of Systems +weight loss Knees feel better  Reviewed past medical,surgical, social and family history. Reviewed medications and allergies.     Objective:   Physical Exam BP 129/85 (BP Location: Left Arm, Patient Position: Sitting, Cuff Size: Large)   Pulse 97   Ht 5\' 5"  (1.651 m)   Wt 235 lb (106.6 kg)   BMI 39.11 kg/m  Skin warm and dry. Lungs: clear to ausculation bilaterally. Cardiovascular: regular rate and rhythm.Lost 10 lbs this month for total of 20 lbs.    Will continue adipex.  Assessment:       ICD-10-CM   1. Weight loss counseling, encounter for Z71.3   2. Body mass index 39.0-39.9, adult Z68.39       Plan:     Meds ordered this encounter  Medications  . phentermine (ADIPEX-P) 37.5 MG tablet    Sig: Take 1 tablet (37.5 mg total) by mouth daily before breakfast.    Dispense:  30 tablet    Refill:  0    Order Specific Question:   Supervising Provider    Answer:   Despina HiddenEURE, LUTHER H [[id number]]  F/U in 4 weeks

## 2018-09-09 ENCOUNTER — Ambulatory Visit: Payer: Medicare Other | Admitting: Adult Health

## 2018-10-01 ENCOUNTER — Telehealth: Payer: Self-pay | Admitting: *Deleted

## 2018-10-01 MED ORDER — PHENTERMINE HCL 37.5 MG PO TABS: 37.5000 mg | ORAL_TABLET | Freq: Every day | ORAL | 0 refills | Status: DC

## 2018-10-01 NOTE — Telephone Encounter (Signed)
Pt requests refill on adipex, missed appt, but will reschedule

## 2018-10-27 ENCOUNTER — Ambulatory Visit (INDEPENDENT_AMBULATORY_CARE_PROVIDER_SITE_OTHER): Payer: Commercial Managed Care - PPO | Admitting: Adult Health

## 2018-10-27 ENCOUNTER — Encounter: Payer: Self-pay | Admitting: Adult Health

## 2018-10-27 ENCOUNTER — Other Ambulatory Visit: Payer: Self-pay

## 2018-10-27 ENCOUNTER — Encounter (INDEPENDENT_AMBULATORY_CARE_PROVIDER_SITE_OTHER): Payer: Self-pay

## 2018-10-27 VITALS — BP 152/72 | HR 89 | Ht 65.0 in | Wt 228.0 lb

## 2018-10-27 DIAGNOSIS — Z713 Dietary counseling and surveillance: Secondary | ICD-10-CM | POA: Diagnosis not present

## 2018-10-27 DIAGNOSIS — Z6837 Body mass index (BMI) 37.0-37.9, adult: Secondary | ICD-10-CM | POA: Diagnosis not present

## 2018-10-27 NOTE — Progress Notes (Signed)
  Subjective:     Patient ID: [name] [name], female   DOB: 18-Dec-1961, 57 y.o.   MRN: [id number]  HPI [name] [name] is a 57 year old white female, married in for a weight and BP check.She went to [place] and still lost weight.  Review of Systems +weight loss Feels better Reviewed past medical,surgical, social and family history. Reviewed medications and allergies.     Objective:   Physical Exam BP (!) 152/72 (BP Location: Right Arm, Patient Position: Sitting, Cuff Size: Large)   Pulse 89   Ht 5\' 5"  (1.651 m)   Wt 228 lb (103.4 kg)   BMI 37.94 kg/m    Skin warm and dry. Lungs: clear to ausculation bilaterally. Cardiovascular: regular rate and rhythm. Has lost 7 more lbs for total of 27. She wants to continue adipex.Has even more motivation, as son getting married next year. And she is wanting to not have to get left replaced, yet.   Assessment:     1. Weight loss counseling, encounter for   2. Body mass index 37.0-37.9, adult       Plan:     Has current Rx of adipex F/U in about 5 weeks

## 2018-10-29 ENCOUNTER — Ambulatory Visit: Payer: Medicare Other | Admitting: Adult Health

## 2018-11-08 ENCOUNTER — Other Ambulatory Visit: Payer: Self-pay | Admitting: Adult Health

## 2018-12-03 ENCOUNTER — Encounter: Payer: Self-pay | Admitting: Adult Health

## 2018-12-03 ENCOUNTER — Ambulatory Visit (INDEPENDENT_AMBULATORY_CARE_PROVIDER_SITE_OTHER): Payer: Commercial Managed Care - PPO | Admitting: Adult Health

## 2018-12-03 VITALS — BP 159/91 | HR 83 | Ht 65.0 in | Wt 228.4 lb

## 2018-12-03 DIAGNOSIS — Z713 Dietary counseling and surveillance: Secondary | ICD-10-CM

## 2018-12-03 DIAGNOSIS — Z6838 Body mass index (BMI) 38.0-38.9, adult: Secondary | ICD-10-CM | POA: Diagnosis not present

## 2018-12-03 MED ORDER — PHENTERMINE HCL 37.5 MG PO TABS: ORAL_TABLET | ORAL | 0 refills | Status: DC

## 2018-12-03 NOTE — Progress Notes (Addendum)
  Subjective:     Patient ID: [name] [name], female   DOB: 1961-07-10, 57 y.o.   MRN: [id number]  HPI [name] [name] is a 57 year old white female in for weight and BP check.  Review of Systems Did not lose this month Knees feel better Reviewed past medical,surgical, social and family history. Reviewed medications and allergies.     Objective:   Physical Exam BP (!) 159/91 (BP Location: Left Arm, Patient Position: Sitting, Cuff Size: Normal)   Pulse 83   Ht 5\' 5"  (1.651 m)   Wt 228 lb 6.4 oz (103.6 kg)   BMI 38.01 kg/m    Skin warm and dry. Lungs: clear to ausculation bilaterally. Cardiovascular: regular rate and rhythm. Stressed this week over family issues. Fall risk is lo.PHQ 2 score 0. She wants to continue adipex.    Assessment:     1. Weight loss counseling, encounter for   2. Body mass index 38.0-38.9, adult       Plan:    Watch those carbs  Meds ordered this encounter  Medications  . phentermine (ADIPEX-P) 37.5 MG tablet    Sig: TAKE 1 CAPSULE BY MOUTH DAILY BEFORE BREAKFAST    Dispense:  30 tablet    Refill:  0    Order Specific Question:   Supervising Provider    Answer:   Despina HiddenEURE, LUTHER H [[id number]]  F/U in 4 weeks, can do labs then

## 2019-01-12 ENCOUNTER — Other Ambulatory Visit: Payer: Self-pay | Admitting: Adult Health

## 2019-01-28 ENCOUNTER — Ambulatory Visit: Payer: Medicare Other | Admitting: Adult Health

## 2019-02-02 ENCOUNTER — Other Ambulatory Visit: Payer: Self-pay | Admitting: Adult Health

## 2019-03-02 ENCOUNTER — Other Ambulatory Visit: Payer: Self-pay | Admitting: Adult Health

## 2019-03-03 ENCOUNTER — Telehealth: Payer: Self-pay | Admitting: Adult Health

## 2019-03-03 MED ORDER — PHENTERMINE HCL 37.5 MG PO TABS: ORAL_TABLET | ORAL | 0 refills | Status: DC

## 2019-03-03 NOTE — Telephone Encounter (Signed)
[name] [name] would like for [name] [name] to give her a call today sometime.

## 2019-03-03 NOTE — Telephone Encounter (Signed)
Refilled adipex, appt 3/19

## 2019-03-08 ENCOUNTER — Telehealth: Payer: Self-pay | Admitting: *Deleted

## 2019-03-08 NOTE — Telephone Encounter (Signed)
Erroneous encounter

## 2019-03-18 ENCOUNTER — Ambulatory Visit (INDEPENDENT_AMBULATORY_CARE_PROVIDER_SITE_OTHER): Payer: Commercial Managed Care - PPO | Admitting: Adult Health

## 2019-03-18 ENCOUNTER — Encounter: Payer: Self-pay | Admitting: Adult Health

## 2019-03-18 ENCOUNTER — Other Ambulatory Visit: Payer: Self-pay

## 2019-03-18 VITALS — BP 143/86 | HR 80 | Ht 65.0 in | Wt 213.0 lb

## 2019-03-18 DIAGNOSIS — Z6835 Body mass index (BMI) 35.0-35.9, adult: Secondary | ICD-10-CM

## 2019-03-18 DIAGNOSIS — Z713 Dietary counseling and surveillance: Secondary | ICD-10-CM | POA: Diagnosis not present

## 2019-03-18 MED ORDER — PHENTERMINE HCL 37.5 MG PO TABS: ORAL_TABLET | ORAL | 0 refills | Status: DC

## 2019-03-18 NOTE — Progress Notes (Signed)
Patient ID: [name] [name], female   DOB: 08/14/1961, 58 y.o.   MRN: [id number] History of Present Illness: [name] [name] is a 58 year old white female, married in for a weight and BP check.    Current Medications, Allergies, Past Medical History, Past Surgical History, Family History and Social History were reviewed in [employer] record.     Review of Systems: Knees feel much better    Physical Exam:BP (!) 143/86 (BP Location: Left Arm, Patient Position: Sitting, Cuff Size: Normal)   Pulse 80   Ht 5\' 5"  (1.651 m)   Wt 213 lb (96.6 kg)   BMI 35.45 kg/m  General:  Well developed, well nourished, no acute distress Skin:  Warm and dry Lungs; Clear to auscultation bilaterally Cardiovascular: Regular rate and rhythm Psych:  No mood changes, alert and cooperative,seems happy Has lost a total of 42 lbs since last June.Her goal is below 200.   Impression: 1. Weight loss counseling, encounter for   2. Body mass index 35.0-35.9, adult       Plan:  Meds ordered this encounter  Medications  . phentermine (ADIPEX-P) 37.5 MG tablet    Sig: TAKE 1 TABLET BY MOUTH EVERY DAY BEFORE BREAKFAST    Dispense:  30 tablet    Refill:  0    Order Specific Question:   Supervising Provider    Answer:   Despina Hidden, LUTHER H [[id number]]  F/U in 4 weeks

## 2019-04-14 ENCOUNTER — Telehealth: Payer: Self-pay | Admitting: *Deleted

## 2019-04-14 NOTE — Telephone Encounter (Signed)
Called patient and changed her visit to telephone visit. Patient appreciative and has a scale and bp cuff to check tomorrow morning when we call her.

## 2019-04-15 ENCOUNTER — Encounter: Payer: Self-pay | Admitting: Adult Health

## 2019-04-15 ENCOUNTER — Ambulatory Visit (INDEPENDENT_AMBULATORY_CARE_PROVIDER_SITE_OTHER): Payer: Commercial Managed Care - PPO | Admitting: Adult Health

## 2019-04-15 ENCOUNTER — Other Ambulatory Visit: Payer: Self-pay

## 2019-04-15 VITALS — BP 133/76 | HR 91 | Ht 65.0 in | Wt 214.9 lb

## 2019-04-15 DIAGNOSIS — Z713 Dietary counseling and surveillance: Secondary | ICD-10-CM | POA: Diagnosis not present

## 2019-04-15 DIAGNOSIS — Z6835 Body mass index (BMI) 35.0-35.9, adult: Secondary | ICD-10-CM

## 2019-04-15 MED ORDER — PHENTERMINE HCL 37.5 MG PO TABS: ORAL_TABLET | ORAL | 0 refills | Status: DC

## 2019-04-15 NOTE — Progress Notes (Signed)
Patient ID: [name] [name], female   DOB: Jan 19, 1961, 58 y.o.   MRN: [id number]   TELEHEALTH VIRTUAL GYNECOLOGY VISIT ENCOUNTER NOTE  I connected with [name] [name] on 04/15/19 at  9:30 AM EDT by telephone at home and verified that I am speaking with the correct person using two identifiers.   I discussed the limitations, risks, security and privacy concerns of performing an evaluation and management service by telephone and the availability of in person appointments. I also discussed with the patient that there may be a patient responsible charge related to this service. The patient expressed understanding and agreed to proceed.   History:  [name] [name] is a 58 y.o. [phone] female being evaluated today for weight loss,she is on adipex.She gained 1.9 lbs this time but has been home and is caring for father in law and has been wanting to eat but she has lost about 40 lbs in total.  She denies any headaches, trouble sleeping, or other concerns.       Past Medical History:  Diagnosis Date  . Abnormal Pap smear   . Adult ADHD   . Anxiety   . Arthritis    "knees" (02/10/2018)  . Bipolar disorder (HCC)   . Cholelithiases    04/2015  . Depression 04/15/2013  . Ear infection 03/19/2016  . Essential hypertension, benign   . ETOH abuse    drinks wine 5 day/week  . GAD (generalized anxiety disorder)   . History of blood transfusion 1980s   "related to miscarriage"  . Incompetent cervix   . Obesity   . OSA on CPAP   . Pneumonia    "used to have it alot; nothing in years" (02/10/2018)  . PTSD (post-traumatic stress disorder)   . Thickened endometrium 05/02/2014  . Vaginal Pap smear, abnormal   . Vertigo 03/19/2016   Past Surgical History:  Procedure Laterality Date  . CERVICAL CERCLAGE    . CHOLECYSTECTOMY N/A 05/24/2015   Procedure: LAPAROSCOPIC CHOLECYSTECTOMY WITH INTRAOPERATIVE CHOLANGIOGRAM;  Surgeon: Franky MachoMark Jenkins Md, MD;  Location: [facility];  Service: General;  Laterality: N/A;  .  DILATION AND CURETTAGE OF UTERUS    . JOINT REPLACEMENT    . KNEE ARTHROSCOPY Right 2015  . TONSILLECTOMY    . 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 TOTAL KNEE ARTHROPLASTY Right 02/10/2018   Procedure: TOTAL KNEE ARTHROPLASTY;  Surgeon: Marcene Corningalldorf, Peter, MD;  Location: [facility];  Service: Orthopedics;  Laterality: Right;   The following portions of the patient's history were reviewed and updated as appropriate: allergies, current medications, past family history, past medical history, past social history, past surgical history and problem list.   Health Maintenance:  Normal pap and negative HRHPV on 05/06/27.  Normal mammogram on 01/15/18   Review of Systems:  Pertinent items noted in HPI and remainder of comprehensive ROS otherwise negative.  Physical Exam:   General:  Alert, oriented and cooperative.   Mental Status: Normal mood and affect perceived. Normal judgment and thought content.  Physical exam deferred due to nature of the encounter, but pt took at home.BP 133/76 (BP Location: Left Arm, Patient Position: Sitting, Cuff Size: Normal)   Pulse 91   Ht 5\' 5"  (1.651 m)   Wt 214 lb 14.4 oz (97.5 kg)   BMI 35.76 kg/m   Labs and Imaging No results found for this or any previous visit (from the past 336 hour(s)). No results found.    Assessment and Plan:     1.  Weight loss counseling, encounter for -continue weight loss efforts, try to walk more Meds ordered this encounter  Medications  . phentermine (ADIPEX-P) 37.5 MG tablet    Sig: TAKE 1 TABLET BY MOUTH EVERY DAY BEFORE BREAKFAST    Dispense:  30 tablet    Refill:  0    Order Specific Question:   Supervising Provider    Answer:   Despina Hidden, LUTHER H [[id number]]  F/U in 4 weeks for tele health visit   2. Body mass index 35.0-35.9, adult        I discussed the assessment and treatment plan with the patient. The patient was provided an opportunity to ask questions and all were answered. The patient agreed with the plan and  demonstrated an understanding of the instructions.   The patient was advised to call back or seek an in-person evaluation/go to the ED if the symptoms worsen or if the condition fails to improve as anticipated.  I provided 5 minutes of non-face-to-face time during this encounter.   Cyril Mourning, NP [facility] [employer], [facility]

## 2019-04-22 ENCOUNTER — Telehealth: Payer: Self-pay | Admitting: *Deleted

## 2019-04-22 ENCOUNTER — Other Ambulatory Visit: Payer: Self-pay | Admitting: Adult Health

## 2019-04-22 MED ORDER — PHENTERMINE HCL 37.5 MG PO TABS: ORAL_TABLET | ORAL | 0 refills | Status: DC

## 2019-04-22 MED ORDER — LISINOPRIL 10 MG PO TABS: ORAL_TABLET | ORAL | 6 refills | Status: DC
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
## 2019-04-22 NOTE — Telephone Encounter (Signed)
Encounter closed. JSY 
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
## 2019-04-27 ENCOUNTER — Telehealth: Payer: Self-pay | Admitting: Adult Health

## 2019-04-27 DIAGNOSIS — G4733 Obstructive sleep apnea (adult) (pediatric): Secondary | ICD-10-CM

## 2019-04-27 NOTE — Telephone Encounter (Signed)
ATC patient.  Phone continued to ring, no VM. Unable to leave message.
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
## 2019-04-29 NOTE — Telephone Encounter (Signed)
LVM for patient to let her know the CPAP order will be sent to [facility] and to look out for their call since they will need to arrange delivery and/or pick up.  Order placed. Nothing further needed at this time.
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
## 2019-05-13 ENCOUNTER — Ambulatory Visit: Payer: Commercial Managed Care - PPO | Admitting: Adult Health

## 2019-07-01 ENCOUNTER — Other Ambulatory Visit: Payer: Self-pay | Admitting: Adult Health

## 2019-07-22 ENCOUNTER — Other Ambulatory Visit: Payer: Self-pay | Admitting: Adult Health

## 2019-07-29 ENCOUNTER — Telehealth: Payer: Self-pay | Admitting: Adult Health

## 2019-07-29 NOTE — Telephone Encounter (Signed)
Pt would like to see if there is medication to help her not sweat so much.

## 2019-07-30 NOTE — Telephone Encounter (Signed)
Left message I called 

## 2019-08-04 ENCOUNTER — Telehealth: Payer: Self-pay | Admitting: Adult Health

## 2019-08-04 NOTE — Telephone Encounter (Signed)
Patient would like for Anderson Malta to call her.  Her son is getting married in September, she states she sweats a lot and would like something for that.  [place]

## 2019-08-05 NOTE — Telephone Encounter (Signed)
Has excessive sweating on face, I recommend seeing dermatologist

## 2019-09-01 ENCOUNTER — Other Ambulatory Visit: Payer: Self-pay | Admitting: Adult Health

## 2019-12-09 ENCOUNTER — Telehealth: Payer: Self-pay | Admitting: *Deleted

## 2019-12-09 NOTE — Telephone Encounter (Signed)
Wants to talk to [place] about weight. Thanks!!

## 2019-12-10 NOTE — Telephone Encounter (Signed)
Make a virtual appt next Tuesday with me, has gained weight and wants to get back on adipex.

## 2019-12-10 NOTE — Telephone Encounter (Signed)
Left message I called 

## 2019-12-18 ENCOUNTER — Other Ambulatory Visit: Payer: Self-pay | Admitting: Adult Health

## 2020-02-11 ENCOUNTER — Other Ambulatory Visit: Payer: Self-pay

## 2020-02-11 ENCOUNTER — Ambulatory Visit (INDEPENDENT_AMBULATORY_CARE_PROVIDER_SITE_OTHER): Payer: Medicare HMO | Admitting: Adult Health

## 2020-02-11 ENCOUNTER — Encounter: Payer: Self-pay | Admitting: Adult Health

## 2020-02-11 DIAGNOSIS — G4733 Obstructive sleep apnea (adult) (pediatric): Secondary | ICD-10-CM | POA: Diagnosis not present

## 2020-02-11 NOTE — Addendum Note (Signed)
Addended by: Benjie Karvonen R on: 02/11/2020 03:11 PM   Modules accepted: Orders

## 2020-02-11 NOTE — Progress Notes (Signed)
Virtual Visit via Telephone Note  I connected with [name] [name] on 02/11/20 at  2:30 PM EST by telephone and verified that I am speaking with the correct person using two identifiers.  Location: Patient: Home  Provider: Office    I discussed the limitations, risks, security and privacy concerns of performing an evaluation and management service by telephone and the availability of in person appointments. I also discussed with the patient that there may be a patient responsible charge related to this service. The patient expressed understanding and agreed to proceed.   History of Present Illness: 59 year old female followed for obstructive sleep apnea  Today's  televisit is a year follow-up for sleep apnea.  Patient says over the last year she has been doing well on CPAP.  Says she feels so much better when she wears her CPAP.  She says she gets in about 7 to 8 hours each night.  She denies any significant daytime sleepiness .  Feels rested CPAP download shows excellent compliance with daily average usage at 7 hours.  Patient is on auto CPAP 5 to 20 cm H2O.  AHI 0.5.  Minimum leaks.    Observations/Objective: HST 2013: AHI 14/hr. - 220 lbs Auto 2013: Optimal pressure 12cm.    Assessment and Plan: Obstructive sleep apnea with excellent control and compliance on CPAP.  Plan  Patient Instructions  Continue on CPAP at bedtime Work on healthy weight Do not drive if sleepy Follow-up with Dr. Vassie Loll in 1 year and as needed     Follow Up Instructions: Follow-up in 1 year and as needed   I discussed the assessment and treatment plan with the patient. The patient was provided an opportunity to ask questions and all were answered. The patient agreed with the plan and demonstrated an understanding of the instructions.   The patient was advised to call back or seek an in-person evaluation if the symptoms worsen or if the condition fails to improve as anticipated.  I provided 21 minutes  of non-face-to-face time during this encounter.   Rubye Oaks, NP

## 2020-02-11 NOTE — Patient Instructions (Addendum)
Continue on CPAP at bedtime Work on healthy weight Do not drive if sleepy Follow-up with Dr. Alva in 1 year and as needed  

## 2020-02-14 ENCOUNTER — Telehealth: Payer: Self-pay | Admitting: Adult Health

## 2020-02-14 NOTE — Telephone Encounter (Signed)
I called the patient, left her a message of appointment/restrictions.  Sent her a link for mychart as she is scheduled for a virtual visit.  Informed her that she must sign up for mychart in order to have her virtual visit.

## 2020-02-15 ENCOUNTER — Telehealth (INDEPENDENT_AMBULATORY_CARE_PROVIDER_SITE_OTHER): Payer: Medicare HMO | Admitting: Adult Health

## 2020-02-15 ENCOUNTER — Encounter: Payer: Self-pay | Admitting: Adult Health

## 2020-02-15 ENCOUNTER — Other Ambulatory Visit: Payer: Self-pay

## 2020-02-15 VITALS — BP 154/66 | HR 74 | Ht 65.0 in | Wt 240.0 lb

## 2020-02-15 DIAGNOSIS — Z713 Dietary counseling and surveillance: Secondary | ICD-10-CM | POA: Diagnosis not present

## 2020-02-15 DIAGNOSIS — Z6839 Body mass index (BMI) 39.0-39.9, adult: Secondary | ICD-10-CM | POA: Diagnosis not present

## 2020-02-15 MED ORDER — PHENTERMINE HCL 37.5 MG PO TABS: 37.5000 mg | ORAL_TABLET | Freq: Every day | ORAL | 0 refills | Status: DC

## 2020-02-15 NOTE — Progress Notes (Signed)
Patient ID: [name] [name], female   DOB: [birth year], 59 y.o.   MRN: [id number]   TELEHEALTH VIRTUAL GYNECOLOGY VISIT ENCOUNTER NOTE  I connected with [name] [name] on 02/15/20 at  3:10 PM EST by telephone at home and verified that I am speaking with the correct person using two identifiers.   I discussed the limitations, risks, security and privacy concerns of performing an evaluation and management service by telephone and the availability of in person appointments. I also discussed with the patient that there may be a patient responsible charge related to this service. The patient expressed understanding and agreed to proceed.   History:  [name] [name] is a 59 y.o. [phone] female being evaluated today for weight loss, and getting on adipex. Has gained about 25 lbs with COVID quarantine. She denies any abnormal headaches, dizziness  other concerns.   PCP is [place].      Past Medical History:  Diagnosis Date  . Abnormal Pap smear   . Adult ADHD   . Anxiety   . Arthritis    "knees" (02/10/2018)  . Bipolar disorder ([place])   . Cholelithiases    04/2015  . Depression 04/15/2013  . Ear infection 03/19/2016  . Essential hypertension, benign   . ETOH abuse    drinks wine 5 day/week  . GAD (generalized anxiety disorder)   . History of blood transfusion 1980s   "related to miscarriage"  . Incompetent cervix   . Obesity   . OSA on CPAP   . Pneumonia    "used to have it alot; nothing in years" (02/10/2018)  . PTSD (post-traumatic stress disorder)   . Thickened endometrium 05/02/2014  . Vaginal Pap smear, abnormal   . Vertigo 03/19/2016   Past Surgical History:  Procedure Laterality Date  . CERVICAL CERCLAGE    . CHOLECYSTECTOMY N/A 05/24/2015   Procedure: LAPAROSCOPIC CHOLECYSTECTOMY WITH INTRAOPERATIVE CHOLANGIOGRAM;  Surgeon: Aviva Signs Md, MD;  Location: [facility];  Service: General;  Laterality: N/A;  . DILATION AND CURETTAGE OF UTERUS    . JOINT REPLACEMENT    . KNEE ARTHROSCOPY  Right 2015  . TONSILLECTOMY    . 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 TOTAL KNEE ARTHROPLASTY Right 02/10/2018   Procedure: TOTAL KNEE ARTHROPLASTY;  Surgeon: Melrose Nakayama, MD;  Location: [place];  Service: Orthopedics;  Laterality: Right;   The following portions of the patient's history were reviewed and updated as appropriate: allergies, current medications, past family history, past medical history, past social history, past surgical history and problem list.   Health Maintenance:  Normal pap and negative HRHPV on 05/05/17. Normal mammogram on 01/15/18.  Review of Systems:  Pertinent items noted in HPI and remainder of comprehensive ROS otherwise negative.  Physical Exam:   General:  Alert, oriented and cooperative.   Mental Status: Normal mood and affect perceived. Normal judgment and thought content.  Physical exam deferred due to nature of the encounter BP (!) 154/66 (BP Location: Left Arm, Patient Position: Sitting, Cuff Size: Normal)   Pulse 74   Ht 5\' 5"  (1.651 m)   Wt 240 lb (108.9 kg)   BMI 39.94 kg/m   Labs and Imaging No results found for this or any previous visit (from the past 336 hour(s)). No results found.    Assessment and Plan:     1. Weight loss counseling, encounter for Will rx adipex Decrease portions Decrease carbs  Start walking when weather better  2. Body mass index 39.0-39.9, adult Will  rx adipex Meds ordered this encounter  Medications  . phentermine (ADIPEX-P) 37.5 MG tablet    Sig: Take 1 tablet (37.5 mg total) by mouth daily before breakfast.    Dispense:  30 tablet    Refill:  0    Order Specific Question:   Supervising Provider    Answer:   Duane Lope H [[id number]]  Follow up in 4 weeks can be telehealth        I discussed the assessment and treatment plan with the patient. The patient was provided an opportunity to ask questions and all were answered. The patient agreed with the plan and demonstrated an understanding of the  instructions.   The patient was advised to call back or seek an in-person evaluation/go to the ED if the symptoms worsen or if the condition fails to improve as anticipated.  I provided  8 minutes of non-face-to-face time during this encounter.   Cyril Mourning, NP [facility] [employer], [facility]

## 2020-03-01 ENCOUNTER — Other Ambulatory Visit: Payer: Self-pay

## 2020-03-01 ENCOUNTER — Ambulatory Visit
Admission: EM | Admit: 2020-03-01 | Discharge: 2020-03-01 | Disposition: A | Discharge status: Home / Self Care | Payer: Medicare HMO | Attending: Emergency Medicine | Admitting: Emergency Medicine

## 2020-03-01 DIAGNOSIS — B370 Candidal stomatitis: Secondary | ICD-10-CM

## 2020-03-01 MED ORDER — NYSTATIN 100000 UNIT/ML MT SUSP: 500000.0000 [IU] | Freq: Four times a day (QID) | OROMUCOSAL | 0 refills | Status: DC

## 2020-03-01 MED ORDER — TRIAMCINOLONE ACETONIDE 0.1 % MT PSTE: 1.0000 "application " | PASTE | Freq: Two times a day (BID) | OROMUCOSAL | 12 refills | Status: DC

## 2020-03-01 NOTE — ED Triage Notes (Signed)
Pt presents with white patches on tounge and difficulty swallowing

## 2020-03-01 NOTE — Discharge Instructions (Signed)
Nystatin prescribed.  Use as directed for between 10-14 days or until symptoms resolve Maintain oral hygiene Triamcinolone paste was prescribed Follow up with PCP as needed Return or go to the ED if you have any new or worsening symptoms such as fever, chills, nausea, vomiting, difficulty swallowing, fatigue, lymph node swelling, unintentional weight loss, excessive night sweats, etc..Marland Kitchen

## 2020-03-01 NOTE — ED Provider Notes (Signed)
[facility]    CSN: [id number] Arrival date & time: 03/01/20  0954      History   Chief Complaint Chief Complaint  Patient presents with  . Mouth Lesions    HPI [name] [name] is a 59 y.o. female.    [name] [name] is a 59 y.o. female who reports white spots on her tongue for the past 5 days.  Patient denies a precipitating event.  Patient  describes as white patches that are difficult to remove.  Has tried OTC medication without relief.  Symptoms made worse with swallowing with mild tongue pain, but has normal PO intake.  Denies similar symptoms in the past.  Denies fever, chills, congestion, rhinorrhea, sneezing, cough, SOB, chest pain, abdominal pain, rash, changes in bowel or bladder function.      The history is provided by the patient. No language interpreter was used.    Past Medical History:  Diagnosis Date  . Abnormal Pap smear   . Adult ADHD   . Anxiety   . Arthritis    "knees" (02/10/2018)  . Bipolar disorder ([place])   . Cholelithiases    04/2015  . Depression 04/15/2013  . Ear infection 03/19/2016  . Essential hypertension, benign   . ETOH abuse    drinks wine 5 day/week  . GAD (generalized anxiety disorder)   . History of blood transfusion 1980s   "related to miscarriage"  . Incompetent cervix   . Obesity   . OSA on CPAP   . Pneumonia    "used to have it alot; nothing in years" (02/10/2018)  . PTSD (post-traumatic stress disorder)   . Thickened endometrium 05/02/2014  . Vaginal Pap smear, abnormal   . Vertigo 03/19/2016    Patient Active Problem List   Diagnosis Date Noted  . Body mass index 39.0-39.9, adult 02/15/2020  . Weight loss counseling, encounter for 06/15/2018  . Screening for colorectal cancer 06/15/2018  . Encounter for well woman exam with routine gynecological exam 06/15/2018  . Primary localized osteoarthritis of right knee 02/10/2018  . Primary osteoarthritis of right knee 02/10/2018  . Body mass index 40.0-44.9, adult  ([place]) 05/05/2017  . Vertigo 03/19/2016  . Ear infection 03/19/2016  . Elevated LFTs 05/11/2015  . Hyperglycemia 05/11/2015  . Obesity 05/11/2015  . Tobacco abuse 05/11/2015  . Cholelithiases   . Thickened endometrium 05/02/2014  . Depression 04/15/2013  . HTN (hypertension) 04/14/2013  . Anxiety 04/14/2013  . OSA (obstructive sleep apnea) 03/20/2012  . Essential hypertension, benign 11/05/2011  . Nonspecific abnormal electrocardiogram (ECG) (EKG) 11/05/2011  . Pain in limb 11/05/2011    Past Surgical History:  Procedure Laterality Date  . CERVICAL CERCLAGE    . CHOLECYSTECTOMY N/A 05/24/2015   Procedure: LAPAROSCOPIC CHOLECYSTECTOMY WITH INTRAOPERATIVE CHOLANGIOGRAM;  Surgeon: Aviva Signs Md, MD;  Location: [facility];  Service: General;  Laterality: N/A;  . DILATION AND CURETTAGE OF UTERUS    . JOINT REPLACEMENT    . KNEE ARTHROSCOPY Right 2015  . TONSILLECTOMY    . 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 TOTAL KNEE ARTHROPLASTY Right 02/10/2018   Procedure: TOTAL KNEE ARTHROPLASTY;  Surgeon: Melrose Nakayama, MD;  Location: [place];  Service: Orthopedics;  Laterality: Right;    OB History    Gravida  4   Para  3   Term      Preterm      AB  1   Living  2     SAB  TAB      Ectopic      Multiple      Live Births               Home Medications    Prior to Admission medications   Medication Sig Start Date End Date Taking? Authorizing Provider  ALPRAZolam Prudy Feeler) 0.25 MG tablet Take 0.25 mg by mouth at bedtime as needed for sleep.    [provider]  escitalopram (LEXAPRO) 20 MG tablet TAKE 1/2 TABLET BY MOUTH DAILY 07/22/19   Cyril Mourning A, NP  hydrochlorothiazide (HYDRODIURIL) 25 MG tablet TAKE 1 TABLET(25 MG) BY MOUTH DAILY 04/22/19   Adline Potter, NP  lamoTRIgine (LAMICTAL) 200 MG tablet Take 100-200 mg by mouth See admin instructions. Take 100 mg in the morning and 200 mg in the evening 12/19/14   [provider]    lisinopril (ZESTRIL) 10 MG tablet TAKE 1 TABLET BY MOUTH DAILY 12/20/19   Cyril Mourning A, NP  nystatin (MYCOSTATIN) 100000 UNIT/ML suspension Take 5 mLs (500,000 Units total) by mouth 4 (four) times daily. 03/01/20   Naamah Boggess, Zachery Dakins, FNP  phentermine (ADIPEX-P) 37.5 MG tablet Take 1 tablet (37.5 mg total) by mouth daily before breakfast. 02/15/20   Adline Potter, NP  tiZANidine (ZANAFLEX) 4 MG tablet Take 4 mg by mouth every 6 (six) hours as needed. 11/22/19   [provider]  triamcinolone (KENALOG) 0.1 % paste Use as directed 1 application in the mouth or throat 2 (two) times daily. 03/01/20   AvegnoZachery Dakins, FNP    Family History Family History  Problem Relation Age of Onset  . Hypertension Father   . Cancer Father   . Hypertension Mother   . Pancreatic cancer Mother   . Asthma Mother   . Sleep apnea Mother   . Cancer Mother        pancreas  . Coronary artery disease Maternal Grandfather   . Sleep apnea Brother   . Stomach cancer Paternal Aunt   . Stomach cancer Paternal Uncle   . Cancer Maternal Aunt        lung  . Stroke Maternal Aunt   . Heart attack Maternal Aunt   . Cancer Maternal Aunt   . Diabetes Other   . Cancer Maternal Aunt        ovarian cancer  . Colon cancer Neg Hx   . Esophageal cancer Neg Hx   . Rectal cancer Neg Hx     Social History Social History   Tobacco Use  . Smoking status: Former Smoker    Packs/day: 1.00    Years: 25.00    Pack years: 25.00    Types: Cigarettes    Quit date: 12/30/2005    Years since quitting: 14.1  . Smokeless tobacco: Never Used  Substance Use Topics  . Alcohol use: Yes    Alcohol/week: 7.0 standard drinks    Types: 5 Glasses of wine, 2 Standard drinks or equivalent per week    Comment: occ  . Drug use: No     Allergies   Patient has no known allergies.   Review of Systems Review of Systems  Constitutional: Negative.   HENT:       White patches on tongue Tongue pain  Respiratory:  Negative.   Cardiovascular: Negative.   All other systems reviewed and are negative.    Physical Exam Triage Vital Signs ED Triage Vitals [03/01/20 1003]  Enc Vitals Group     BP  Pulse      Resp      Temp      Temp src      SpO2      Weight      Height      Head Circumference      Peak Flow      Pain Score 7     Pain Loc      Pain Edu?      Excl. in GC?    No data found.  Updated Vital Signs BP (!) 156/83   Pulse 81   Temp 97.9 F (36.6 C)   Resp 20   SpO2 97%   Visual Acuity Right Eye Distance:   Left Eye Distance:   Bilateral Distance:    Right Eye Near:   Left Eye Near:    Bilateral Near:     Physical Exam Vitals and nursing note reviewed.  Constitutional:      General: She is not in acute distress.    Appearance: Normal appearance. She is normal weight. She is not ill-appearing, toxic-appearing or diaphoretic.  HENT:     Nose: Nose normal. No congestion.     Mouth/Throat:     Lips: Pink. No lesions.     Mouth: Mucous membranes are moist.     Dentition: Normal dentition. No dental caries.     Tongue: Lesions present. Tongue does not deviate from midline.     Pharynx: Oropharynx is clear. No oropharyngeal exudate or posterior oropharyngeal erythema.     Comments: White patches present Cardiovascular:     Rate and Rhythm: Normal rate and regular rhythm.     Pulses: Normal pulses.     Heart sounds: Normal heart sounds. No murmur. No gallop.   Pulmonary:     Effort: Pulmonary effort is normal. No respiratory distress.     Breath sounds: Normal breath sounds. No stridor. No wheezing, rhonchi or rales.  Chest:     Chest wall: No tenderness.  Neurological:     Mental Status: She is alert.      UC Treatments / Results  Labs (all labs ordered are listed, but only abnormal results are displayed) Labs Reviewed - No data to display  EKG   Radiology No results found.  Procedures Procedures (including critical care time)  Medications  Ordered in UC Medications - No data to display  Initial Impression / Assessment and Plan / UC Course  I have reviewed the triage vital signs and the nursing notes.  Pertinent labs & imaging results that were available during my care of the patient were reviewed by me and considered in my medical decision making (see chart for details).   Patient is stable for discharge. Oral triamcinolone paste was prescribed Nystatin prescribed Advised patient to follow with primary care Return for worsening of symptoms   Final Clinical Impressions(s) / UC Diagnoses   Final diagnoses:  Thrush, oral     Discharge Instructions     Nystatin prescribed.  Use as directed for between 10-14 days or until symptoms resolve Maintain oral hygiene Triamcinolone paste was prescribed Follow up with PCP as needed Return or go to the ED if you have any new or worsening symptoms such as fever, chills, nausea, vomiting, difficulty swallowing, fatigue, lymph node swelling, unintentional weight loss, excessive night sweats, etc...     ED Prescriptions    Medication Sig Dispense Auth. Provider   nystatin (MYCOSTATIN) 100000 UNIT/ML suspension Take 5 mLs (500,000 Units total) by mouth 4 (  four) times daily. 473 mL Traveon Louro S, FNP   triamcinolone (KENALOG) 0.1 % paste Use as directed 1 application in the mouth or throat 2 (two) times daily. 5 g Durward Parcel, FNP     PDMP not reviewed this encounter.   Durward Parcel, FNP 03/01/20 1025

## 2020-03-15 ENCOUNTER — Telehealth: Payer: Medicare HMO | Admitting: Adult Health

## 2020-03-16 ENCOUNTER — Encounter: Payer: Self-pay | Admitting: Adult Health

## 2020-03-16 ENCOUNTER — Telehealth (INDEPENDENT_AMBULATORY_CARE_PROVIDER_SITE_OTHER): Payer: Medicare HMO | Admitting: Adult Health

## 2020-03-16 ENCOUNTER — Other Ambulatory Visit: Payer: Self-pay

## 2020-03-16 VITALS — BP 152/67 | HR 76 | Ht 65.5 in | Wt 233.4 lb

## 2020-03-16 DIAGNOSIS — Z713 Dietary counseling and surveillance: Secondary | ICD-10-CM

## 2020-03-16 DIAGNOSIS — Z6838 Body mass index (BMI) 38.0-38.9, adult: Secondary | ICD-10-CM

## 2020-03-16 MED ORDER — PHENTERMINE HCL 37.5 MG PO TABS: 37.5000 mg | ORAL_TABLET | Freq: Every day | ORAL | 0 refills | Status: DC

## 2020-03-16 NOTE — Progress Notes (Signed)
Patient ID: SETH FRIEDLANDER, female   DOB: [birth year], 59 y.o.   MRN: [id number]   TELEHEALTH VIRTUAL GYNECOLOGY VISIT ENCOUNTER NOTE  I connected with [name] [name] on 03/16/20 at  4:10 PM EDT by telephone at home and verified that I am speaking with the correct person using two identifiers.   I discussed the limitations, risks, security and privacy concerns of performing an evaluation and management service by telephone and the availability of in person appointments. I also discussed with the patient that there may be a patient responsible charge related to this service. The patient expressed understanding and agreed to proceed.   History:  [name] [name] is a 59 y.o. [phone] female being evaluated today for weight and she has lost 6.5 lbs on adipex. She denies any headaches, dizziness, problems sleeping or other concerns.       Past Medical History:  Diagnosis Date  . Abnormal Pap smear   . Adult ADHD   . Anxiety   . Arthritis    "knees" (02/10/2018)  . Bipolar disorder ([facility])   . Cholelithiases    04/2015  . Depression 04/15/2013  . Ear infection 03/19/2016  . Essential hypertension, benign   . ETOH abuse    drinks wine 5 day/week  . GAD (generalized anxiety disorder)   . History of blood transfusion 1980s   "related to miscarriage"  . Incompetent cervix   . Obesity   . OSA on CPAP   . Pneumonia    "used to have it alot; nothing in years" (02/10/2018)  . PTSD (post-traumatic stress disorder)   . Thickened endometrium 05/02/2014  . Vaginal Pap smear, abnormal   . Vertigo 03/19/2016   Past Surgical History:  Procedure Laterality Date  . CERVICAL CERCLAGE    . CHOLECYSTECTOMY N/A 05/24/2015   Procedure: LAPAROSCOPIC CHOLECYSTECTOMY WITH INTRAOPERATIVE CHOLANGIOGRAM;  Surgeon: Franky Macho Md, MD;  Location: [facility];  Service: General;  Laterality: N/A;  . DILATION AND CURETTAGE OF UTERUS    . JOINT REPLACEMENT    . KNEE ARTHROSCOPY Right 2015  . TONSILLECTOMY    . TOTAL KNEE  ARTHROPLASTY Right 02/10/2018  . TOTAL KNEE ARTHROPLASTY Right 02/10/2018   Procedure: TOTAL KNEE ARTHROPLASTY;  Surgeon: Marcene Corning, MD;  Location[facility];  Service: Orthopedics;  Laterality: Right;   The following portions of the patient's history were reviewed and updated as appropriate: allergies, current medications, past family history, past medical history, past social history, past surgical history and problem list.   Health Maintenance:  Normal pap and negative HRHPV on 05/05/17.   Normal mammogram on 01/15/18  Review of Systems:  Pertinent items noted in HPI and remainder of comprehensive ROS otherwise negative.  Physical Exam:   General:  Alert, oriented and cooperative.   Mental Status: Normal mood and affect perceived. Normal judgment and thought content.  Physical exam deferred due to nature of the encounter BP (!) 152/67   Pulse 76   Ht 5' 5.5" (1.664 m)   Wt 233 lb 6.4 oz (105.9 kg)   BMI 38.25 kg/m  Labs and Imaging No results found for this or any previous visit (from the past 336 hour(s)). No results found.    Assessment and Plan:     1. Weight loss counseling, encounter for Meds ordered this encounter  Medications  . phentermine (ADIPEX-P) 37.5 MG tablet    Sig: Take 1 tablet (37.5 mg total) by mouth daily before breakfast.    Dispense:  30 tablet  Refill:  0    Order Specific Question:   Supervising Provider    Answer:   Elonda Husky, LUTHER H [[id number]]    2. Body mass index 38.0-38.9, adult Follow up in 4 weeks with telehealth        I discussed the assessment and treatment plan with the patient. The patient was provided an opportunity to ask questions and all were answered. The patient agreed with the plan and demonstrated an understanding of the instructions.   The patient was advised to call back or seek an in-person evaluation/go to the ED if the symptoms worsen or if the condition fails to improve as anticipated.  I provided  7 minutes of  non-face-to-face time during this encounter.   Derrek Monaco, NP Center for [employer], [place]

## 2020-03-17 ENCOUNTER — Ambulatory Visit: Payer: Medicare HMO | Attending: Internal Medicine

## 2020-03-17 DIAGNOSIS — Z23 Encounter for immunization: Secondary | ICD-10-CM

## 2020-03-17 NOTE — Progress Notes (Signed)
   Covid-19 Vaccination Clinic  Name:  ARACELYS GLADE    MRN: [id number] DOB: 03-14-1961  03/17/2020  Ms. Kuwahara was observed post Covid-19 immunization for 15 minutes without incident. She was provided with Vaccine Information Sheet and instruction to access the V-Safe system.   Ms. Kohn was instructed to call 911 with any severe reactions post vaccine: Marland Kitchen Difficulty breathing  . Swelling of face and throat  . A fast heartbeat  . A bad rash all over body  . Dizziness and weakness   Immunizations Administered    Name Date Dose VIS Date Route   Moderna COVID-19 Vaccine 03/17/2020  8:39 AM 0.5 mL 11/30/2019 Intramuscular   Manufacturer: Moderna   Lot: [id number]   NDC: 85694-370-05

## 2020-04-13 ENCOUNTER — Telehealth: Payer: Medicare HMO | Admitting: Adult Health

## 2020-04-19 ENCOUNTER — Ambulatory Visit: Payer: Medicare HMO | Attending: Internal Medicine

## 2020-04-19 DIAGNOSIS — Z23 Encounter for immunization: Secondary | ICD-10-CM

## 2020-04-19 NOTE — Progress Notes (Signed)
   Covid-19 Vaccination Clinic  Name:  [name] [name]    MRN: [id number] DOB: 06/17/1961  04/19/2020  [name] was observed post Covid-19 immunization for 15 minutes without incident. She was provided with Vaccine Information Sheet and instruction to access the V-Safe system.   [name] was instructed to call 911 with any severe reactions post vaccine: Marland Kitchen Difficulty breathing  . Swelling of face and throat  . A fast heartbeat  . A bad rash all over body  . Dizziness and weakness   Immunizations Administered    Name Date Dose VIS Date Route   Moderna COVID-19 Vaccine 04/19/2020  8:11 AM 0.5 mL 11/2019 Intramuscular   Manufacturer: Moderna   Lot: [id number]   NDC: 42103-128-11

## 2020-04-20 ENCOUNTER — Telehealth: Payer: Medicare HMO | Admitting: Adult Health

## 2020-04-25 ENCOUNTER — Other Ambulatory Visit: Payer: Self-pay

## 2020-04-25 ENCOUNTER — Encounter: Payer: Self-pay | Admitting: Adult Health

## 2020-04-25 ENCOUNTER — Telehealth (INDEPENDENT_AMBULATORY_CARE_PROVIDER_SITE_OTHER): Payer: Medicare HMO | Admitting: Adult Health

## 2020-04-25 VITALS — BP 168/68 | Ht 65.0 in | Wt 233.0 lb

## 2020-04-25 DIAGNOSIS — Z713 Dietary counseling and surveillance: Secondary | ICD-10-CM | POA: Diagnosis not present

## 2020-04-25 DIAGNOSIS — Z6838 Body mass index (BMI) 38.0-38.9, adult: Secondary | ICD-10-CM | POA: Diagnosis not present

## 2020-04-25 MED ORDER — PHENTERMINE HCL 37.5 MG PO TABS: 37.5000 mg | ORAL_TABLET | Freq: Every day | ORAL | 0 refills | Status: DC

## 2020-04-25 NOTE — Progress Notes (Signed)
Patient ID: [name] [name], female   DOB: 10/28/1961, 59 y.o.   MRN: [id number]   TELEHEALTH VIRTUAL GYNECOLOGY VISIT ENCOUNTER NOTE  I connected with Delfina Redwood on 04/25/20 at 10:50 AM EDT by telephone at home and verified that I am speaking with the correct person using two identifiers.   I discussed the limitations, risks, security and privacy concerns of performing an evaluation and management service by telephone and the availability of in person appointments. I also discussed with the patient that there may be a patient responsible charge related to this service. The patient expressed understanding and agreed to proceed.   History:  [name] [name] is a 59 y.o. [phone] female being evaluated today for weight and BP.  She knows she ate over [employer].She denies any unusual headaches or other concerns.       Past Medical History:  Diagnosis Date  . Abnormal Pap smear   . Adult ADHD   . Anxiety   . Arthritis    "knees" (02/10/2018)  . Bipolar disorder ([place])   . Cholelithiases    04/2015  . Depression 04/15/2013  . Ear infection 03/19/2016  . Essential hypertension, benign   . ETOH abuse    drinks wine 5 day/week  . GAD (generalized anxiety disorder)   . History of blood transfusion 1980s   "related to miscarriage"  . Incompetent cervix   . Obesity   . OSA on CPAP   . Pneumonia    "used to have it alot; nothing in years" (02/10/2018)  . PTSD (post-traumatic stress disorder)   . Thickened endometrium 05/02/2014  . Vaginal Pap smear, abnormal   . Vertigo 03/19/2016   Past Surgical History:  Procedure Laterality Date  . CERVICAL CERCLAGE    . CHOLECYSTECTOMY N/A 05/24/2015   Procedure: LAPAROSCOPIC CHOLECYSTECTOMY WITH INTRAOPERATIVE CHOLANGIOGRAM;  Surgeon: Aviva Signs Md, MD;  Location: [facility];  Service: General;  Laterality: N/A;  . DILATION AND CURETTAGE OF UTERUS    . JOINT REPLACEMENT    . KNEE ARTHROSCOPY Right 2015  . TONSILLECTOMY    . TOTAL KNEE ARTHROPLASTY Right  02/10/2018  . TOTAL KNEE ARTHROPLASTY Right 02/10/2018   Procedure: TOTAL KNEE ARTHROPLASTY;  Surgeon: Melrose Nakayama, MD;  Location: [place];  Service: Orthopedics;  Laterality: Right;   The following portions of the patient's history were reviewed and updated as appropriate: allergies, current medications, past family history, past medical history, past social history, past surgical history and problem list.   Health Maintenance:  Normal pap and negative HRHPV on 05/05/17.  Normal mammogram on 01/15/18  Review of Systems:  Pertinent items noted in HPI and remainder of comprehensive ROS otherwise negative.  Physical Exam:   General:  Alert, oriented and cooperative.   Mental Status: Normal mood and affect perceived. Normal judgment and thought content.  Physical exam deferred due to nature of the encounter BP (!) 168/68 (BP Location: Left Arm, Patient Position: Sitting, Cuff Size: Normal)   Ht 5\' 5"  (1.651 m)   Wt 233 lb (105.7 kg)   BMI 38.77 kg/m  Lost 1/2 lbs Labs and Imaging No results found for this or any previous visit (from the past 336 hour(s)). No results found.    Assessment and Plan:      1. Weight loss counseling, encounter for Increase water  Increase activity to 150 minutes per week Decrease carbs Will rx adipex, last time for now Meds ordered this encounter  Medications  . phentermine (ADIPEX-P) 37.5 MG tablet  Sig: Take 1 tablet (37.5 mg total) by mouth daily before breakfast.    Dispense:  30 tablet    Refill:  0    Order Specific Question:   Supervising Provider    Answer:   Duane Lope H [[id number]]   Follow up in 4 weeks for pap and physical and fasting labs   2. Body mass index 38.0-38.9, adult       I discussed the assessment and treatment plan with the patient. The patient was provided an opportunity to ask questions and all were answered. The patient agreed with the plan and demonstrated an understanding of the instructions.   The patient was advised  to call back or seek an in-person evaluation/go to the ED if the symptoms worsen or if the condition fails to improve as anticipated.  I provided  5 minutes of non-face-to-face time during this encounter.   Cyril Mourning, NP Center for [employer], [facility]

## 2020-05-16 ENCOUNTER — Other Ambulatory Visit: Payer: Self-pay | Admitting: Adult Health

## 2020-05-16 ENCOUNTER — Other Ambulatory Visit: Payer: Medicare HMO

## 2020-05-17 LAB — COMPREHENSIVE METABOLIC PANEL
ALT: 17 IU/L (ref 0–32)
AST: 22 IU/L (ref 0–40)
Albumin/Globulin Ratio: 1.8 (ref 1.2–2.2)
Albumin: 4.9 g/dL (ref 3.8–4.9)
Alkaline Phosphatase: 150 IU/L — ABNORMAL HIGH (ref 48–121)
BUN/Creatinine Ratio: 20 (ref 9–23)
BUN: 30 mg/dL — ABNORMAL HIGH (ref 6–24)
Bilirubin Total: 0.5 mg/dL (ref 0.0–1.2)
CO2: 21 mmol/L (ref 20–29)
Calcium: 10 mg/dL (ref 8.7–10.2)
Chloride: 99 mmol/L (ref 96–106)
Creatinine, Ser: 1.47 mg/dL — ABNORMAL HIGH (ref 0.57–1.00)
GFR calc Af Amer: 45 mL/min/{1.73_m2} — ABNORMAL LOW (ref >59)
GFR calc non Af Amer: 39 mL/min/{1.73_m2} — ABNORMAL LOW (ref >59)
Globulin, Total: 2.7 g/dL (ref 1.5–4.5)
Glucose: 92 mg/dL (ref 65–99)
Potassium: 4.9 mmol/L (ref 3.5–5.2)
Sodium: 139 mmol/L (ref 134–144)
Total Protein: 7.6 g/dL (ref 6.0–8.5)

## 2020-05-17 LAB — CBC
Hematocrit: 40.7 % (ref 34.0–46.6)
Hemoglobin: 13.7 g/dL (ref 11.1–15.9)
MCH: 31.6 pg (ref 26.6–33.0)
MCHC: 33.7 g/dL (ref 31.5–35.7)
MCV: 94 fL (ref 79–97)
Platelets: 352 10*3/uL (ref 150–450)
RBC: 4.33 x10E6/uL (ref 3.77–5.28)
RDW: 12.5 % (ref 11.7–15.4)
WBC: 6.5 10*3/uL (ref 3.4–10.8)

## 2020-05-17 LAB — LIPID PANEL
Chol/HDL Ratio: 5.2 ratio — ABNORMAL HIGH (ref 0.0–4.4)
Cholesterol, Total: 263 mg/dL — ABNORMAL HIGH (ref 100–199)
HDL: 51 mg/dL (ref >39)
LDL Chol Calc (NIH): 178 mg/dL — ABNORMAL HIGH (ref 0–99)
Triglycerides: 185 mg/dL — ABNORMAL HIGH (ref 0–149)
VLDL Cholesterol Cal: 34 mg/dL (ref 5–40)

## 2020-05-17 LAB — HEMOGLOBIN A1C
Est. average glucose Bld gHb Est-mCnc: 117 mg/dL
Hgb A1c MFr Bld: 5.7 % — ABNORMAL HIGH (ref 4.8–5.6)

## 2020-05-17 LAB — TSH: TSH: 4.73 u[IU]/mL — ABNORMAL HIGH (ref 0.450–4.500)

## 2020-05-17 LAB — VITAMIN D 25 HYDROXY (VIT D DEFICIENCY, FRACTURES): Vit D, 25-Hydroxy: 19.7 ng/mL — ABNORMAL LOW (ref 30.0–100.0)

## 2020-05-25 ENCOUNTER — Other Ambulatory Visit: Payer: Medicare HMO | Admitting: Adult Health

## 2020-05-31 ENCOUNTER — Telehealth: Payer: Self-pay | Admitting: Adult Health

## 2020-05-31 ENCOUNTER — Encounter: Payer: Self-pay | Admitting: Adult Health

## 2020-05-31 DIAGNOSIS — R7989 Other specified abnormal findings of blood chemistry: Secondary | ICD-10-CM

## 2020-05-31 NOTE — Telephone Encounter (Signed)
Pt aware of labs and need to recheck CMP and TSH, can take vitamin D 5000 IU daily, decrease carbs and fats, has physical in July  Will talk when repeat labs back

## 2020-05-31 NOTE — Telephone Encounter (Signed)
Left message to call me about labs, several are abnormal, I know you rescheduled  physical appointment

## 2020-06-01 ENCOUNTER — Telehealth: Payer: Self-pay | Admitting: Adult Health

## 2020-06-01 NOTE — Telephone Encounter (Signed)
Pt would like for Victorino Dike to give her a call

## 2020-06-01 NOTE — Telephone Encounter (Signed)
She could not find D3 5000 IU so got 2000 IU and 1000 IU that is fne

## 2020-06-10 LAB — COMPREHENSIVE METABOLIC PANEL
ALT: 13 IU/L (ref 0–32)
AST: 14 IU/L (ref 0–40)
Albumin/Globulin Ratio: 2 (ref 1.2–2.2)
Albumin: 4.5 g/dL (ref 3.8–4.9)
Alkaline Phosphatase: 128 IU/L — ABNORMAL HIGH (ref 48–121)
BUN/Creatinine Ratio: 19 (ref 9–23)
BUN: 23 mg/dL (ref 6–24)
Bilirubin Total: 0.2 mg/dL (ref 0.0–1.2)
CO2: 24 mmol/L (ref 20–29)
Calcium: 10.1 mg/dL (ref 8.7–10.2)
Chloride: 98 mmol/L (ref 96–106)
Creatinine, Ser: 1.2 mg/dL — ABNORMAL HIGH (ref 0.57–1.00)
GFR calc Af Amer: 58 mL/min/{1.73_m2} — ABNORMAL LOW (ref >59)
GFR calc non Af Amer: 50 mL/min/{1.73_m2} — ABNORMAL LOW (ref >59)
Globulin, Total: 2.3 g/dL (ref 1.5–4.5)
Glucose: 98 mg/dL (ref 65–99)
Potassium: 5.3 mmol/L — ABNORMAL HIGH (ref 3.5–5.2)
Sodium: 137 mmol/L (ref 134–144)
Total Protein: 6.8 g/dL (ref 6.0–8.5)

## 2020-06-10 LAB — TSH: TSH: 4.24 u[IU]/mL (ref 0.450–4.500)

## 2020-07-06 ENCOUNTER — Other Ambulatory Visit (HOSPITAL_COMMUNITY)
Admission: RE | Admit: 2020-07-06 | Discharge: 2020-07-06 | Disposition: A | Discharge status: Home / Self Care | Payer: Medicare HMO | Source: Ambulatory Visit | Attending: Adult Health | Admitting: Adult Health

## 2020-07-06 ENCOUNTER — Ambulatory Visit (INDEPENDENT_AMBULATORY_CARE_PROVIDER_SITE_OTHER): Payer: Medicare HMO | Admitting: Adult Health

## 2020-07-06 ENCOUNTER — Encounter: Payer: Self-pay | Admitting: Adult Health

## 2020-07-06 VITALS — BP 140/76 | HR 77 | Ht 66.0 in | Wt 228.0 lb

## 2020-07-06 DIAGNOSIS — R7989 Other specified abnormal findings of blood chemistry: Secondary | ICD-10-CM | POA: Diagnosis not present

## 2020-07-06 DIAGNOSIS — Z01419 Encounter for gynecological examination (general) (routine) without abnormal findings: Secondary | ICD-10-CM

## 2020-07-06 DIAGNOSIS — K649 Unspecified hemorrhoids: Secondary | ICD-10-CM

## 2020-07-06 DIAGNOSIS — I10 Essential (primary) hypertension: Secondary | ICD-10-CM | POA: Diagnosis not present

## 2020-07-06 DIAGNOSIS — Z1151 Encounter for screening for human papillomavirus (HPV): Secondary | ICD-10-CM | POA: Insufficient documentation

## 2020-07-06 DIAGNOSIS — Z6836 Body mass index (BMI) 36.0-36.9, adult: Secondary | ICD-10-CM

## 2020-07-06 DIAGNOSIS — Z1211 Encounter for screening for malignant neoplasm of colon: Secondary | ICD-10-CM

## 2020-07-06 DIAGNOSIS — Z713 Dietary counseling and surveillance: Secondary | ICD-10-CM

## 2020-07-06 LAB — HEMOCCULT GUIAC POC 1CARD (OFFICE): Fecal Occult Blood, POC: NEGATIVE

## 2020-07-06 LAB — POCT URINALYSIS DIPSTICK
Blood, UA: NEGATIVE
Glucose, UA: NEGATIVE
Leukocytes, UA: NEGATIVE
Nitrite, UA: NEGATIVE
Protein, UA: NEGATIVE

## 2020-07-06 MED ORDER — LISINOPRIL 10 MG PO TABS: ORAL_TABLET | ORAL | 3 refills | Status: DC

## 2020-07-06 MED ORDER — PHENTERMINE HCL 37.5 MG PO TABS: 37.5000 mg | ORAL_TABLET | Freq: Every day | ORAL | 0 refills | Status: DC

## 2020-07-06 MED ORDER — HYDROCHLOROTHIAZIDE 25 MG PO TABS: ORAL_TABLET | ORAL | 3 refills | Status: DC

## 2020-07-06 NOTE — Progress Notes (Signed)
Patient ID: [name] [name], female   DOB: Oct 07, 1961, 59 y.o.   MRN: [id number] History of Present Illness:  [name] [name] is a 59 year old white female, married, PM, in for well woman gyn exam and pap. PCP is [place].   Current Medications, Allergies, Past Medical History, Past Surgical History, Family History and Social History were reviewed in [employer] record.     Review of Systems:  Patient denies any headaches, hearing loss, fatigue, blurred vision, shortness of breath, chest pain, abdominal pain, problems with bowel movements, urination, or intercourse. No joint pain or mood swings.   Physical Exam:BP 140/76 (BP Location: Right Arm, Patient Position: Sitting, Cuff Size: Large)   Pulse 77   Ht 5\' 6"  (1.676 m)   Wt 228 lb (103.4 kg)   BMI 36.80 kg/m urine dipstick negative. General:  Well developed, well nourished, no acute distress Skin:  Warm and dry Neck:  Midline trachea, normal thyroid, good ROM, no lymphadenopathy Lungs; Clear to auscultation bilaterally Breast:  No dominant palpable mass, retraction, or nipple discharge Cardiovascular: Regular rate and rhythm Abdomen:  Soft, non tender, no hepatosplenomegaly Pelvic:  External genitalia is normal in appearance, no lesions.  The vagina is normal in appearance. Urethra has no lesions or masses. The cervix is bulbous.pap with high risk HPV 16/18 genotyping performed.  Uterus is felt to be normal size, shape, and contour.  No adnexal masses or tenderness noted.Bladder is non tender, no masses felt. Rectal: Good sphincter tone, no polyps, + hemorrhoids felt,has external too.  Hemoccult negative. Extremities/musculoskeletal:  No swelling or varicosities noted, no clubbing or cyanosis Psych:  No mood changes, alert and cooperative,seems happy AA is 3 Fall risk is low PHQ 9 score is 8, no SI is on meds and sees NP  Examination chaperoned by Deatra Robinson LPN Has lost 12 lbs since February.    Impression and Plan: 1. Encounter for gynecological examination with Papanicolaou smear of cervix Pap sent Physical in 1 year Pap in 3 if normal Mammogram now See PCP for Tdap  2. Elevated serum creatinine Check CMP Urine sent for UA  3. Body mass index 36.0-36.9, adult Will refill adipex Meds ordered this encounter  Medications  . phentermine (ADIPEX-P) 37.5 MG tablet    Sig: Take 1 tablet (37.5 mg total) by mouth daily before breakfast.    Dispense:  30 tablet    Refill:  0    Order Specific Question:   Supervising Provider    Answer:   12-17-1968, LUTHER H [[id number]]  . lisinopril (ZESTRIL) 10 MG tablet    Sig: TAKE 1 TABLET BY MOUTH DAILY    Dispense:  90 tablet    Refill:  3    Order Specific Question:   Supervising Provider    Answer:   Despina Hidden, LUTHER H [[id number]]  . hydrochlorothiazide (HYDRODIURIL) 25 MG tablet    Sig: TAKE 1 TABLET(25 MG) BY MOUTH DAILY    Dispense:  90 tablet    Refill:  3    Order Specific Question:   Supervising Provider    Answer:   Despina Hidden H [[id number]]  follow up with tele visit in 4 weeks   4. Weight loss counseling, encounter for Increase water   5. Encounter for screening fecal occult blood testing Colonoscopy per GI, she says 3024  6. Hemorrhoids, unspecified hemorrhoid type   7. Hypertension, unspecified type Continue BP meds, refilled today

## 2020-07-07 LAB — URINALYSIS, ROUTINE W REFLEX MICROSCOPIC
Bilirubin, UA: NEGATIVE
Glucose, UA: NEGATIVE
Ketones, UA: NEGATIVE
Leukocytes,UA: NEGATIVE
Nitrite, UA: NEGATIVE
Protein,UA: NEGATIVE
RBC, UA: NEGATIVE
Specific Gravity, UA: 1.022 (ref 1.005–1.030)
Urobilinogen, Ur: 0.2 mg/dL (ref 0.2–1.0)
pH, UA: 6 (ref 5.0–7.5)

## 2020-07-07 LAB — CYTOLOGY - PAP
Comment: NEGATIVE
Diagnosis: NEGATIVE
High risk HPV: NEGATIVE

## 2020-07-14 LAB — COMPREHENSIVE METABOLIC PANEL WITH GFR
ALT: 19 IU/L (ref 0–32)
AST: 23 IU/L (ref 0–40)
Albumin/Globulin Ratio: 2 (ref 1.2–2.2)
Albumin: 4.9 g/dL (ref 3.8–4.9)
Alkaline Phosphatase: 125 IU/L — ABNORMAL HIGH (ref 48–121)
BUN/Creatinine Ratio: 15 (ref 9–23)
BUN: 21 mg/dL (ref 6–24)
Bilirubin Total: <0.2 mg/dL (ref 0.0–1.2)
CO2: 23 mmol/L (ref 20–29)
Calcium: 10 mg/dL (ref 8.7–10.2)
Chloride: 101 mmol/L (ref 96–106)
Creatinine, Ser: 1.41 mg/dL — ABNORMAL HIGH (ref 0.57–1.00)
GFR calc Af Amer: 47 mL/min/1.73 — ABNORMAL LOW
GFR calc non Af Amer: 41 mL/min/1.73 — ABNORMAL LOW
Globulin, Total: 2.4 g/dL (ref 1.5–4.5)
Glucose: 99 mg/dL (ref 65–99)
Potassium: 4.6 mmol/L (ref 3.5–5.2)
Sodium: 140 mmol/L (ref 134–144)
Total Protein: 7.3 g/dL (ref 6.0–8.5)

## 2020-07-18 ENCOUNTER — Telehealth: Payer: Self-pay | Admitting: Adult Health

## 2020-07-18 NOTE — Telephone Encounter (Signed)
Spoke with Liborio Nixon a [facility] office, referred [name] [name] for consult and evaluation for elevated creatinine

## 2020-07-18 NOTE — Telephone Encounter (Signed)
Pt has appt with Dr Wolfgang Phoenix in August

## 2020-07-18 NOTE — Telephone Encounter (Signed)
Pt would like call from [facility]

## 2020-08-03 ENCOUNTER — Telehealth: Payer: Medicare HMO | Admitting: Adult Health

## 2020-08-11 ENCOUNTER — Telehealth (INDEPENDENT_AMBULATORY_CARE_PROVIDER_SITE_OTHER): Payer: Medicare HMO | Admitting: Adult Health

## 2020-08-11 ENCOUNTER — Other Ambulatory Visit: Payer: Self-pay

## 2020-08-11 ENCOUNTER — Encounter: Payer: Self-pay | Admitting: Adult Health

## 2020-08-11 VITALS — BP 152/65 | Ht 65.0 in | Wt 216.4 lb

## 2020-08-11 DIAGNOSIS — Z713 Dietary counseling and surveillance: Secondary | ICD-10-CM | POA: Diagnosis not present

## 2020-08-11 DIAGNOSIS — Z6836 Body mass index (BMI) 36.0-36.9, adult: Secondary | ICD-10-CM

## 2020-08-11 MED ORDER — PHENTERMINE HCL 37.5 MG PO TABS: 37.5000 mg | ORAL_TABLET | Freq: Every day | ORAL | 0 refills | Status: DC

## 2020-08-11 NOTE — Progress Notes (Signed)
Patient ID: [name] [name], female   DOB: 03-03-1961, 59 y.o.   MRN: [id number]   TELEHEALTH GYNECOLOGY VISIT ENCOUNTER NOTE  I connected with [name] [name] on 08/11/20 at  8:50 AM EDT by telephone at home and verified that I am speaking with the correct person using two identifiers.   I discussed the limitations, risks, security and privacy concerns of performing an evaluation and management service by telephone and the availability of in person appointments. I also discussed with the patient that there may be a patient responsible charge related to this service. The patient expressed understanding and agreed to proceed.   History:  [name] [name] is a 59 y.o. [phone] female being evaluated today for weight loss, is on adipex. She says she has really been trying. She denies any headaches or sleep issues or other concerns.       Past Medical History:  Diagnosis Date   Abnormal Pap smear    Adult ADHD    Anxiety    Arthritis    "knees" (02/10/2018)   Bipolar disorder (HCC)    Cholelithiases    04/2015   Depression 04/15/2013   Ear infection 03/19/2016   Essential hypertension, benign    ETOH abuse    drinks wine 5 day/week   GAD (generalized anxiety disorder)    History of blood transfusion 1980s   "related to miscarriage"   Incompetent cervix    Obesity    OSA on CPAP    Pneumonia    "used to have it alot; nothing in years" (02/10/2018)   PTSD (post-traumatic stress disorder)    Thickened endometrium 05/02/2014   Vaginal Pap smear, abnormal    Vertigo 03/19/2016   Past Surgical History:  Procedure Laterality Date   CERVICAL CERCLAGE     CHOLECYSTECTOMY N/A 05/24/2015   Procedure: LAPAROSCOPIC CHOLECYSTECTOMY WITH INTRAOPERATIVE CHOLANGIOGRAM;  Surgeon: Franky Macho Md, MD;  Location: [facility];  Service: General;  Laterality: N/A;   DILATION AND CURETTAGE OF UTERUS     JOINT REPLACEMENT     KNEE ARTHROSCOPY Right 2015   TONSILLECTOMY     TOTAL KNEE  ARTHROPLASTY Right 02/10/2018   TOTAL KNEE ARTHROPLASTY Right 02/10/2018   Procedure: TOTAL KNEE ARTHROPLASTY;  Surgeon: Marcene Corning, MD;  Location: [facility];  Service: Orthopedics;  Laterality: Right;   The following portions of the patient's history were reviewed and updated as appropriate: allergies, current medications, past family history, past medical history, past social history, past surgical history and problem list.   Health Maintenance:  Normal pap and negative HRHPV on 07/06/20.  Normal mammogram on 01/15/18  Review of Systems:  Pertinent items noted in HPI and remainder of comprehensive ROS otherwise negative.  Physical Exam:   General:  Alert, oriented and cooperative.   Mental Status: Normal mood and affect perceived. Normal judgment and thought content.  Physical exam deferred due to nature of the encounter BP (!) 152/65    Ht 5\' 5"  (1.651 m)    Wt 216 lb 6.4 oz (98.2 kg)    BMI 36.01 kg/m   She lost almost 12 lbs this month.  Labs and Imaging No results found for this or any previous visit (from the past 336 hour(s)). No results found.    Assessment and Plan:     1. Weight loss counseling, encounter for Continue adipex Increase water  Follow up in  4 weeks   2. Body mass index 36.0-36.9, adult  She has appointment Monday with Dr Friday  for elevated creatinine level       I discussed the assessment and treatment plan with the patient. The patient was provided an opportunity to ask questions and all were answered. The patient agreed with the plan and demonstrated an understanding of the instructions.   The patient was advised to call back or seek an in-person evaluation/go to the ED if the symptoms worsen or if the condition fails to improve as anticipated.  I provided 6 minutes of non-face-to-face time during this encounter.   Cyril Mourning, NP [facility] [employer], [facility]

## 2020-08-15 ENCOUNTER — Encounter: Payer: Self-pay | Admitting: Adult Health

## 2020-08-15 ENCOUNTER — Other Ambulatory Visit (HOSPITAL_COMMUNITY): Payer: Self-pay | Admitting: Nephrology

## 2020-08-15 ENCOUNTER — Other Ambulatory Visit: Payer: Self-pay | Admitting: Nephrology

## 2020-08-15 DIAGNOSIS — N1832 Chronic kidney disease, stage 3b: Secondary | ICD-10-CM

## 2020-08-15 DIAGNOSIS — N189 Chronic kidney disease, unspecified: Secondary | ICD-10-CM

## 2020-08-15 HISTORY — DX: Chronic kidney disease, unspecified: N18.9

## 2020-08-21 ENCOUNTER — Ambulatory Visit (HOSPITAL_COMMUNITY)
Admission: RE | Admit: 2020-08-21 | Discharge: 2020-08-21 | Disposition: A | Discharge status: Home / Self Care | Payer: Medicare HMO | Source: Ambulatory Visit | Attending: Nephrology | Admitting: Nephrology

## 2020-08-21 ENCOUNTER — Other Ambulatory Visit: Payer: Self-pay

## 2020-08-21 DIAGNOSIS — N1832 Chronic kidney disease, stage 3b: Secondary | ICD-10-CM | POA: Insufficient documentation

## 2020-09-08 ENCOUNTER — Other Ambulatory Visit: Payer: Self-pay

## 2020-09-08 ENCOUNTER — Encounter: Payer: Self-pay | Admitting: Adult Health

## 2020-09-08 ENCOUNTER — Telehealth (INDEPENDENT_AMBULATORY_CARE_PROVIDER_SITE_OTHER): Payer: Medicare HMO | Admitting: Adult Health

## 2020-09-08 VITALS — BP 126/60 | HR 60 | Ht 66.0 in | Wt 217.4 lb

## 2020-09-08 DIAGNOSIS — Z713 Dietary counseling and surveillance: Secondary | ICD-10-CM | POA: Diagnosis not present

## 2020-09-08 NOTE — Progress Notes (Signed)
Patient ID: [name] [name], female   DOB: [birth year], 59 y.o.   MRN: [id number]   TELEHEALTH GYNECOLOGY VISIT ENCOUNTER NOTE  I connected with [name] [name] on 09/08/20 at  9:10 AM EDT by telephone at home and verified that I am speaking with the correct person using two identifiers.   I discussed the limitations, risks, security and privacy concerns of performing an evaluation and management service by telephone and the availability of in person appointments. I also discussed with the patient that there may be a patient responsible charge related to this service. The patient expressed understanding and agreed to proceed.   History:  [name] [name] is a 59 y.o. [phone] female being evaluated today for weight loss has been on adipex and has lost over the 3 months, will take a break.  She denies any headaches, sleeping problems, or other concerns.    She sees Dr Wolfgang Phoenix in follow up soon for CKD, stage 3.   Past Medical History:  Diagnosis Date  . Abnormal Pap smear   . Adult ADHD   . Anxiety   . Arthritis    "knees" (02/10/2018)  . Bipolar disorder (HCC)   . Cholelithiases    04/2015  . CKD (chronic kidney disease) 08/15/2020   Saw Dr Wolfgang Phoenix 08/14/20  . Depression 04/15/2013  . Ear infection 03/19/2016  . Essential hypertension, benign   . ETOH abuse    drinks wine 5 day/week  . GAD (generalized anxiety disorder)   . History of blood transfusion 1980s   "related to miscarriage"  . Incompetent cervix   . Obesity   . OSA on CPAP   . Pneumonia    "used to have it alot; nothing in years" (02/10/2018)  . PTSD (post-traumatic stress disorder)   . Thickened endometrium 05/02/2014  . Vaginal Pap smear, abnormal   . Vertigo 03/19/2016   Past Surgical History:  Procedure Laterality Date  . CERVICAL CERCLAGE    . CHOLECYSTECTOMY N/A 05/24/2015   Procedure: LAPAROSCOPIC CHOLECYSTECTOMY WITH INTRAOPERATIVE CHOLANGIOGRAM;  Surgeon: Franky Macho Md, MD;  Location: [facility];  Service: General;   Laterality: N/A;  . DILATION AND CURETTAGE OF UTERUS    . JOINT REPLACEMENT    . KNEE ARTHROSCOPY Right 2015  . TONSILLECTOMY    . 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 TOTAL KNEE ARTHROPLASTY Right 02/10/2018   Procedure: TOTAL KNEE ARTHROPLASTY;  Surgeon: Marcene Corning, MD;  Location: [facility];  Service: Orthopedics;  Laterality: Right;   The following portions of the patient's history were reviewed and updated as appropriate: allergies, current medications, past family history, past medical history, past social history, past surgical history and problem list.   Health Maintenance:  Normal pap and negative HRHPV on 07/06/20.  Normal mammogram on 01/15/18.   Review of Systems:  Pertinent items noted in HPI and remainder of comprehensive ROS otherwise negative.  Physical Exam:   General:  Alert, oriented and cooperative.   Mental Status: Normal mood and affect perceived. Normal judgment and thought content.  Physical exam deferred due to nature of the encounter BP 126/60 (BP Location: Left Arm, Patient Position: Sitting, Cuff Size: Normal)   Pulse 60   Ht 5\' 6"  (1.676 m)   Wt 217 lb 6.4 oz (98.6 kg)   BMI 35.09 kg/m    Upstream - 09/08/20 11/08/20      Pregnancy Intention Screening   Does the patient want to become pregnant in the next year? N/A   postmenopausal  Does the patient's partner want to become pregnant in the next year? N/A    Would the patient like to discuss contraceptive options today? N/A      Contraception Wrap Up   Current Method No Method - Other Reason    End Method No Method - Other Reason    Contraception Counseling Provided No          Labs and Imaging No results found for this or any previous visit (from the past 336 hour(s)). US RENAL  Result Date: 08/22/2020 CLINICAL DATA:  Initial evaluation for stage III B chronic kidney disease. EXAM: RENAL / URINARY TRACT ULTRASOUND COMPLETE COMPARISON:  None available. FINDINGS: Right Kidney: Renal  measurements: 9.7 x 4.5 x 5.9 cm = volume: 136 mL. Renal echogenicity within normal limits. No nephrolithiasis or hydronephrosis. No focal renal mass. Left Kidney: Renal measurements: 9.7 x 4.5 x 4.9 cm = volume: 113 mL. Mild diffuse cortical thinning seen about the left kidney, most notable at the lower pole, and asymmetric in appearance as compared to the contralateral right kidney. Renal echogenicity remains within normal limits. No nephrolithiasis or hydronephrosis. No focal renal mass. Bladder: Appears normal for degree of bladder distention. Other: None. IMPRESSION: 1. Mild diffuse cortical thinning about the left kidney, suggesting asymmetric renal atrophy. Renal echogenicity remains within normal limits. 2. Normal sonographic appearance of the right kidney. 3. No hydronephrosis or other significant finding. Electronically Signed   By: Rise Mu M.D.   On: 08/22/2020 00:35      Assessment and Plan:     1. Weight loss counseling, encounter for Keep trying to lose weight on your own Will take a break from adipex       I discussed the assessment and treatment plan with the patient. The patient was provided an opportunity to ask questions and all were answered. The patient agreed with the plan and demonstrated an understanding of the instructions.   The patient was advised to call back or seek an in-person evaluation/go to the ED if the symptoms worsen or if the condition fails to improve as anticipated.  I provided 5 minutes of non-face-to-face time during this encounter.   Cyril Mourning, NP [facility] [employer], [facility]

## 2020-09-11 IMAGING — US US RENAL
1 series · 14 of 25 positions shown · non-contrast
Comparison: None available.

CLINICAL DATA: Initial evaluation for stage III B chronic kidney
disease.

EXAM:
RENAL / URINARY TRACT ULTRASOUND COMPLETE

[Series 1: us renal · 14 of 42 slices shown]
[im 1/42]
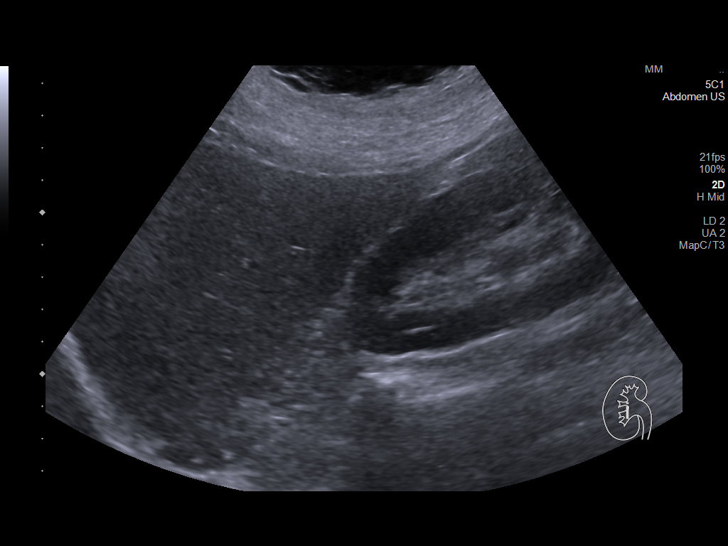
[im 4/42]
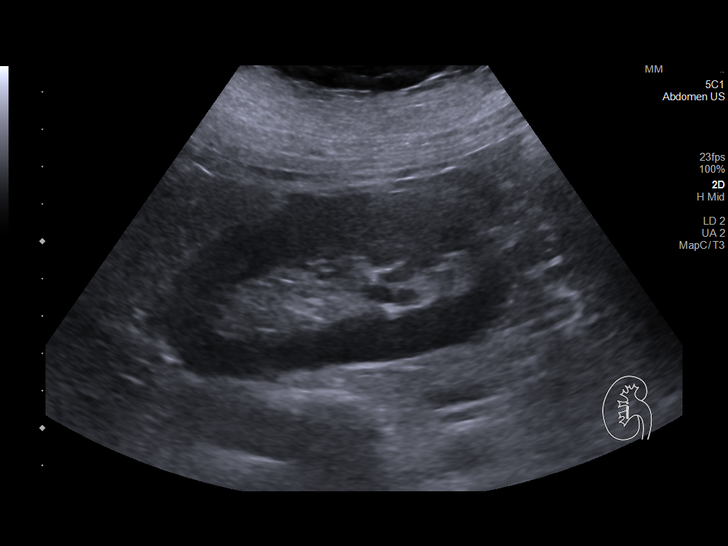
[im 7/42]
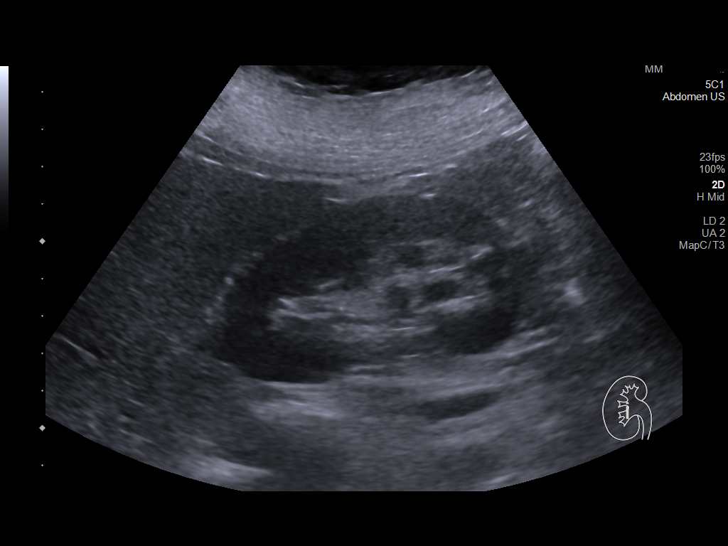
[im 11/42]
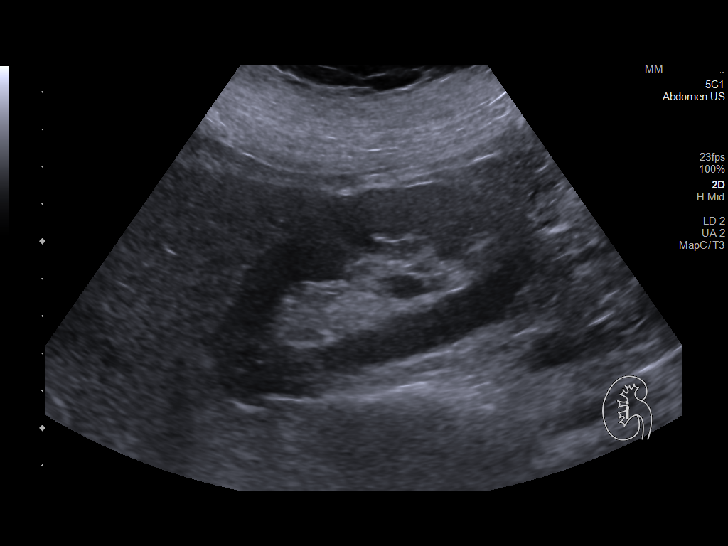
[im 14/42]
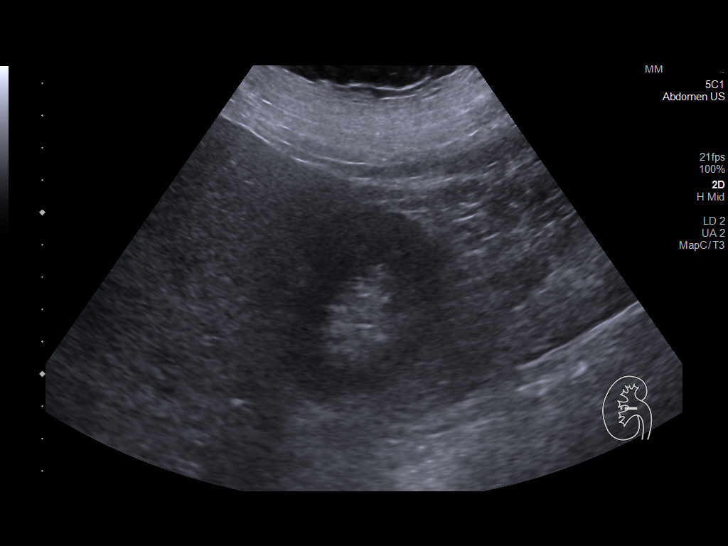
[im 16/42]
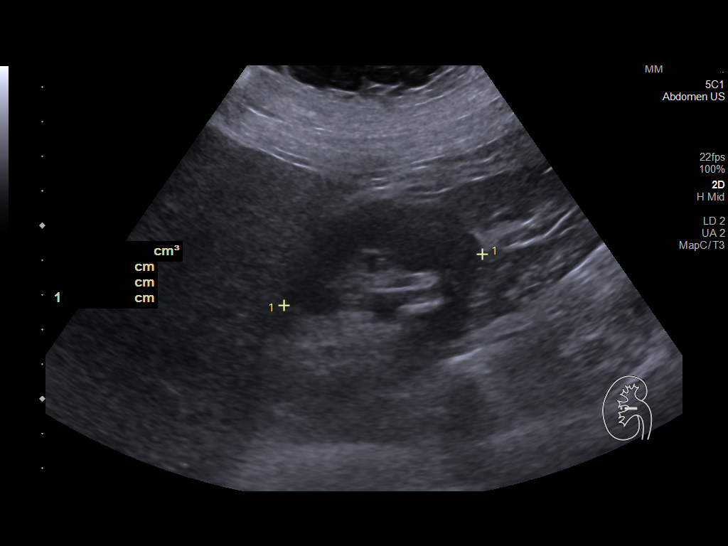
[im 19/42]
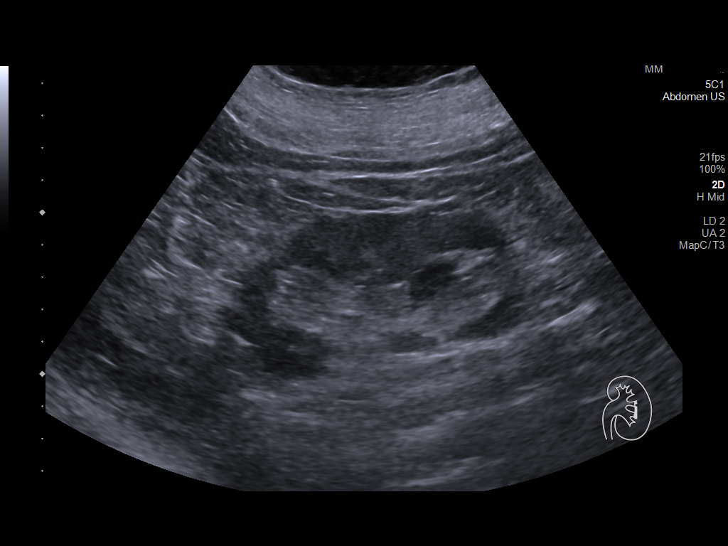
[im 23/42]
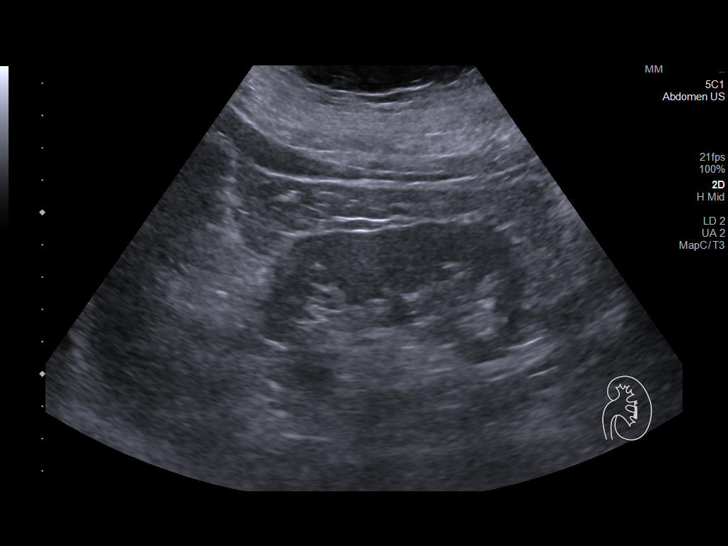
[im 26/42]
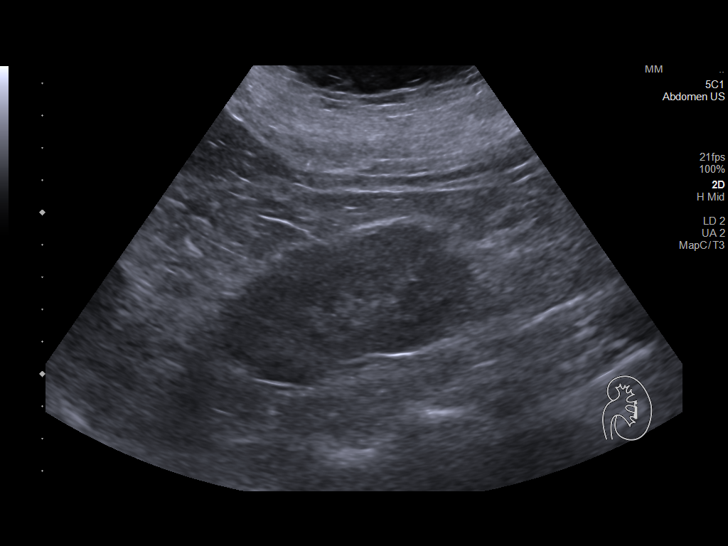
[im 28/42]
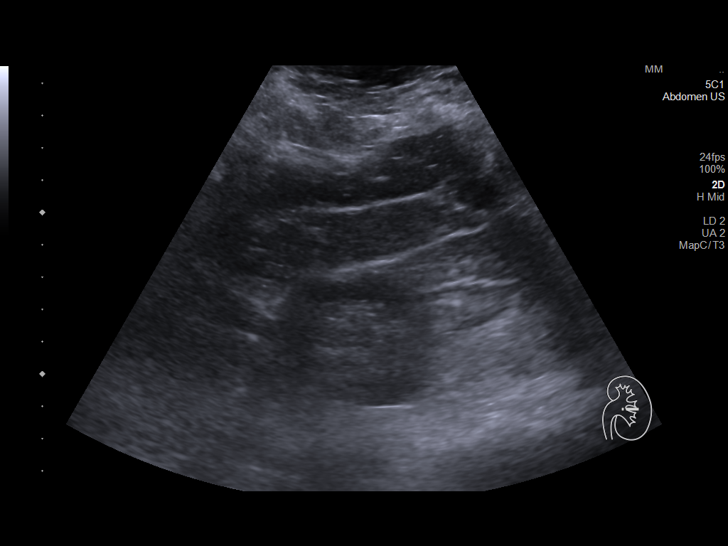
[im 31/42]
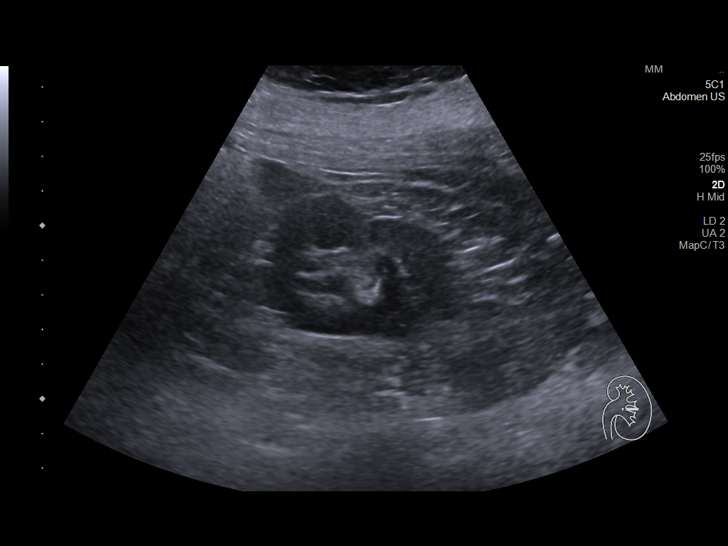
[im 35/42]
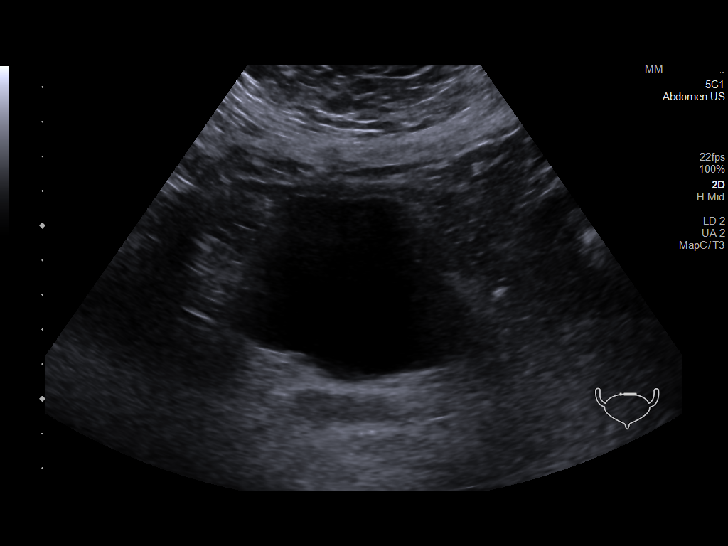
[im 38/42]
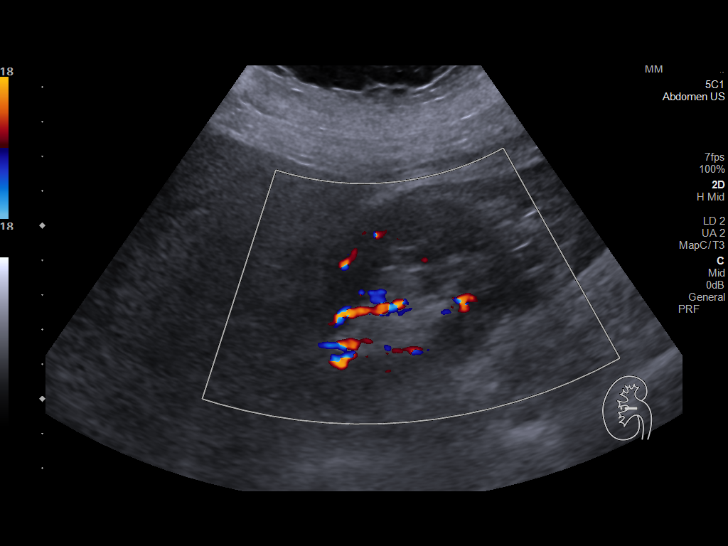
[im 42/42]
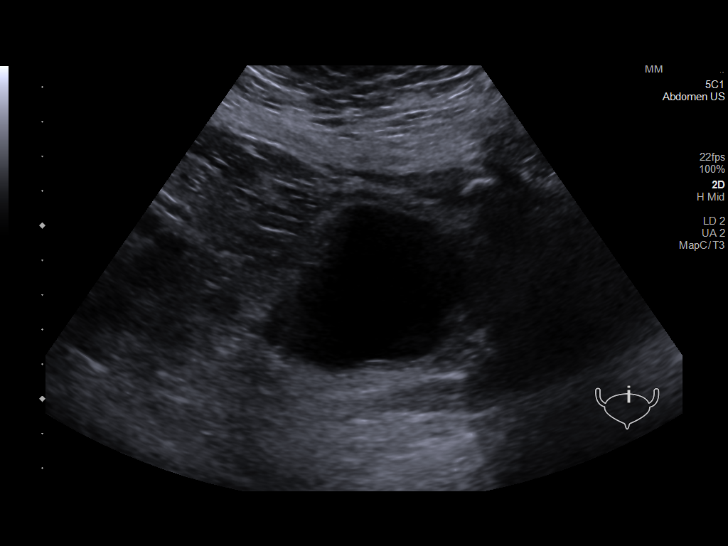

[14 of 25 positions shown; findings below may reference images not displayed]

FINDINGS: Right Kidney:

Renal measurements: 9.7 x 4.5 x 5.9 cm = volume: 136 mL. Renal
echogenicity within normal limits. No nephrolithiasis or
hydronephrosis. No focal renal mass.

Left Kidney:

Renal measurements: 9.7 x 4.5 x 4.9 cm = volume: 113 mL. Mild
diffuse cortical thinning seen about the left kidney, most notable
at the lower pole, and asymmetric in appearance as compared to the
contralateral right kidney. Renal echogenicity remains within normal
limits. No nephrolithiasis or hydronephrosis. No focal renal mass.

Bladder:

Appears normal for degree of bladder distention.

Other:

None.
IMPRESSION: 1. Mild diffuse cortical thinning about the left kidney, suggesting
asymmetric renal atrophy. Renal echogenicity remains within normal
limits.
2. Normal sonographic appearance of the right kidney.
3. No hydronephrosis or other significant finding.

## 2020-09-12 ENCOUNTER — Telehealth: Payer: Self-pay | Admitting: Genetic Counselor

## 2020-09-12 NOTE — Telephone Encounter (Signed)
Returned patient call.  LM on VM that I was returning her call.  Left CB instructions.

## 2020-10-11 ENCOUNTER — Encounter: Payer: Self-pay | Admitting: Genetic Counselor

## 2020-10-12 ENCOUNTER — Inpatient Hospital Stay (HOSPITAL_COMMUNITY): Payer: Medicare HMO

## 2020-10-12 ENCOUNTER — Inpatient Hospital Stay (HOSPITAL_COMMUNITY): Payer: Medicare HMO | Attending: Hematology | Admitting: Genetic Counselor

## 2020-10-12 ENCOUNTER — Other Ambulatory Visit: Payer: Self-pay

## 2020-10-12 DIAGNOSIS — Z8041 Family history of malignant neoplasm of ovary: Secondary | ICD-10-CM | POA: Diagnosis not present

## 2020-10-12 DIAGNOSIS — Z800 Family history of malignant neoplasm of digestive organs: Secondary | ICD-10-CM

## 2020-10-12 DIAGNOSIS — Z803 Family history of malignant neoplasm of breast: Secondary | ICD-10-CM | POA: Diagnosis not present

## 2020-10-12 DIAGNOSIS — Z808 Family history of malignant neoplasm of other organs or systems: Secondary | ICD-10-CM

## 2020-10-12 DIAGNOSIS — Z8481 Family history of carrier of genetic disease: Secondary | ICD-10-CM | POA: Diagnosis not present

## 2020-10-13 ENCOUNTER — Encounter (HOSPITAL_COMMUNITY): Payer: Self-pay | Admitting: Genetic Counselor

## 2020-10-13 DIAGNOSIS — Z8041 Family history of malignant neoplasm of ovary: Secondary | ICD-10-CM | POA: Insufficient documentation

## 2020-10-13 DIAGNOSIS — Z800 Family history of malignant neoplasm of digestive organs: Secondary | ICD-10-CM | POA: Insufficient documentation

## 2020-10-13 DIAGNOSIS — Z803 Family history of malignant neoplasm of breast: Secondary | ICD-10-CM | POA: Insufficient documentation

## 2020-10-13 DIAGNOSIS — Z8481 Family history of carrier of genetic disease: Secondary | ICD-10-CM | POA: Insufficient documentation

## 2020-10-13 DIAGNOSIS — Z808 Family history of malignant neoplasm of other organs or systems: Secondary | ICD-10-CM | POA: Insufficient documentation

## 2020-10-13 NOTE — Progress Notes (Signed)
REFERRING PROVIDER: Estill Dooms, NP [place],  [place] [id number]  PRIMARY PROVIDER:  [facility], [facility]  PRIMARY REASON FOR VISIT:  1. Family history of BRCA1 gene positive   2. Family history of pancreatic cancer   3. Family history of ovarian cancer   4. Family history of breast cancer   5. Family history of stomach cancer   6. Family history of brain cancer   7. Family history of thyroid cancer      I connected with Ms. Faulk on 10/12/2020 at 10:00 am EDT by video conference and verified that I am speaking with the correct person using two identifiers.   Patient location: [facility] Provider location: [facility] office  HISTORY OF PRESENT ILLNESS:   Ms. Daigneault, a 59 y.o. female, was seen for a [place] cancer genetics consultation due to a family history of cancer and a known BRCA1 mutation.  Ms. Vandenbrink presents to clinic today to discuss the possibility of a hereditary predisposition to cancer, genetic testing, and to further clarify her future cancer risks, as well as potential cancer risks for family members.   Ms. Narine does not have a personal history of cancer.    RISK FACTORS:  Menarche was at age 54.  First live birth at age 2.  OCP use for approximately 10 years, on and off.  Ovaries intact: yes.  Hysterectomy: no.  Menopausal status: postmenopausal.  HRT use: 0 years. Colonoscopy: yes; 2014 - no polyps. Mammogram within the last year: no. Number of breast biopsies: 0. Any excessive radiation exposure in the past: no   Past Medical History:  Diagnosis Date  . Abnormal Pap smear   . Adult ADHD   . Anxiety   . Arthritis    "knees" (02/10/2018)  . Bipolar disorder ([place])   . Cholelithiases    04/2015  . CKD (chronic kidney disease) 08/15/2020   Saw Dr Theador Hawthorne 08/14/20  . Depression 04/15/2013  . Ear infection 03/19/2016  . Essential hypertension, benign   . ETOH abuse    drinks wine 5 day/week  . Family  history of brain cancer   . Family history of BRCA1 gene positive   . Family history of breast cancer   . Family history of ovarian cancer   . Family history of pancreatic cancer   . Family history of stomach cancer   . Family history of thyroid cancer   . GAD (generalized anxiety disorder)   . History of blood transfusion 1980s   "related to miscarriage"  . Incompetent cervix   . Obesity   . OSA on CPAP   . Pneumonia    "used to have it alot; nothing in years" (02/10/2018)  . PTSD (post-traumatic stress disorder)   . Thickened endometrium 05/02/2014  . Vaginal Pap smear, abnormal   . Vertigo 03/19/2016    Past Surgical History:  Procedure Laterality Date  . CERVICAL CERCLAGE    . CHOLECYSTECTOMY N/A 05/24/2015   Procedure: LAPAROSCOPIC CHOLECYSTECTOMY WITH INTRAOPERATIVE CHOLANGIOGRAM;  Surgeon: Aviva Signs Md, MD;  Location: [facility];  Service: General;  Laterality: N/A;  . DILATION AND CURETTAGE OF UTERUS    . JOINT REPLACEMENT    . KNEE ARTHROSCOPY Right 2015  . TONSILLECTOMY    . 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 TOTAL KNEE ARTHROPLASTY Right 02/10/2018   Procedure: TOTAL KNEE ARTHROPLASTY;  Surgeon: Melrose Nakayama, MD;  Location: [facility];  Service: Orthopedics;  Laterality: Right;  Social History   Socioeconomic History  . Marital status: Married    Spouse name: [name]  . Number of children: 2  . Years of education: Not on file  . Highest education level: Not on file  Occupational History  . Occupation: [employer]    Employer: [employer]    Comment: retired   Tobacco Use  . Smoking status: Former Smoker    Packs/day: 1.00    Years: 25.00    Pack years: 25.00    Types: Cigarettes    Quit date: 12/30/2005    Years since quitting: 14.7  . Smokeless tobacco: Never Used  Vaping Use  . Vaping Use: Never used  Substance and Sexual Activity  . Alcohol use: Yes    Alcohol/week: 7.0 standard drinks    Types: 5  Glasses of wine, 2 Standard drinks or equivalent per week    Comment: occ  . Drug use: No  . Sexual activity: Yes    Birth control/protection: Post-menopausal  Other Topics Concern  . Not on file  Social History Narrative  . Not on file   Social Determinants of Health   Financial Resource Strain: Low Risk   . Difficulty of Paying Living Expenses: Not hard at all  Food Insecurity: No Food Insecurity  . Worried About Charity fundraiser in the Last Year: Never true  . Ran Out of Food in the Last Year: Never true  Transportation Needs: No Transportation Needs  . Lack of Transportation (Medical): No  . Lack of Transportation (Non-Medical): No  Physical Activity: Inactive  . Days of Exercise per Week: 0 days  . Minutes of Exercise per Session: 0 min  Stress: No Stress Concern Present  . Feeling of Stress : Only a little  Social Connections: Moderately Integrated  . Frequency of Communication with Friends and Family: More than three times a week  . Frequency of Social Gatherings with Friends and Family: Three times a week  . Attends Religious Services: More than 4 times per year  . Active Member of Clubs or Organizations: No  . Attends Archivist Meetings: Never  . Marital Status: Married     FAMILY HISTORY:  We obtained a detailed, 4-generation family history.  Significant diagnoses are listed below: Family History  Problem Relation Age of Onset  . Hypertension Father   . Cancer Father 67       sarcoma on face  . Hypertension Mother   . Pancreatic cancer Mother 101  . Asthma Mother   . Sleep apnea Mother   . Coronary artery disease Maternal Grandfather   . Sleep apnea Brother   . Stomach cancer Paternal Aunt        dx in her late 12s  . Stomach cancer Paternal Uncle 49  . Brain cancer Paternal Uncle 76  . Cirrhosis Paternal Grandmother        never drank  . Stroke Maternal Aunt   . Lung cancer Maternal Aunt 63       lung  . Heart attack Maternal Aunt   .  Diabetes Other   . Ovarian cancer Maternal Aunt 68       ovarian cancer  . Dementia Maternal Aunt   . Breast cancer Cousin 27       maternal cousin  . BRCA 1/2 Cousin        BRCA1 positive ([id number])  . Thyroid cancer Paternal Aunt  dx in her 50s/60s  . Cancer Paternal Uncle        unknown type  . Pancreatic cancer Cousin        dx in his 88s, paternal cousin  . Colon cancer Neg Hx   . Esophageal cancer Neg Hx   . Rectal cancer Neg Hx    Ms. Perona has one daughter (age 80) and one son (age 12). She had two other children who died shortly after birth. She has one brother who is 64. None of these family members have had cancer.   Ms. Kief mother died at the age of 58 from pancreatic cancer. She notes that her mother had her ovaries removed in her 11s. Ms. Kuriakose had five maternal aunts and two maternal uncles. One aunt was diagnosed with lung cancer at the age of 78. Another aunt was diagnosed with ovarian cancer at the age of 32. Her maternal grandmother died in her late 40s and her maternal grandfather died at the age of 83 from a heart attack. One of Ms. Nile's maternal first cousins, [name], was diagnosed with breast cancer at the age of 82 and had genetic testing that revealed a mutation in the BRCA1 gene called [id number]. No one else in the family has had genetic testing, to Ms. Naef's knowledge. She also notes that at least two of her aunts had their ovaries removed.  Ms. Boran father is 81 and had a sarcoma diagnosed and removed from his cheek at the age of 52. She had three paternal uncles and five paternal aunts. One aunt had stomach cancer diagnosed in her late 57s. One uncle had brain cancer diagnosed around age 51 and stomach cancer diagnosed around age 47. Another aunt may have had thyroid cancer diagnosed in her 50s or 74s. One of her paternal first cousins died from pancreatic cancer in his 43s. Her paternal grandmother died in her 22s  from cirrhosis of the liver, and her paternal grandfather died in his 36s or 31D due to complications from alcoholism.  Ms. Sabater is aware of previous family history of genetic testing for hereditary cancer risks. Patient's maternal and paternal ancestors are of [place] descent. There is no reported Ashkenazi Jewish ancestry. There is no known consanguinity.  GENETIC COUNSELING ASSESSMENT: Ms. Shearon is a 60 y.o. female with a family history of cancer and a known BRCA1 mutation, which is associated with Hereditary Breast and Ovarian Cancer syndrome and a predisposition to cancer. We, therefore, discussed and recommended the following at today's visit.   DISCUSSION: One of Ms. Sankey's family members (a maternal first cousin) had genetic testing that was positive for a mutation in the BRCA1 gene called [id number]. Individuals who have a first cousin with a genetic mutation typically have a 12.5% (1 in 8) chance to also have the mutation. We reviewed the cancer risks that are associated with BRCA1 mutations, including an increased risk of breast, ovarian, pancreatic, prostate, and female breast cancer. Individuals with BRCA1 mutations may opt for increased cancer screening for associated cancer risks per the NCCN guidelines.  We discussed that testing is beneficial for multiple reasons including knowing about potential cancer risks and identifying screening and risk-reduction options that may be appropriate. We reviewed the characteristics, features and inheritance patterns of hereditary cancer syndromes. We also discussed genetic testing, including the appropriate family members to test, the process of testing, insurance coverage and turn-around-time for results. We discussed the implications of a negative, positive and/or variant of uncertain significant result.  We recommended [name] pursue genetic testing for the [facility] Common Hereditary Cancers panel.   The Common Hereditary Cancers Panel  offered by [facility] includes sequencing and/or deletion duplication testing of the following 48 genes: APC, ATM, AXIN2, BARD1, BMPR1A, BRCA1, BRCA2, BRIP1, CDH1, CDK4, CDKN2A (p14ARF), CDKN2A (p16INK4a), CHEK2, CTNNA1, DICER1, EPCAM (Deletion/duplication testing only), GREM1 (promoter region deletion/duplication testing only), KIT, MEN1, MLH1, MSH2, MSH3, MSH6, MUTYH, NBN, NF1, NTHL1, PALB2, PDGFRA, PMS2, POLD1, POLE, PTEN, RAD50, RAD51C, RAD51D, RNF43, SDHB, SDHC, SDHD, SMAD4, SMARCA4. STK11, TP53, TSC1, TSC2, and VHL.  The following genes are evaluated for sequence changes only: SDHA and HOXB13 c.251G>A variant only.   Based on Ms. Davitt's family history of cancer and a known BRCA1 mutation, she meets medical criteria for genetic testing. Despite that she meets criteria, there may still be an out of pocket cost. We discussed that if her out of pocket cost for testing is over $100, the laboratory will reach out to let her know. If the out of pocket cost of testing is less than $100 she will be billed by the genetic testing laboratory.   We discussed that some people do not want to undergo genetic testing due to fear of genetic discrimination.  A federal law called the Genetic Information Non-Discrimination Act (GINA) of 2008 helps protect individuals against genetic discrimination based on their genetic test results.  It impacts both health insurance and employment.  With health insurance, it protects against increased premiums, being kicked off insurance or being forced to take a test in order to be insured.  For employment it protects against hiring, firing and promoting decisions based on genetic test results.  Health status due to a cancer diagnosis is not protected under GINA.  Additionally, life, disability, and long-term care insurance is not protected under GINA.    PLAN: After considering the risks, benefits, and limitations, Ms. Huizenga provided informed consent to pursue genetic testing and the  blood sample was sent to [facility] for analysis of the Common Hereditary Cancers Panel. Results should be available within approximately two-three weeks' time, at which point they will be disclosed by telephone to Ms. Willig, as will any additional recommendations warranted by these results. Ms. Shoe will receive a summary of her genetic counseling visit and a copy of her results once available. This information will also be available in Epic.   Ms. Kreiger questions were answered to her satisfaction today. Our contact information was provided should additional questions or concerns arise. Thank you for the referral and allowing [place] to share in the care of your patient.   Clint Guy, [place], [facility] Licensed, Certified Dispensing [email or web address] Phone: [phone]  The patient was seen for a total of 50 minutes in face-to-face genetic counseling.  This patient was discussed with Drs. Magrinat, Lindi Adie and/or Burr Medico who agrees with the above.    _______________________________________________________________________ For Office Staff:  Number of people involved in session: 1 Was an Intern/ student involved with case: no

## 2020-10-18 ENCOUNTER — Telehealth: Payer: Self-pay

## 2020-10-18 NOTE — Telephone Encounter (Signed)
She wants me to ask Dr Despina Hidden if she needs notes from NP if he can sign for her, she is aware that he is out of the office

## 2020-10-18 NOTE — Telephone Encounter (Signed)
Pt needs her referral signed off for her her disability for her job and the physiatrist is a NP and she needs an MD signature, pt is unsure of what type of form she needs the signature for but wanted some advice from [place].

## 2020-10-27 ENCOUNTER — Telehealth: Payer: Self-pay | Admitting: Genetic Counselor

## 2020-10-27 DIAGNOSIS — Z1379 Encounter for other screening for genetic and chromosomal anomalies: Secondary | ICD-10-CM | POA: Insufficient documentation

## 2020-10-27 NOTE — Telephone Encounter (Signed)
LVM that her genetic test results are available and requested that she call back to discuss them.  

## 2020-11-02 NOTE — Telephone Encounter (Signed)
Called to discuss genetic test results with Ms. Mcelmurry, although her phone is about to run out of battery. She will give me a call back when she can charge her phone.

## 2020-11-03 ENCOUNTER — Encounter: Payer: Self-pay | Admitting: Genetic Counselor

## 2020-11-03 ENCOUNTER — Ambulatory Visit: Payer: Self-pay | Admitting: Genetic Counselor

## 2020-11-03 DIAGNOSIS — Z1379 Encounter for other screening for genetic and chromosomal anomalies: Secondary | ICD-10-CM

## 2020-11-03 NOTE — Telephone Encounter (Signed)
Discussed with Ms. Behe that genetic testing showed that she did not inherit the BRCA1 variant that was identified in her cousin. This is considered to be a true negative result and indicates that her risk for BRCA1-related cancers are expected to be the same as they are in the general population.  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 Because she has no family history of colon cancer, no specialized screening is warranted per NCCN guidelines (v1.2021). We also discussed how this information pertains to her family members and reviewed the recommendation for them to pursue genetic testing for this mutation as well.

## 2020-11-03 NOTE — Progress Notes (Signed)
GENETIC TEST RESULTS   Patient Name: [name] [name] Patient Age: 59 y.o. Encounter Date: 11/03/2020  Referring Provider: Estill Dooms, NP [place],  [place] [address]    HPI:  [name] [name] was previously seen in the [facility] due to a family history of a known BRCA1 mutation and cancer, and concerns regarding a hereditary predisposition to cancer. Please refer to our prior cancer genetics clinic note for more information regarding our discussion, assessment and recommendations, at the time. [name] [name] recent genetic test results were disclosed to her, as were recommendations warranted by these results. These results and recommendations are discussed in more detail below.  FAMILY HISTORY:  We obtained a detailed, 4-generation family history.  Significant diagnoses are listed below: Family History  Problem Relation Age of Onset  . Hypertension Father   . Cancer Father 7       sarcoma on face  . Hypertension Mother   . Pancreatic cancer Mother 21  . Asthma Mother   . Sleep apnea Mother   . Coronary artery disease Maternal Grandfather   . Sleep apnea Brother   . Stomach cancer Paternal Aunt        dx in her late 74s  . Stomach cancer Paternal Uncle 45  . Brain cancer Paternal Uncle 35  . Cirrhosis Paternal Grandmother        never drank  . Stroke Maternal Aunt   . Lung cancer Maternal Aunt 4       lung  . Heart attack Maternal Aunt   . Diabetes Other   . Ovarian cancer Maternal Aunt 68       ovarian cancer  . Dementia Maternal Aunt   . Breast cancer Cousin 86       maternal cousin  . BRCA 1/2 Cousin        BRCA1 positive ([id number])  . Thyroid cancer Paternal Aunt        dx in her 50s/60s  . Cancer Paternal Uncle        unknown type  . Pancreatic cancer Cousin        dx in his 46s, paternal cousin  . Colon cancer Neg Hx   . Esophageal cancer Neg Hx   . Rectal cancer Neg Hx    [name] [name] has one daughter (age  84) and one son (age 17). She had two other children who died shortly after birth. She has one brother who is 59. None of these family members have had cancer.   [name] [name] mother died at the age of 74 from pancreatic cancer. She notes that her mother had her ovaries removed in her 52s. [name] [name] had five maternal aunts and two maternal uncles. One aunt was diagnosed with lung cancer at the age of 74. Another aunt was diagnosed with ovarian cancer at the age of 80. Her maternal grandmother died in her late 36s and her maternal grandfather died at the age of 62 from a heart attack. One of [name] [name]'s maternal first cousins, [name] [name], was diagnosed with breast cancer at the age of 51 and had genetic testing that revealed a mutation in the BRCA1 gene called [id number]. No one else in the family has had genetic testing, to [name]'s knowledge. She also notes that at least two of her aunts had their ovaries removed.  [name] father is 51 and had a sarcoma diagnosed and removed from his cheek at the age of 20.  She had three paternal uncles and five paternal aunts. One aunt had stomach cancer diagnosed in her late 3s. One uncle had brain cancer diagnosed around age 22 and stomach cancer diagnosed around age 20. Another aunt may have had thyroid cancer diagnosed in her 62s or 76s. One of her paternal first cousins died from pancreatic cancer in his 17s. Her paternal grandmother died in her 41s from cirrhosis of the liver, and her paternal grandfather died in his 50s or 09F due to complications from alcoholism.  [name] is aware of previous family history of genetic testing for hereditary cancer risks. Patient's maternal and paternal ancestors are of [place] descent. There is no reported Ashkenazi Jewish ancestry. There is no known consanguinity  GENETIC TEST RESULTS:  We recommended [name] [name] pursue testing for the known BRCA1 mutation in the family called [id number]  [phone]), as well as a known familial variant of uncertain significance (VUS) in the PALB2 gene called c.3418T>G (p.Trp1140Gly). Genetic testing reported out on 10/27/2020 through the [facility] Common Hereditary Cancers panel.    [name] [name] test did not reveal the familial BRCA1 mutation or PALB2 VUS. We call this result a true negative result because the cancer-causing mutation was identified in Ms. Volkov's family, and she did not inherit it.  Given this negative result, [name] [name]'s chances of developing BRCA1-related cancers are expected to be the same as they are in the general population.    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 Since [name] [name] has only one pathogenic mutation in MUTYH, she is NOT affected with autosomal recessive MUTYH-associated polyposis, but instead is a carrier. A copy of the test report will be scanned into Epic and is located under the Molecular Pathology section of the Results Review tab.   The Common Hereditary Cancers Panel offered by [facility] includes sequencing and/or deletion duplication testing of the following 48 genes: APC, ATM, AXIN2, BARD1, BMPR1A, BRCA1, BRCA2, BRIP1, CDH1, CDK4, CDKN2A (p14ARF), CDKN2A (p16INK4a), CHEK2, CTNNA1, DICER1, EPCAM (Deletion/duplication testing only), GREM1 (promoter region deletion/duplication testing only), KIT, MEN1, MLH1, MSH2, MSH3, MSH6, MUTYH, NBN, NF1, NTHL1, PALB2, PDGFRA, PMS2, POLD1, POLE, PTEN, RAD50, RAD51C, RAD51D, RNF43, SDHB, SDHC, SDHD, SMAD4, SMARCA4. STK11, TP53, TSC1, TSC2, and VHL.  The following genes were evaluated for sequence changes only: SDHA and HOXB13 c.251G>A variant only. The test report will be scanned into EPIC and located under the Molecular Pathology section of the Results Review tab.  A portion of the result report is included below for reference.     DISCUSSION OF RESULTS & RECOMMENDATIONS: We discussed the implications  of a heterozygous MUTYH mutation with [name] [name], and discussed who else in the family should have genetic testing. We recommended [name] [name] follow the most updated medical management guidelines (NCCN Guidelines Genetic/Familial High Risk Assessment: Colorectal v1.2021) for heterozygous MUTYH mutations; all of which are outlined below. Because Ms. Lakey does not have a personal or family history of colon cancer, no specialized colon cancer screening is warranted based on this genetic test result.    For individuals unaffected with colorectal cancer with a fist-degree relative with colorectal cancer: High-quality colonoscopy every 5 years starting at age 57 or 31 years younger than the earliest age of colorectal cancer onset, whichever is younger.  For individuals with a second-degree relative with colorectal cancer: There are no specific data available to determine screening recommendations for a patient with an MUTYH heterozygous pathogenic variant and a second-degree relative affected  with colorectal cancer.  For individuals unaffected by colorectal cancer with NO family history of colorectal cancer: Data is uncertain on whether specialized screening is warranted.  Because current genetic testing is not perfect, it is possible there may be a gene mutation in one of these genes that current testing cannot detect, but that chance is small. There could be another gene that has not yet been discovered, or that we have not yet tested, that is responsible for the cancer diagnoses in the family. It is also possible there is a hereditary cause for the cancer in the family that Ms. Lokken did not inherit and therefore was not identified in her testing. Therefore, it is important to remain in touch with cancer genetics in the future so that we can continue to offer Ms. Nolde the most up to date genetic testing.   An individual's cancer risk and medical management are not determined by genetic test results  alone. Overall cancer risk assessment incorporates additional factors, including personal medical history, family history, and any available genetic information that may result in a personalized plan for cancer prevention and surveillance.  FAMILY MEMBERS: Since we now know the MUTYH mutation in [name] [name], we can test at-risk relatives to determine whether or not they have inherited the mutation and have an increased chance to have a child with MUTYH-associated polyposis. We will be happy to meet with any of the family members or refer them to a genetic counselor in their local area. To locate genetic counselors in other cities, individuals can visit the website of the [employer] (ArtistMovie.[email or web address]) and Secretary/administrator for a Social worker by zip code.   We encouraged Ms. Fadely to remain in contact with [place] in cancer genetics on an annual basis so we can update Ms. Leon's personal and family histories, and inform her of advances in cancer genetics that may be of benefit for the entire family. Ms. Hunn is welcome to call with any questions or concerns, at any time.   Clint Guy, MS, [facility] Genetic Counselor [email or web address] Phone: [phone]

## 2020-11-05 ENCOUNTER — Encounter: Payer: Self-pay | Admitting: Emergency Medicine

## 2020-11-05 ENCOUNTER — Ambulatory Visit
Admission: EM | Admit: 2020-11-05 | Discharge: 2020-11-05 | Disposition: A | Discharge status: Home / Self Care | Payer: Medicare HMO | Attending: Emergency Medicine | Admitting: Emergency Medicine

## 2020-11-05 ENCOUNTER — Other Ambulatory Visit: Payer: Self-pay

## 2020-11-05 DIAGNOSIS — H1031 Unspecified acute conjunctivitis, right eye: Secondary | ICD-10-CM

## 2020-11-05 MED ORDER — OFLOXACIN 0.3 % OP SOLN: 1.0000 [drp] | Freq: Four times a day (QID) | OPHTHALMIC | 0 refills | Status: DC

## 2020-11-05 NOTE — Discharge Instructions (Signed)
Use eye drops as prescribed and to completion Dispose of old contacts and wear glasses until you have finished course of antibiotic eye drops Wash pillow cases, wash hands regularly with soap and water, avoid touching your face and eyes, wash door handles, light switches, remotes and other objects you frequently touch Return or follow up with PCP if symptoms persists such as fever, chills, redness, swelling, eye pain, painful eye movements, vision changes, etc... 

## 2020-11-05 NOTE — ED Provider Notes (Signed)
[facility]   [id number] 11/05/20 Arrival Time: 8841  CC: Red eye  SUBJECTIVE:  [name] [name] is a 59 y.o. female who presented to the urgent care with a complaint of right eye redness, pain and drainage that started this morning.  Denies a precipitating event, trauma, or close contacts with similar symptoms.  Has tried OTC eye drops without relief.  Denies alleviating or aggravating factors.  Denies similar symptoms in the past.  Denies fever, chills, nausea, vomiting, eye pain, painful eye movements, halos, discharge, itching, vision changes, double vision, FB sensation, periorbital erythema.     Report contact lens use.    ROS: As per HPI.  All other pertinent ROS negative.     Past Medical History:  Diagnosis Date  . Abnormal Pap smear   . Adult ADHD   . Anxiety   . Arthritis    "knees" (02/10/2018)  . Bipolar disorder ([place])   . Cholelithiases    04/2015  . CKD (chronic kidney disease) 08/15/2020   Saw Dr Theador Hawthorne 08/14/20  . Depression 04/15/2013  . Ear infection 03/19/2016  . Essential hypertension, benign   . ETOH abuse    drinks wine 5 day/week  . Family history of brain cancer   . Family history of BRCA1 gene positive   . Family history of breast cancer   . Family history of ovarian cancer   . Family history of pancreatic cancer   . Family history of stomach cancer   . Family history of thyroid cancer   . GAD (generalized anxiety disorder)   . History of blood transfusion 1980s   "related to miscarriage"  . Incompetent cervix   . Obesity   . OSA on CPAP   . Pneumonia    "used to have it alot; nothing in years" (02/10/2018)  . PTSD (post-traumatic stress disorder)   . Thickened endometrium 05/02/2014  . Vaginal Pap smear, abnormal   . Vertigo 03/19/2016   Past Surgical History:  Procedure Laterality Date  . CERVICAL CERCLAGE    . CHOLECYSTECTOMY N/A 05/24/2015   Procedure: LAPAROSCOPIC CHOLECYSTECTOMY WITH INTRAOPERATIVE CHOLANGIOGRAM;  Surgeon: Aviva Signs Md, MD;  Location: [facility];  Service: General;  Laterality: N/A;  . DILATION AND CURETTAGE OF UTERUS    . JOINT REPLACEMENT    . KNEE ARTHROSCOPY Right 2015  . TONSILLECTOMY    . 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 TOTAL KNEE ARTHROPLASTY Right 02/10/2018   Procedure: TOTAL KNEE ARTHROPLASTY;  Surgeon: Melrose Nakayama, MD;  Location: [facility];  Service: Orthopedics;  Laterality: Right;   No Known Allergies No current facility-administered medications on file prior to encounter.   Current Outpatient Medications on File Prior to Encounter  Medication Sig Dispense Refill  . ALPRAZolam (XANAX) 0.25 MG tablet Take 0.25 mg by mouth at bedtime as needed for sleep.    Marland Kitchen escitalopram (LEXAPRO) 20 MG tablet TAKE 1/2 TABLET BY MOUTH DAILY 90 tablet 3  . hydrochlorothiazide (HYDRODIURIL) 25 MG tablet TAKE 1 TABLET(25 MG) BY MOUTH DAILY 90 tablet 3  . lamoTRIgine (LAMICTAL) 200 MG tablet Take 100-200 mg by mouth See admin instructions. Take 100 mg in the morning and 200 mg in the evening  5  . lisinopril (ZESTRIL) 10 MG tablet TAKE 1 TABLET BY MOUTH DAILY 90 tablet 3  . phentermine (ADIPEX-P) 37.5 MG tablet Take 1 tablet (37.5 mg total) by mouth daily before breakfast. 30 tablet 0  . tiZANidine (ZANAFLEX) 4 MG tablet Take 4 mg by  mouth every 6 (six) hours as needed.     Social History   Socioeconomic History  . Marital status: Married    Spouse name: [name]  . Number of children: 2  . Years of education: Not on file  . Highest education level: Not on file  Occupational History  . Occupation: Department of Social Services    Employer: [employer]    Comment: retired   Tobacco Use  . Smoking status: Former Smoker    Packs/day: 1.00    Years: 25.00    Pack years: 25.00    Types: Cigarettes    Quit date: 12/30/2005    Years since quitting: 14.8  . Smokeless tobacco: Never Used  Vaping Use  . Vaping Use: Never used  Substance and Sexual Activity  .  Alcohol use: Yes    Alcohol/week: 7.0 standard drinks    Types: 5 Glasses of wine, 2 Standard drinks or equivalent per week    Comment: occ  . Drug use: No  . Sexual activity: Yes    Birth control/protection: Post-menopausal  Other Topics Concern  . Not on file  Social History Narrative  . Not on file   Social Determinants of Health   Financial Resource Strain: Low Risk   . Difficulty of Paying Living Expenses: Not hard at all  Food Insecurity: No Food Insecurity  . Worried About Charity fundraiser in the Last Year: Never true  . Ran Out of Food in the Last Year: Never true  Transportation Needs: No Transportation Needs  . Lack of Transportation (Medical): No  . Lack of Transportation (Non-Medical): No  Physical Activity: Inactive  . Days of Exercise per Week: 0 days  . Minutes of Exercise per Session: 0 min  Stress: No Stress Concern Present  . Feeling of Stress : Only a little  Social Connections: Moderately Integrated  . Frequency of Communication with Friends and Family: More than three times a week  . Frequency of Social Gatherings with Friends and Family: Three times a week  . Attends Religious Services: More than 4 times per year  . Active Member of Clubs or Organizations: No  . Attends Archivist Meetings: Never  . Marital Status: Married  Human resources officer Violence: Not At Risk  . Fear of Current or Ex-Partner: No  . Emotionally Abused: No  . Physically Abused: No  . Sexually Abused: No   Family History  Problem Relation Age of Onset  . Hypertension Father   . Cancer Father 48       sarcoma on face  . Hypertension Mother   . Pancreatic cancer Mother 68  . Asthma Mother   . Sleep apnea Mother   . Coronary artery disease Maternal Grandfather   . Sleep apnea Brother   . Stomach cancer Paternal Aunt        dx in her late 90s  . Stomach cancer Paternal Uncle 90  . Brain cancer Paternal Uncle 50  . Cirrhosis Paternal Grandmother        never drank    . Stroke Maternal Aunt   . Lung cancer Maternal Aunt 66       lung  . Heart attack Maternal Aunt   . Diabetes Other   . Ovarian cancer Maternal Aunt 68       ovarian cancer  . Dementia Maternal Aunt   . Breast cancer Cousin 52       maternal cousin  . BRCA 1/2  Cousin        BRCA1 positive ([id number])  . Thyroid cancer Paternal Aunt        dx in her 50s/60s  . Cancer Paternal Uncle        unknown type  . Pancreatic cancer Cousin        dx in his 61s, paternal cousin  . Colon cancer Neg Hx   . Esophageal cancer Neg Hx   . Rectal cancer Neg Hx     OBJECTIVE:    Visual Acuity  Right Eye Distance:   Left Eye Distance:   Bilateral Distance:    Right Eye Near:   Left Eye Near:    Bilateral Near:      Vitals:   11/05/20 1039  BP: (!) 165/85  Pulse: 71  Resp: 18  Temp: 97.8 F (36.6 C)  TempSrc: Oral  SpO2: 98%  Weight: 220 lb (99.8 kg)  Height: '5\' 6"'  (1.676 m)    Physical Exam Vitals and nursing note reviewed.  Constitutional:      General: She is not in acute distress.    Appearance: Normal appearance. She is normal weight. She is not ill-appearing, toxic-appearing or diaphoretic.  HENT:     Head: Normocephalic.  Eyes:     General: Lids are normal. Vision grossly intact.        Right eye: Discharge present. No foreign body or hordeolum.        Left eye: No foreign body, discharge or hordeolum.     Extraocular Movements: Extraocular movements intact.     Conjunctiva/sclera:     Right eye: Right conjunctiva is not injected. Exudate present. No chemosis.    Left eye: Left conjunctiva is not injected. No chemosis. Cardiovascular:     Rate and Rhythm: Normal rate and regular rhythm.     Pulses: Normal pulses.     Heart sounds: Normal heart sounds. No murmur heard.  No friction rub. No gallop.   Pulmonary:     Effort: Pulmonary effort is normal. No respiratory distress.     Breath sounds: Normal breath sounds. No stridor. No wheezing, rhonchi or  rales.  Chest:     Chest wall: No tenderness.  Neurological:     Mental Status: She is alert and oriented to person, place, and time.      ASSESSMENT & PLAN:  1. Acute bacterial conjunctivitis of right eye     Meds ordered this encounter  Medications  . ofloxacin (OCUFLOX) 0.3 % ophthalmic solution    Sig: Place 1 drop into both eyes 4 (four) times daily.    Dispense:  5 mL    Refill:  0    Discharge instructions  Use eye drops as prescribed and to completion Dispose of old contacts and wear glasses until you have finished course of antibiotic eye drops Wash pillow cases, wash hands regularly with soap and water, avoid touching your face and eyes, wash door handles, light switches, remotes and other objects you frequently touch Return or follow up with PCP if symptoms persists such as fever, chills, redness, swelling, eye pain, painful eye movements, vision changes, etc...  Reviewed expectations re: course of current medical issues. Questions answered. Outlined signs and symptoms indicating need for more acute intervention. Patient verbalized understanding. After Visit Summary given.   Emerson Monte, FNP 11/05/20 1115

## 2020-11-05 NOTE — ED Triage Notes (Signed)
Redness, pain, and tearing up that started this morning to RT eye doesn't recall any injury

## 2020-11-07 ENCOUNTER — Encounter: Payer: Medicare HMO | Admitting: Obstetrics & Gynecology

## 2020-11-08 ENCOUNTER — Encounter: Payer: Self-pay | Admitting: Genetic Counselor

## 2020-12-31 NOTE — Progress Notes (Signed)
This encounter was created in error - please disregard.

## 2021-01-03 ENCOUNTER — Ambulatory Visit: Payer: Medicare HMO | Admitting: Internal Medicine

## 2021-01-22 ENCOUNTER — Telehealth (INDEPENDENT_AMBULATORY_CARE_PROVIDER_SITE_OTHER): Payer: Medicare HMO | Admitting: Adult Health

## 2021-01-22 ENCOUNTER — Encounter: Payer: Self-pay | Admitting: Adult Health

## 2021-01-22 VITALS — BP 140/70 | HR 67 | Ht 66.0 in | Wt 223.0 lb

## 2021-01-22 DIAGNOSIS — Z6835 Body mass index (BMI) 35.0-35.9, adult: Secondary | ICD-10-CM

## 2021-01-22 DIAGNOSIS — Z713 Dietary counseling and surveillance: Secondary | ICD-10-CM

## 2021-01-22 MED ORDER — PHENTERMINE HCL 37.5 MG PO TABS: 37.5000 mg | ORAL_TABLET | Freq: Every day | ORAL | 0 refills | Status: DC

## 2021-01-22 NOTE — Progress Notes (Signed)
Patient ID: [name] [name], female   DOB: 1961-09-24, 60 y.o.   MRN: [id number]   TELEHEALTH GYNECOLOGY VISIT ENCOUNTER NOTE  Provider location: Center for [facility] at [facility].  I connected with NISHA DHAMI on 01/22/21 at  8:50 AM EST by telephone at home and verified that I am speaking with the correct person using two identifiers. Patient was unable to do MyChart audiovisual encounter due to technical difficulties, she tried several times.    I discussed the limitations, risks, security and privacy concerns of performing an evaluation and management service by telephone and the availability of in person appointments. I also discussed with the patient that there may be a patient responsible charge related to this service. The patient expressed understanding and agreed to proceed.   History:  [name] [name] is a 60 y.o. [phone] female being evaluated today for weight and BP check, wants to restart adipex, has been off a while. She denies any unusual headaches  or other concerns.   Has seen Dr Theador Hawthorne and labs are better, to see him in 3 months.  Her Dad died recently and she just had COVID, but is better.      Past Medical History:  Diagnosis Date  . Abnormal Pap smear   . Adult ADHD   . Anxiety   . Arthritis    "knees" (02/10/2018)  . Bipolar disorder ([place])   . Cholelithiases    04/2015  . CKD (chronic kidney disease) 08/15/2020   Saw Dr Theador Hawthorne 08/14/20  . Depression 04/15/2013  . Ear infection 03/19/2016  . Essential hypertension, benign   . ETOH abuse    drinks wine 5 day/week  . Family history of brain cancer   . Family history of BRCA1 gene positive   . Family history of breast cancer   . Family history of ovarian cancer   . Family history of pancreatic cancer   . Family history of stomach cancer   . Family history of thyroid cancer   . GAD (generalized anxiety disorder)   . History of blood transfusion 1980s   "related to miscarriage"  . Incompetent cervix    . Obesity   . OSA on CPAP   . Pneumonia    "used to have it alot; nothing in years" (02/10/2018)  . PTSD (post-traumatic stress disorder)   . Thickened endometrium 05/02/2014  . Vaginal Pap smear, abnormal   . Vertigo 03/19/2016   Past Surgical History:  Procedure Laterality Date  . CERVICAL CERCLAGE    . CHOLECYSTECTOMY N/A 05/24/2015   Procedure: LAPAROSCOPIC CHOLECYSTECTOMY WITH INTRAOPERATIVE CHOLANGIOGRAM;  Surgeon: Aviva Signs Md, MD;  Location: [facility];  Service: General;  Laterality: N/A;  . DILATION AND CURETTAGE OF UTERUS    . JOINT REPLACEMENT    . KNEE ARTHROSCOPY Right 2015  . TONSILLECTOMY    . 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 TOTAL KNEE ARTHROPLASTY Right 02/10/2018   Procedure: TOTAL KNEE ARTHROPLASTY;  Surgeon: Melrose Nakayama, MD;  Location: [place];  Service: Orthopedics;  Laterality: Right;   The following portions of the patient's history were reviewed and updated as appropriate: allergies, current medications, past family history, past medical history, past social history, past surgical history and problem list.   Health Maintenance:  Normal pap and negative HRHPV on 07/06/20.  Normal mammogram on 01/15/18  Review of Systems:  Pertinent items noted in HPI and remainder of comprehensive ROS otherwise negative.  Physical Exam:   General:  Alert, oriented and  cooperative.   Mental Status: Normal mood and affect perceived. Normal judgment and thought content.  Physical exam deferred due to nature of the encounter BP 140/70 (BP Location: Right Arm)   Pulse 67   Ht _0  (1.676 m)   Wt 223 lb (101.2 kg)   BMI 35.99 kg/m    Upstream - 01/22/21 0920      Pregnancy Intention Screening   Does the patient want to become pregnant in the next year? N/A    Does the patient's partner want to become pregnant in the next year? N/A    Would the patient like to discuss contraceptive options today? N/A      Contraception Wrap Up   Current Method No Method - Other  Reason   postmenopausal   End Method No Method - Other Reason   postmenopausal   Contraception Counseling Provided No          Labs and Imaging No results found for this or any previous visit (from the past 336 hour(s)). No results found.    Assessment and Plan:     1. Weight loss counseling, encounter for Will restart adipex Meds ordered this encounter  Medications  . phentermine (ADIPEX-P) 37.5 MG tablet    Sig: Take 1 tablet (37.5 mg total) by mouth daily before breakfast.    Dispense:  30 tablet    Refill:  0    Order Specific Question:   Supervising Provider    Answer:   Elonda Husky, LUTHER H [[id number]]    2. Body mass index 35.0-35.9, adult        I discussed the assessment and treatment plan with the patient. The patient was provided an opportunity to ask questions and all were answered. The patient agreed with the plan and demonstrated an understanding of the instructions.   The patient was advised to call back or seek an in-person evaluation/go to the ED if the symptoms worsen or if the condition fails to improve as anticipated.  I provided 5 minutes of non-face-to-face time during this encounter. I wa sin my office at [facility] tree during this encounter   Derrek Monaco, NP Center for [place]

## 2021-01-30 ENCOUNTER — Telehealth: Payer: Self-pay | Admitting: Adult Health

## 2021-01-30 NOTE — Telephone Encounter (Signed)
[facility] on [address] didn't receive the order for Phentermine 37.5 mg tab #30 take 1 tab by mouth daily before breakfast no refills per [employer]. Pt aware I was going to call pharmacy and give a verbal order if they didn't receive order through Epic. JSY

## 2021-01-30 NOTE — Telephone Encounter (Signed)
Patient stated that [name] [name] didn't send over her diet pill ( didn't know the name)  to the [facility] on [address] in [place]. Per patient. Clinical staff will follow up with patient.

## 2021-05-02 ENCOUNTER — Telehealth: Payer: Self-pay | Admitting: Adult Health

## 2021-05-02 NOTE — Telephone Encounter (Signed)
Patient is over at [facility] now and wants Victorino Dike to put in an order for a tick bite so she can get tested for that as well.

## 2021-05-02 NOTE — Telephone Encounter (Signed)
Front staff advised pt that really don't fall under [place]. She needs to see her PCP. JSY

## 2021-05-16 ENCOUNTER — Telehealth: Payer: Self-pay | Admitting: Adult Health

## 2021-05-16 NOTE — Telephone Encounter (Signed)
[name] [name] is thinking of getting sleeve surgery, will assist any way I can, she will contact [facility].

## 2021-05-16 NOTE — Telephone Encounter (Signed)
Patient would like for [facility] to call her(patient didn't clarify the reason).

## 2021-05-17 ENCOUNTER — Ambulatory Visit
Admission: RE | Admit: 2021-05-17 | Discharge: 2021-05-17 | Disposition: A | Discharge status: Home / Self Care | Payer: Medicare HMO | Source: Ambulatory Visit | Attending: Emergency Medicine | Admitting: Emergency Medicine

## 2021-05-17 ENCOUNTER — Other Ambulatory Visit: Payer: Self-pay

## 2021-05-17 VITALS — BP 162/91 | HR 81 | Temp 98.1°F | Resp 18

## 2021-05-17 DIAGNOSIS — J0140 Acute pansinusitis, unspecified: Secondary | ICD-10-CM | POA: Diagnosis not present

## 2021-05-17 DIAGNOSIS — R059 Cough, unspecified: Secondary | ICD-10-CM

## 2021-05-17 MED ORDER — AMOXICILLIN-POT CLAVULANATE 875-125 MG PO TABS: 1.0000 | ORAL_TABLET | Freq: Two times a day (BID) | ORAL | 0 refills | Status: AC

## 2021-05-17 MED ORDER — BENZONATATE 100 MG PO CAPS: 100.0000 mg | ORAL_CAPSULE | Freq: Three times a day (TID) | ORAL | 0 refills | Status: DC

## 2021-05-17 NOTE — ED Provider Notes (Signed)
[place]   [id number] 05/17/21 Arrival Time: 1200   CC: COVID symptoms  SUBJECTIVE: History from: patient.  [name] [name] is a 60 y.o. female who presents with runny nose, congestion, cough with yellow sputum and fatigue x 2 weeks.  Denies sick exposure to COVID, flu or strep.  Has tried OTC medications without relief.  Symptoms are made worse with at night.  Reports previous symptoms in the past.   Denies fever, chills, SOB, wheezing, chest pain, nausea, changes in bowel or bladder habits.    At home covid test negative  ROS: As per HPI.  All other pertinent ROS negative.     Past Medical History:  Diagnosis Date  . Abnormal Pap smear   . Adult ADHD   . Anxiety   . Arthritis    "knees" (02/10/2018)  . Bipolar disorder ([place])   . Cholelithiases    04/2015  . CKD (chronic kidney disease) 08/15/2020   Saw Dr Theador Hawthorne 08/14/20  . Depression 04/15/2013  . Ear infection 03/19/2016  . Essential hypertension, benign   . ETOH abuse    drinks wine 5 day/week  . Family history of brain cancer   . Family history of BRCA1 gene positive   . Family history of breast cancer   . Family history of ovarian cancer   . Family history of pancreatic cancer   . Family history of stomach cancer   . Family history of thyroid cancer   . GAD (generalized anxiety disorder)   . History of blood transfusion 1980s   "related to miscarriage"  . Incompetent cervix   . Obesity   . OSA on CPAP   . Pneumonia    "used to have it alot; nothing in years" (02/10/2018)  . PTSD (post-traumatic stress disorder)   . Thickened endometrium 05/02/2014  . Vaginal Pap smear, abnormal   . Vertigo 03/19/2016   Past Surgical History:  Procedure Laterality Date  . CERVICAL CERCLAGE    . CHOLECYSTECTOMY N/A 05/24/2015   Procedure: LAPAROSCOPIC CHOLECYSTECTOMY WITH INTRAOPERATIVE CHOLANGIOGRAM;  Surgeon: Aviva Signs Md, MD;  Location: [facility];  Service: General;  Laterality: N/A;  . DILATION AND CURETTAGE OF  UTERUS    . JOINT REPLACEMENT    . KNEE ARTHROSCOPY Right 2015  . TONSILLECTOMY    . 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 TOTAL KNEE ARTHROPLASTY Right 02/10/2018   Procedure: TOTAL KNEE ARTHROPLASTY;  Surgeon: Melrose Nakayama, MD;  Location: [place];  Service: Orthopedics;  Laterality: Right;   No Known Allergies No current facility-administered medications on file prior to encounter.   Current Outpatient Medications on File Prior to Encounter  Medication Sig Dispense Refill  . ALPRAZolam (XANAX) 0.25 MG tablet Take 0.25 mg by mouth at bedtime as needed for sleep.    Marland Kitchen escitalopram (LEXAPRO) 20 MG tablet TAKE 1/2 TABLET BY MOUTH DAILY 90 tablet 3  . hydrochlorothiazide (HYDRODIURIL) 25 MG tablet TAKE 1 TABLET(25 MG) BY MOUTH DAILY 90 tablet 3  . lamoTRIgine (LAMICTAL) 200 MG tablet Take 100-200 mg by mouth See admin instructions. Take 100 mg in the morning and 200 mg in the evening  5  . lisinopril (ZESTRIL) 10 MG tablet TAKE 1 TABLET BY MOUTH DAILY 90 tablet 3  . phentermine (ADIPEX-P) 37.5 MG tablet Take 1 tablet (37.5 mg total) by mouth daily before breakfast. 30 tablet 0   Social History   Socioeconomic History  . Marital status: Married    Spouse name: [name] [name]  .  Number of children: 2  . Years of education: Not on file  . Highest education level: Not on file  Occupational History  . Occupation: Department of Social Services    Employer: [employer]    Comment: retired   Tobacco Use  . Smoking status: Former Smoker    Packs/day: 1.00    Years: 25.00    Pack years: 25.00    Types: Cigarettes    Quit date: 12/30/2005    Years since quitting: 15.3  . Smokeless tobacco: Never Used  Vaping Use  . Vaping Use: Never used  Substance and Sexual Activity  . Alcohol use: Yes    Alcohol/week: 7.0 standard drinks    Types: 5 Glasses of wine, 2 Standard drinks or equivalent per week    Comment: occ  . Drug use: No  . Sexual activity: Yes    Birth  control/protection: Post-menopausal  Other Topics Concern  . Not on file  Social History Narrative  . Not on file   Social Determinants of Health   Financial Resource Strain: Low Risk   . Difficulty of Paying Living Expenses: Not hard at all  Food Insecurity: No Food Insecurity  . Worried About Charity fundraiser in the Last Year: Never true  . Ran Out of Food in the Last Year: Never true  Transportation Needs: No Transportation Needs  . Lack of Transportation (Medical): No  . Lack of Transportation (Non-Medical): No  Physical Activity: Inactive  . Days of Exercise per Week: 0 days  . Minutes of Exercise per Session: 0 min  Stress: No Stress Concern Present  . Feeling of Stress : Only a little  Social Connections: Moderately Integrated  . Frequency of Communication with Friends and Family: More than three times a week  . Frequency of Social Gatherings with Friends and Family: Three times a week  . Attends Religious Services: More than 4 times per year  . Active Member of Clubs or Organizations: No  . Attends Archivist Meetings: Never  . Marital Status: Married  Human resources officer Violence: Not At Risk  . Fear of Current or Ex-Partner: No  . Emotionally Abused: No  . Physically Abused: No  . Sexually Abused: No   Family History  Problem Relation Age of Onset  . Hypertension Father   . Cancer Father 33       sarcoma on face  . Hypertension Mother   . Pancreatic cancer Mother 77  . Asthma Mother   . Sleep apnea Mother   . Coronary artery disease Maternal Grandfather   . Sleep apnea Brother   . Stomach cancer Paternal Aunt        dx in her late 27s  . Stomach cancer Paternal Uncle 68  . Brain cancer Paternal Uncle 47  . Cirrhosis Paternal Grandmother        never drank  . Stroke Maternal Aunt   . Lung cancer Maternal Aunt 73       lung  . Heart attack Maternal Aunt   . Diabetes Other   . Ovarian cancer Maternal Aunt 68       ovarian cancer  . Dementia  Maternal Aunt   . Breast cancer Cousin 25       maternal cousin  . BRCA 1/2 Cousin        BRCA1 positive ([id number])  . Thyroid cancer Paternal Aunt        dx in her 50s/60s  . Cancer Paternal  Uncle        unknown type  . Pancreatic cancer Cousin        dx in his 68s, paternal cousin  . Colon cancer Neg Hx   . Esophageal cancer Neg Hx   . Rectal cancer Neg Hx     OBJECTIVE:  Vitals:   05/17/21 1224  BP: (!) 162/91  Pulse: 81  Resp: 18  Temp: 98.1 F (36.7 C)  TempSrc: Oral  SpO2: 95%     General appearance: alert; appears fatigued, but nontoxic; speaking in full sentences and tolerating own secretions HEENT: NCAT; Ears: EACs clear, TMs pearly gray; Eyes: PERRL.  EOM grossly intact. Nose: nares patent without rhinorrhea, Throat: oropharynx clear, tonsils non erythematous or enlarged, uvula midline  Neck: supple without LAD Lungs: unlabored respirations, symmetrical air entry; cough: mild; no respiratory distress; CTAB Heart: regular rate and rhythm.   Skin: warm and dry Psychological: alert and cooperative; normal mood and affect  ASSESSMENT & PLAN:  1. Acute non-recurrent pansinusitis   2. Cough     Meds ordered this encounter  Medications  . amoxicillin-clavulanate (AUGMENTIN) 875-125 MG tablet    Sig: Take 1 tablet by mouth every 12 (twelve) hours for 10 days.    Dispense:  20 tablet    Refill:  0    Order Specific Question:   Supervising Provider    Answer:   Raylene Everts [[id number]]  . benzonatate (TESSALON) 100 MG capsule    Sig: Take 1 capsule (100 mg total) by mouth every 8 (eight) hours.    Dispense:  21 capsule    Refill:  0    Order Specific Question:   Supervising Provider    Answer:   Raylene Everts [[id number]]    Rest and push fluids Augmentin prescribed.  Take as directed and to completion Tessalon perles for cough Continue with OTC ibuprofen/tylenol as needed for pain Follow up with PCP Return or go to the ED if you have  any new or worsening symptoms such as fever, chills, worsening sinus pain/pressure, cough, sore throat, chest pain, shortness of breath, abdominal pain, changes in bowel or bladder habits, etc...   Reviewed expectations re: course of current medical issues. Questions answered. Outlined signs and symptoms indicating need for more acute intervention. Patient verbalized understanding. After Visit Summary given.         Lestine Box, PA-C 05/17/21 1354

## 2021-05-17 NOTE — ED Triage Notes (Signed)
At home covid test was negative 

## 2021-05-17 NOTE — ED Triage Notes (Signed)
Cough with yellow sputum and nasal congestion x 2 weeks.  Feels tired. Symptoms worse at night

## 2021-05-17 NOTE — Discharge Instructions (Signed)
Rest and push fluids Augmentin prescribed.  Take as directed and to completion Tessalon perles for cough Continue with OTC ibuprofen/tylenol as needed for pain Follow up with PCP Return or go to the ED if you have any new or worsening symptoms such as fever, chills, worsening sinus pain/pressure, cough, sore throat, chest pain, shortness of breath, abdominal pain, changes in bowel or bladder habits, etc..Marland Kitchen

## 2021-06-08 ENCOUNTER — Other Ambulatory Visit: Payer: Self-pay

## 2021-06-08 ENCOUNTER — Ambulatory Visit
Admission: RE | Admit: 2021-06-08 | Discharge: 2021-06-08 | Disposition: A | Discharge status: Home / Self Care | Payer: Medicare HMO | Source: Ambulatory Visit | Attending: Emergency Medicine | Admitting: Emergency Medicine

## 2021-06-08 VITALS — BP 114/67 | HR 76 | Temp 97.7°F | Resp 18

## 2021-06-08 DIAGNOSIS — R0981 Nasal congestion: Secondary | ICD-10-CM | POA: Diagnosis not present

## 2021-06-08 MED ORDER — DOXYCYCLINE HYCLATE 100 MG PO CAPS: 100.0000 mg | ORAL_CAPSULE | Freq: Two times a day (BID) | ORAL | 0 refills | Status: DC

## 2021-06-08 NOTE — ED Provider Notes (Signed)
[place]   [id number] 06/08/21 Arrival Time: 1600   CC: sinus congestion  SUBJECTIVE: History from: patient.  [name] [name] is a 60 y.o. female who presents with ear pressure, sinus congestion, cough, and drainage x 4 days.  Denies sick exposure to COVID, flu or strep.  Has tried OTC medications without relief.  Worse at night.  Reports previous symptoms in the past.   Denies fever, chills, fatigue, rhinorrhea, sore throat, SOB, wheezing, chest pain, nausea, changes in bowel or bladder habits.    ROS: As per HPI.  All other pertinent ROS negative.     Past Medical History:  Diagnosis Date   Abnormal Pap smear    Adult ADHD    Anxiety    Arthritis    "knees" (02/10/2018)   Bipolar disorder ([place])    Cholelithiases    04/2015   CKD (chronic kidney disease) 08/15/2020   Saw Dr Theador Hawthorne 08/14/20   Depression 04/15/2013   Ear infection 03/19/2016   Essential hypertension, benign    ETOH abuse    drinks wine 5 day/week   Family history of brain cancer    Family history of BRCA1 gene positive    Family history of breast cancer    Family history of ovarian cancer    Family history of pancreatic cancer    Family history of stomach cancer    Family history of thyroid cancer    GAD (generalized anxiety disorder)    History of blood transfusion 1980s   "related to miscarriage"   Incompetent cervix    Obesity    OSA on CPAP    Pneumonia    "used to have it alot; nothing in years" (02/10/2018)   PTSD (post-traumatic stress disorder)    Thickened endometrium 05/02/2014   Vaginal Pap smear, abnormal    Vertigo 03/19/2016   Past Surgical History:  Procedure Laterality Date   CERVICAL CERCLAGE     CHOLECYSTECTOMY N/A 05/24/2015   Procedure: LAPAROSCOPIC CHOLECYSTECTOMY WITH INTRAOPERATIVE CHOLANGIOGRAM;  Surgeon: Aviva Signs Md, MD;  Location: [facility];  Service: General;  Laterality: N/A;   DILATION AND CURETTAGE OF UTERUS     JOINT REPLACEMENT     KNEE ARTHROSCOPY Right  2015   TONSILLECTOMY     TOTAL KNEE ARTHROPLASTY Right 02/10/2018   TOTAL KNEE ARTHROPLASTY Right 02/10/2018   Procedure: TOTAL KNEE ARTHROPLASTY;  Surgeon: Melrose Nakayama, MD;  Location: [place];  Service: Orthopedics;  Laterality: Right;   No Known Allergies No current facility-administered medications on file prior to encounter.   Current Outpatient Medications on File Prior to Encounter  Medication Sig Dispense Refill   ALPRAZolam (XANAX) 0.25 MG tablet Take 0.25 mg by mouth at bedtime as needed for sleep.     benzonatate (TESSALON) 100 MG capsule Take 1 capsule (100 mg total) by mouth every 8 (eight) hours. 21 capsule 0   escitalopram (LEXAPRO) 20 MG tablet TAKE 1/2 TABLET BY MOUTH DAILY 90 tablet 3   hydrochlorothiazide (HYDRODIURIL) 25 MG tablet TAKE 1 TABLET(25 MG) BY MOUTH DAILY 90 tablet 3   lamoTRIgine (LAMICTAL) 200 MG tablet Take 100-200 mg by mouth See admin instructions. Take 100 mg in the morning and 200 mg in the evening  5   lisinopril (ZESTRIL) 10 MG tablet TAKE 1 TABLET BY MOUTH DAILY 90 tablet 3   phentermine (ADIPEX-P) 37.5 MG tablet Take 1 tablet (37.5 mg total) by mouth daily before breakfast. 30 tablet 0   Social History   Socioeconomic History  Marital status: Married    Spouse name: [name]   Number of children: 2   Years of education: Not on file   Highest education level: Not on file  Occupational History   Occupation: Department of Social Services    Employer: [employer]    Comment: retired   Tobacco Use   Smoking status: Former    Packs/day: 1.00    Years: 25.00    Pack years: 25.00    Types: Cigarettes    Quit date: 12/30/2005    Years since quitting: 15.4   Smokeless tobacco: Never  Vaping Use   Vaping Use: Never used  Substance and Sexual Activity   Alcohol use: Yes    Alcohol/week: 7.0 standard drinks    Types: 5 Glasses of wine, 2 Standard drinks or equivalent per week    Comment: occ   Drug use: No   Sexual  activity: Yes    Birth control/protection: Post-menopausal  Other Topics Concern   Not on file  Social History Narrative   Not on file   Social Determinants of Health   Financial Resource Strain: Low Risk    Difficulty of Paying Living Expenses: Not hard at all  Food Insecurity: No Food Insecurity   Worried About Charity fundraiser in the Last Year: Never true   Ran Out of Food in the Last Year: Never true  Transportation Needs: No Transportation Needs   Lack of Transportation (Medical): No   Lack of Transportation (Non-Medical): No  Physical Activity: Inactive   Days of Exercise per Week: 0 days   Minutes of Exercise per Session: 0 min  Stress: No Stress Concern Present   Feeling of Stress : Only a little  Social Connections: Moderately Integrated   Frequency of Communication with Friends and Family: More than three times a week   Frequency of Social Gatherings with Friends and Family: Three times a week   Attends Religious Services: More than 4 times per year   Active Member of Clubs or Organizations: No   Attends Archivist Meetings: Never   Marital Status: Married  Human resources officer Violence: Not At Risk   Fear of Current or Ex-Partner: No   Emotionally Abused: No   Physically Abused: No   Sexually Abused: No   Family History  Problem Relation Age of Onset   Hypertension Father    Cancer Father 64       sarcoma on face   Hypertension Mother    Pancreatic cancer Mother 3   Asthma Mother    Sleep apnea Mother    Coronary artery disease Maternal Grandfather    Sleep apnea Brother    Stomach cancer Paternal Aunt        dx in her late 74s   Stomach cancer Paternal Uncle 71   Brain cancer Paternal Uncle 93   Cirrhosis Paternal Grandmother        never drank   Stroke Maternal Aunt    Lung cancer Maternal Aunt 65       lung   Heart attack Maternal Aunt    Diabetes Other    Ovarian cancer Maternal Aunt 68       ovarian cancer   Dementia Maternal Aunt     Breast cancer Cousin 39       maternal cousin   BRCA 1/2 Cousin        BRCA1 positive ([id number])   Thyroid cancer Paternal 81  dx in her 50s/60s   Cancer Paternal Uncle        unknown type   Pancreatic cancer Cousin        dx in his 35s, paternal cousin   Colon cancer Neg Hx    Esophageal cancer Neg Hx    Rectal cancer Neg Hx     OBJECTIVE:  Vitals:   06/08/21 1613  BP: 114/67  Pulse: 76  Resp: 18  Temp: 97.7 F (36.5 C)  TempSrc: Tympanic  SpO2: 94%    General appearance: alert; well-appearing, nontoxic; speaking in full sentences and tolerating own secretions HEENT: NCAT; Ears: EACs clear, TMs pearly gray; Eyes: PERRL.  EOM grossly intact.Nose: nares patent without rhinorrhea, Throat: oropharynx clear, tonsils non erythematous or enlarged, uvula midline  Neck: supple without LAD Lungs: unlabored respirations, symmetrical air entry; cough: absent; no respiratory distress; CTAB Heart: regular rate and rhythm.  Skin: warm and dry Psychological: alert and cooperative; normal mood and affect   ASSESSMENT & PLAN:  1. Sinus congestion     Meds ordered this encounter  Medications   doxycycline (VIBRAMYCIN) 100 MG capsule    Sig: Take 1 capsule (100 mg total) by mouth 2 (two) times daily.    Dispense:  20 capsule    Refill:  0    Order Specific Question:   Supervising Provider    Answer:   Raylene Everts [[id number]]   Rest and push fluids Doxycycline prescribed.  Take as directed and to completion Continue with OTC ibuprofen/tylenol as needed for pain Follow up with PCP or [facility] if symptoms persists Return or go to the ED if you have any new or worsening symptoms such as fever, chills, worsening sinus pain/pressure, cough, sore throat, chest pain, shortness of breath, abdominal pain, changes in bowel or bladder habits, etc...   Reviewed expectations re: course of current medical issues. Questions answered. Outlined signs and symptoms  indicating need for more acute intervention. Patient verbalized understanding. After Visit Summary given.          Lestine Box, PA-C 06/08/21 1622

## 2021-06-08 NOTE — Discharge Instructions (Addendum)
Rest and push fluids Doxycycline prescribed.  Take as directed and to completion Continue with OTC ibuprofen/tylenol as needed for pain Follow up with PCP or [facility] if symptoms persists Return or go to the ED if you have any new or worsening symptoms such as fever, chills, worsening sinus pain/pressure, cough, sore throat, chest pain, shortness of breath, abdominal pain, changes in bowel or bladder habits, etc...  

## 2021-06-08 NOTE — ED Triage Notes (Signed)
Congestion , ear pressure 4-5 days.  Seen x 1 month ago didn't finish abx

## 2021-06-18 ENCOUNTER — Other Ambulatory Visit: Payer: Self-pay | Admitting: Adult Health

## 2021-06-19 ENCOUNTER — Other Ambulatory Visit: Payer: Self-pay | Admitting: Adult Health

## 2021-06-19 MED ORDER — AMOXICILLIN-POT CLAVULANATE 875-125 MG PO TABS: 1.0000 | ORAL_TABLET | Freq: Two times a day (BID) | ORAL | 0 refills | Status: DC

## 2021-06-19 NOTE — Progress Notes (Signed)
Rx Augmentin

## 2021-07-12 ENCOUNTER — Other Ambulatory Visit: Payer: Self-pay

## 2021-07-12 ENCOUNTER — Encounter: Payer: Self-pay | Admitting: Adult Health

## 2021-07-12 ENCOUNTER — Ambulatory Visit (INDEPENDENT_AMBULATORY_CARE_PROVIDER_SITE_OTHER): Payer: Medicare HMO | Admitting: Adult Health

## 2021-07-12 VITALS — BP 135/65 | HR 78 | Ht 66.0 in | Wt 246.5 lb

## 2021-07-12 DIAGNOSIS — N1831 Chronic kidney disease, stage 3a: Secondary | ICD-10-CM | POA: Insufficient documentation

## 2021-07-12 DIAGNOSIS — Z6839 Body mass index (BMI) 39.0-39.9, adult: Secondary | ICD-10-CM

## 2021-07-12 DIAGNOSIS — Z1211 Encounter for screening for malignant neoplasm of colon: Secondary | ICD-10-CM

## 2021-07-12 DIAGNOSIS — Z01419 Encounter for gynecological examination (general) (routine) without abnormal findings: Secondary | ICD-10-CM

## 2021-07-12 DIAGNOSIS — M25552 Pain in left hip: Secondary | ICD-10-CM

## 2021-07-12 DIAGNOSIS — I10 Essential (primary) hypertension: Secondary | ICD-10-CM

## 2021-07-12 DIAGNOSIS — R635 Abnormal weight gain: Secondary | ICD-10-CM | POA: Diagnosis not present

## 2021-07-12 DIAGNOSIS — M25551 Pain in right hip: Secondary | ICD-10-CM

## 2021-07-12 LAB — HEMOCCULT GUIAC POC 1CARD (OFFICE): Fecal Occult Blood, POC: NEGATIVE

## 2021-07-12 NOTE — Progress Notes (Addendum)
Patient ID: [name] [name], female   DOB: November 09, 1961, 60 y.o.   MRN: [id number] History of Present Illness: [name] [name] is a 60 year old white female, married, PM in for a well woman gyn exam.  She is thinking about getting sleeve surgery.  She saw Dr Wolfgang Phoenix today and had labs.  She has had cataract surgery.  Lab Results  Component Value Date   DIAGPAP  07/06/2020    - Negative for intraepithelial lesion or malignancy (NILM)   HPV NOT DETECTED 05/05/2017   HPVHIGH Negative 07/06/2020   PCP is [facility].  Current Medications, Allergies, Past Medical History, Past Surgical History, Family History and Social History were reviewed in [employer] record.     Review of Systems: Patient denies any headaches, hearing loss, fatigue, blurred vision, shortness of breath, chest pain, abdominal pain, problems with bowel movements, urination, or intercourse. No joint pain or mood swings.  Has bilateral hip pain +weight gain     Physical Exam:BP 135/65 (BP Location: Right Arm, Patient Position: Sitting, Cuff Size: Large)   Pulse 78   Ht 5\' 6"  (1.676 m)   Wt 246 lb 8 oz (111.8 kg)   BMI 39.79 kg/m   General:  Well developed, well nourished, no acute distress Skin:  Warm and dry Neck:  Midline trachea, normal thyroid, good ROM, no lymphadenopathy Lungs; Clear to auscultation bilaterally Breast:  No dominant palpable mass, retraction, or nipple discharge Cardiovascular: Regular rate and rhythm Abdomen:  Soft, non tender, no hepatosplenomegaly Pelvic:  External genitalia is normal in appearance, no lesions.  The vagina is normal in appearance. Urethra has no lesions or masses. The cervix is bulbous.  Uterus is felt to be normal size, shape, and contour.  No adnexal masses or tenderness noted.Bladder is non tender, no masses felt. Rectal: Good sphincter tone, no polyps,++ hemorrhoids felt.  Hemoccult negative. Extremities/musculoskeletal:  No swelling or varicosities noted, no  clubbing or cyanosis Psych:  No mood changes, alert and cooperative,seems happy AA is 3 Fall risk is low Depression screen [facility] 2/9 07/12/2021 07/06/2020 04/15/2019  Decreased Interest 2 0 0  Down, Depressed, Hopeless 1 1 0  PHQ - 2 Score 3 1 0  Altered sleeping 1 1 -  Tired, decreased energy 1 2 -  Change in appetite 2 2 -  Feeling bad or failure about yourself  1 0 -  Trouble concentrating 0 0 -  Moving slowly or fidgety/restless 0 2 -  Suicidal thoughts 0 0 -  PHQ-9 Score 8 8 -  Difficult doing work/chores - Not difficult at all -   Is on meds  GAD 7 : Generalized Anxiety Score 07/12/2021 07/06/2020  Nervous, Anxious, on Edge 3 1  Control/stop worrying 2 2  Worry too much - different things 2 2  Trouble relaxing 1 0  Restless 0 2  Easily annoyed or irritable 0 2  Afraid - awful might happen 0 0  Total GAD 7 Score 8 9  Anxiety Difficulty - Not difficult at all    Upstream - 07/12/21 1343       Pregnancy Intention Screening   Does the patient want to become pregnant in the next year? N/A    Does the patient's partner want to become pregnant in the next year? N/A    Would the patient like to discuss contraceptive options today? N/A      Contraception Wrap Up   Current Method --   PM   End Method --  PM   Contraception Counseling Provided No              Pt gave verbal consent for exam without chaperone.  Impression and Plan: 1. Encounter for well woman exam with routine gynecological exam Physical in 1 year Pap 2024 Get mammogram  Labs with Dr Wolfgang Phoenix Colonoscopy per GI  2. Encounter for screening fecal occult blood testing   3. Weight gain Will do papers for [facility]  4. Hypertension, unspecified type Meds per Dr Wolfgang Phoenix  5. Body mass index 39.0-39.9, adult  6. Hip pain, bilateral Has had injections, may go back for another  7. Stage 3a chronic kidney disease (HCC) Follow up with Dr Wolfgang Phoenix as scheduled

## 2021-07-26 ENCOUNTER — Encounter: Payer: Self-pay | Admitting: Adult Health

## 2021-08-12 ENCOUNTER — Other Ambulatory Visit: Payer: Self-pay | Admitting: Adult Health

## 2021-12-11 ENCOUNTER — Ambulatory Visit (INDEPENDENT_AMBULATORY_CARE_PROVIDER_SITE_OTHER): Payer: Medicare HMO | Admitting: Internal Medicine

## 2021-12-11 ENCOUNTER — Encounter: Payer: Self-pay | Admitting: Internal Medicine

## 2021-12-11 ENCOUNTER — Other Ambulatory Visit: Payer: Self-pay

## 2021-12-11 VITALS — BP 138/76 | HR 70 | Resp 18 | Ht 66.0 in | Wt 243.0 lb

## 2021-12-11 DIAGNOSIS — R7303 Prediabetes: Secondary | ICD-10-CM | POA: Insufficient documentation

## 2021-12-11 DIAGNOSIS — E782 Mixed hyperlipidemia: Secondary | ICD-10-CM

## 2021-12-11 DIAGNOSIS — Z23 Encounter for immunization: Secondary | ICD-10-CM | POA: Diagnosis not present

## 2021-12-11 DIAGNOSIS — F3341 Major depressive disorder, recurrent, in partial remission: Secondary | ICD-10-CM

## 2021-12-11 DIAGNOSIS — I10 Essential (primary) hypertension: Secondary | ICD-10-CM

## 2021-12-11 DIAGNOSIS — G4733 Obstructive sleep apnea (adult) (pediatric): Secondary | ICD-10-CM

## 2021-12-11 DIAGNOSIS — Z1231 Encounter for screening mammogram for malignant neoplasm of breast: Secondary | ICD-10-CM

## 2021-12-11 DIAGNOSIS — N1831 Chronic kidney disease, stage 3a: Secondary | ICD-10-CM | POA: Diagnosis not present

## 2021-12-11 DIAGNOSIS — F419 Anxiety disorder, unspecified: Secondary | ICD-10-CM | POA: Diagnosis not present

## 2021-12-11 NOTE — Assessment & Plan Note (Signed)
Diet modification and moderate exercise advised Not a candidate for GLP1 agonist due to family history of thyroid cancer Has tried Phentermine in the past

## 2021-12-11 NOTE — Patient Instructions (Signed)
Please continue taking medications as prescribed.  Please follow low carb diet and perform moderate exercise/walking at least 150 mins/week. 

## 2021-12-11 NOTE — Assessment & Plan Note (Signed)
Lab Results  Component Value Date   HGBA1C 5.7 (H) 05/16/2020   Advised to follow low carb diet

## 2021-12-11 NOTE — Assessment & Plan Note (Signed)
Well-controlled with Lexapro and Lamictal Followed by Psychiatry

## 2021-12-11 NOTE — Assessment & Plan Note (Signed)
BP Readings from Last 1 Encounters:  12/11/21 138/76   Well-controlled with Lisinopril Counseled for compliance with the medications Advised DASH diet and moderate exercise/walking, at least 150 mins/week

## 2021-12-11 NOTE — Progress Notes (Signed)
New Patient Office Visit  Subjective:  Patient ID: LATRAVIA SOUTHGATE, female    DOB: 1961/06/05  Age: 60 y.o. MRN: [id number]  CC:  Chief Complaint  Patient presents with   New Patient (Initial Visit)    New patient was being seen at [place] years ago     HPI Yuliya Nova Marter is a 59 y.o. female with past medical history of HTN, OSA, OA of right knee, CKD stage IIIa, depression with anxiety and morbid obesity who presents for establishing care.  HTN: BP is well-controlled. Takes medications regularly. Patient denies headache, dizziness, chest pain, dyspnea or palpitations.  OSA: Uses CPAP regularly.  Followed by Dr. Elsworth Soho.  Depression with anxiety: She follows up with her psychiatry NP in [place].  She takes Lexapro, Lamictal and as needed Xanax.  She rarely needs Xanax for anxiety/panic episode.  She denies any anhedonia, SI or HI currently.  She has been trying to follow low-carb diet to lose weight.  She has tried phentermine in the past.  She is not a candidate for GLP1 agonist therapy due to family history of thyroid cancer in her aunt.  She has been under a lot of stress recently and has gained some weight back.  She follows up with Dr. Theador Hawthorne for CKD. Her kidney function tests have been improving now. She has h/o prediabetes as well. She denies any dysuria or hematuria.  She has had 2 doses of COVID-vaccine.  She received flu vaccine and PCV20 in the office today.    Past Medical History:  Diagnosis Date   Abnormal Pap smear    Adult ADHD    Anxiety    Arthritis    "knees" (02/10/2018)   Bipolar disorder ([place])    Cholelithiases    04/2015   CKD (chronic kidney disease) 08/15/2020   Saw Dr Theador Hawthorne 08/14/20   Depression 04/15/2013   Ear infection 03/19/2016   Essential hypertension, benign    ETOH abuse    drinks wine 5 day/week   Family history of brain cancer    Family history of BRCA1 gene positive    Family history of breast cancer    Family history of ovarian  cancer    Family history of pancreatic cancer    Family history of stomach cancer    Family history of thyroid cancer    GAD (generalized anxiety disorder)    History of blood transfusion 1980s   "related to miscarriage"   Incompetent cervix    Obesity    OSA on CPAP    Pneumonia    "used to have it alot; nothing in years" (02/10/2018)   PTSD (post-traumatic stress disorder)    Thickened endometrium 05/02/2014   Vaginal Pap smear, abnormal    Vertigo 03/19/2016    Past Surgical History:  Procedure Laterality Date   CATARACT EXTRACTION     CERVICAL CERCLAGE     CHOLECYSTECTOMY N/A 05/24/2015   Procedure: LAPAROSCOPIC CHOLECYSTECTOMY WITH INTRAOPERATIVE CHOLANGIOGRAM;  Surgeon: Aviva Signs Md, MD;  Location: [facility];  Service: General;  Laterality: N/A;   DILATION AND CURETTAGE OF UTERUS     JOINT REPLACEMENT     KNEE ARTHROSCOPY Right 2015   TONSILLECTOMY     TOTAL KNEE ARTHROPLASTY Right 02/10/2018   TOTAL KNEE ARTHROPLASTY Right 02/10/2018   Procedure: TOTAL KNEE ARTHROPLASTY;  Surgeon: Melrose Nakayama, MD;  Location: [place];  Service: Orthopedics;  Laterality: Right;    Family History  Problem Relation Age of Onset   Hypertension  Father    Cancer Father 49       sarcoma on face   Hypertension Mother    Pancreatic cancer Mother 56   Asthma Mother    Sleep apnea Mother    Coronary artery disease Maternal Grandfather    Sleep apnea Brother    Stomach cancer Paternal Aunt        dx in her late 90s   Stomach cancer Paternal Uncle 83   Brain cancer Paternal Uncle 18   Cirrhosis Paternal Grandmother        never drank   Stroke Maternal Aunt    Lung cancer Maternal Aunt 65       lung   Heart attack Maternal Aunt    Diabetes Other    Ovarian cancer Maternal Aunt 68       ovarian cancer   Dementia Maternal Aunt    Breast cancer Cousin 17       maternal cousin   BRCA 1/2 Cousin        BRCA1 positive ([id number])   Thyroid cancer Paternal Aunt        dx in her  57s/60s   Cancer Paternal Uncle        unknown type   Pancreatic cancer Cousin        dx in his 68s, paternal cousin   Colon cancer Neg Hx    Esophageal cancer Neg Hx    Rectal cancer Neg Hx     Social History   Socioeconomic History   Marital status: Married    Spouse name: [name]   Number of children: 2   Years of education: Not on file   Highest education level: Not on file  Occupational History   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    Employer: [employer]    Comment: retired   Tobacco Use   Smoking status: Former    Packs/day: 1.00    Years: 25.00    Pack years: 25.00    Types: Cigarettes    Quit date: 12/30/2005    Years since quitting: 15.9   Smokeless tobacco: Never  Vaping Use   Vaping Use: Never used  Substance and Sexual Activity   Alcohol use: Yes    Alcohol/week: 7.0 standard drinks    Types: 5 Glasses of wine, 2 Standard drinks or equivalent per week    Comment: occ   Drug use: No   Sexual activity: Yes    Birth control/protection: Post-menopausal  Other Topics Concern   Not on file  Social History Narrative   Not on file   Social Determinants of Health   Financial Resource Strain: Low Risk    Difficulty of Paying Living Expenses: Not very hard  Food Insecurity: No Food Insecurity   Worried About Charity fundraiser in the Last Year: Never true   Ran Out of Food in the Last Year: Never true  Transportation Needs: No Transportation Needs   Lack of Transportation (Medical): No   Lack of Transportation (Non-Medical): No  Physical Activity: Inactive   Days of Exercise per Week: 1 day   Minutes of Exercise per Session: 0 min  Stress: No Stress Concern Present   Feeling of Stress : Only a little  Social Connections: Moderately Isolated   Frequency of Communication with Friends and Family: Twice a week   Frequency of Social Gatherings with Friends and Family: Three times a week   Attends Religious Services: Never    Active Member  of Clubs or Organizations: No   Attends Archivist Meetings: Never   Marital Status: Married  Human resources officer Violence: Not At Risk   Fear of Current or Ex-Partner: No   Emotionally Abused: No   Physically Abused: No   Sexually Abused: No    ROS Review of Systems  Constitutional:  Negative for chills and fever.  HENT:  Negative for congestion, sinus pressure, sinus pain and sore throat.   Eyes:  Negative for pain and discharge.  Respiratory:  Negative for cough and shortness of breath.   Cardiovascular:  Negative for chest pain and palpitations.  Gastrointestinal:  Negative for abdominal pain, diarrhea, nausea and vomiting.  Endocrine: Negative for polydipsia and polyuria.  Genitourinary:  Negative for dysuria and hematuria.  Musculoskeletal:  Positive for arthralgias. Negative for neck pain and neck stiffness.  Skin:  Negative for rash.  Neurological:  Negative for dizziness and weakness.  Psychiatric/Behavioral:  Negative for agitation and behavioral problems. The patient is nervous/anxious.    Objective:   Today's Vitals: BP 138/76 (BP Location: Left Arm)    Pulse 70    Resp 18    Ht '5\' 6"'  (1.676 m)    Wt 243 lb 0.6 oz (110.2 kg)    SpO2 100%    BMI 39.23 kg/m   Physical Exam Vitals reviewed.  Constitutional:      General: She is not in acute distress.    Appearance: She is obese. She is not diaphoretic.  HENT:     Head: Normocephalic and atraumatic.     Nose: Nose normal.     Mouth/Throat:     Mouth: Mucous membranes are moist.  Eyes:     General: No scleral icterus.    Extraocular Movements: Extraocular movements intact.  Cardiovascular:     Rate and Rhythm: Normal rate and regular rhythm.     Pulses: Normal pulses.     Heart sounds: Normal heart sounds. No murmur heard. Pulmonary:     Breath sounds: Normal breath sounds. No wheezing or rales.  Musculoskeletal:     Cervical back: Neck supple. No tenderness.     Right lower leg: No  edema.     Left lower leg: No edema.  Skin:    General: Skin is warm.     Findings: No rash.  Neurological:     General: No focal deficit present.     Mental Status: She is alert and oriented to person, place, and time.  Psychiatric:        Mood and Affect: Mood normal.        Behavior: Behavior normal.    Assessment & Plan:   Problem List Items Addressed This Visit       Cardiovascular and Mediastinum   Essential hypertension, benign - Primary    BP Readings from Last 1 Encounters:  12/11/21 138/76  Well-controlled with Lisinopril Counseled for compliance with the medications Advised DASH diet and moderate exercise/walking, at least 150 mins/week        Respiratory   OSA (obstructive sleep apnea)    Uses CPAP regularly Followed by Pulmonology        Genitourinary   Chronic kidney disease, stage 3a ([place])    Last BMP showed GFR of 58, which has improved from 38 in 07/2020 On Lisinopril and HCTZ Followed by Nephrology - Dr Theador Hawthorne Avoid nephrotoxic agents        Other   Anxiety    On Lexapro, Lamictal and PRN Xanax Followed  by Psychiatry NP in [facility]      Depression    Well-controlled with Lexapro and Lamictal Followed by Psychiatry      Morbid obesity ([place])    Diet modification and moderate exercise advised Not a candidate for GLP1 agonist due to family history of thyroid cancer Has tried Phentermine in the past      Prediabetes    Lab Results  Component Value Date   HGBA1C 5.7 (H) 05/16/2020  Advised to follow low carb diet      Mixed hyperlipidemia    Last lipid profile in 2021 Check lipid profile      Relevant Orders   Lipid Profile   Other Visit Diagnoses     Screening mammogram for breast cancer       Relevant Orders   MM 3D SCREEN BREAST BILATERAL   Need for immunization against influenza       Relevant Orders   Flu Vaccine QUAD 69moIM (Fluarix, Fluzone & Alfiuria Quad PF) (Completed)   Need for viral immunization        Relevant Orders   Pneumococcal conjugate vaccine 20-valent (Prevnar 20)       Outpatient Encounter Medications as of 12/11/2021  Medication Sig   ALPRAZolam (XANAX) 0.25 MG tablet Take 0.25 mg by mouth at bedtime as needed for sleep.   escitalopram (LEXAPRO) 20 MG tablet TAKE 1/2 TABLET BY MOUTH DAILY   hydrochlorothiazide (HYDRODIURIL) 25 MG tablet TAKE 1 TABLET(25 MG) BY MOUTH DAILY   lamoTRIgine (LAMICTAL) 200 MG tablet Take 100-200 mg by mouth See admin instructions. Take 100 mg in the morning and 200 mg in the evening   lisinopril (ZESTRIL) 10 MG tablet TAKE 1 TABLET BY MOUTH DAILY   No facility-administered encounter medications on file as of 12/11/2021.    Follow-up: Return in about 6 months (around 06/11/2022) for Annual physical.   RLindell Spar MD

## 2021-12-11 NOTE — Assessment & Plan Note (Signed)
On Lexapro, Lamictal and PRN Xanax Followed by Psychiatry NP in [facility]

## 2021-12-11 NOTE — Assessment & Plan Note (Signed)
Uses CPAP regularly Followed by Pulmonology

## 2021-12-11 NOTE — Assessment & Plan Note (Signed)
Last lipid profile in 2021 Check lipid profile 

## 2021-12-11 NOTE — Assessment & Plan Note (Signed)
Last BMP showed GFR of 58, which has improved from 38 in 07/2020 On Lisinopril and HCTZ Followed by Nephrology - Dr Wolfgang Phoenix Avoid nephrotoxic agents

## 2022-01-03 ENCOUNTER — Ambulatory Visit (HOSPITAL_COMMUNITY): Payer: Medicare HMO

## 2022-01-15 ENCOUNTER — Telehealth: Payer: Self-pay

## 2022-01-15 NOTE — Telephone Encounter (Signed)
Pt is not currently taking medicine will need an appointment can be virtual if she can weigh at home but will need this weight before visit please arrange

## 2022-01-15 NOTE — Telephone Encounter (Signed)
Left voicemail for patient to call office to schedule virtual visit and to weigh herself at home.

## 2022-01-15 NOTE — Telephone Encounter (Signed)
Patient called asked if Dr Allena Katz will send in a prescription for [facility] to her pharmacy:    Patient said there is $25  coupon, can she get this coupon also.  Pharmacy: [facility]

## 2022-01-17 ENCOUNTER — Other Ambulatory Visit: Payer: Self-pay

## 2022-01-17 ENCOUNTER — Encounter: Payer: Self-pay | Admitting: Internal Medicine

## 2022-01-17 ENCOUNTER — Ambulatory Visit (INDEPENDENT_AMBULATORY_CARE_PROVIDER_SITE_OTHER): Payer: Medicare HMO | Admitting: Internal Medicine

## 2022-01-17 MED ORDER — PHENTERMINE HCL 15 MG PO CAPS: 15.0000 mg | ORAL_CAPSULE | ORAL | 1 refills | Status: DC

## 2022-01-17 NOTE — Assessment & Plan Note (Signed)
BMI Readings from Last 2 Encounters:  01/17/22 39.22 kg/m  12/11/21 39.23 kg/m   Diet modification and moderate exercise advised Unable to lose weight with lifestyle modification alone Started Phentermine 15 mg QD Unable to start Mounjaro or Ozempic as they are not approved for weight loss alone currently

## 2022-01-17 NOTE — Progress Notes (Signed)
Virtual Visit via Telephone Note   This visit type was conducted due to national recommendations for restrictions regarding the COVID-19 Pandemic (e.g. social distancing) in an effort to limit this patient's exposure and mitigate transmission in our community.  Due to her co-morbid illnesses, this patient is at least at moderate risk for complications without adequate follow up.  This format is felt to be most appropriate for this patient at this time.  The patient did not have access to video technology/had technical difficulties with video requiring transitioning to audio format only (telephone).  All issues noted in this document were discussed and addressed.  No physical exam could be performed with this format.  Evaluation Performed:  Follow-up visit  Date:  01/17/2022   ID:  [name] [name], DOB 1961-03-31, MRN [id number]  Patient Location: Home Provider Location: Office/Clinic  Participants: Patient Location of Patient: Home Location of Provider: Telehealth Consent was obtain for visit to be over via telehealth. I verified that I am speaking with the correct person using two identifiers.  PCP:  Lindell Spar, MD   Chief Complaint: Weight management  History of Present Illness:    [name] [name] is a 61 y.o. female who has a televisit for discussing weight loss therapy.  She has been trying low-carb diet and regular exercises for weight loss, but has not been able to lose weight.  She has tried phentermine in the past and tolerated well.  Today, she reports that she does not have family history of thyroid cancer - her aunt's step sister has thyroid problem and asks about Mounjaro. I advised her that Darcel Bayley is only approved for DM and not for prediabetes or weight loss alone for now.  The patient does not have symptoms concerning for COVID-19 infection (fever, chills, cough, or new shortness of breath).   Past Medical, Surgical, Social History, Allergies, and Medications have  been Reviewed.  Past Medical History:  Diagnosis Date   Abnormal Pap smear    Adult ADHD    Anxiety    Arthritis    "knees" (02/10/2018)   Bipolar disorder ([place])    Cholelithiases    04/2015   CKD (chronic kidney disease) 08/15/2020   Saw Dr Theador Hawthorne 08/14/20   Depression 04/15/2013   Ear infection 03/19/2016   Essential hypertension, benign    ETOH abuse    drinks wine 5 day/week   Family history of brain cancer    Family history of BRCA1 gene positive    Family history of breast cancer    Family history of ovarian cancer    Family history of pancreatic cancer    Family history of stomach cancer    Family history of thyroid cancer    GAD (generalized anxiety disorder)    History of blood transfusion 1980s   "related to miscarriage"   Incompetent cervix    Obesity    OSA on CPAP    Pneumonia    "used to have it alot; nothing in years" (02/10/2018)   PTSD (post-traumatic stress disorder)    Thickened endometrium 05/02/2014   Vaginal Pap smear, abnormal    Vertigo 03/19/2016   Past Surgical History:  Procedure Laterality Date   CATARACT EXTRACTION     CERVICAL CERCLAGE     CHOLECYSTECTOMY N/A 05/24/2015   Procedure: LAPAROSCOPIC CHOLECYSTECTOMY WITH INTRAOPERATIVE CHOLANGIOGRAM;  Surgeon: Aviva Signs Md, MD;  Location: [facility];  Service: General;  Laterality: N/A;   DILATION AND CURETTAGE OF UTERUS  JOINT REPLACEMENT     KNEE ARTHROSCOPY Right 2015   TONSILLECTOMY     TOTAL KNEE ARTHROPLASTY Right 02/10/2018   TOTAL KNEE ARTHROPLASTY Right 02/10/2018   Procedure: TOTAL KNEE ARTHROPLASTY;  Surgeon: Melrose Nakayama, MD;  Location: [place];  Service: Orthopedics;  Laterality: Right;     Current Meds  Medication Sig   ALPRAZolam (XANAX) 0.25 MG tablet Take 0.25 mg by mouth at bedtime as needed for sleep.   escitalopram (LEXAPRO) 20 MG tablet TAKE 1/2 TABLET BY MOUTH DAILY   hydrochlorothiazide (HYDRODIURIL) 25 MG tablet TAKE 1 TABLET(25 MG) BY MOUTH DAILY   lamoTRIgine  (LAMICTAL) 200 MG tablet Take 100-200 mg by mouth See admin instructions. Take 100 mg in the morning and 200 mg in the evening   lisinopril (ZESTRIL) 10 MG tablet TAKE 1 TABLET BY MOUTH DAILY   phentermine 15 MG capsule Take 1 capsule (15 mg total) by mouth every morning.     Allergies:   Patient has no known allergies.   ROS:   Please see the history of present illness.     All other systems reviewed and are negative.   Labs/Other Tests and Data Reviewed:    Recent Labs: No results found for requested labs within last 8760 hours.   Recent Lipid Panel Lab Results  Component Value Date/Time   CHOL 263 (H) 05/16/2020 10:06 AM   TRIG 185 (H) 05/16/2020 10:06 AM   HDL 51 05/16/2020 10:06 AM   CHOLHDL 5.2 (H) 05/16/2020 10:06 AM   CHOLHDL 4.9 08/31/2014 10:20 AM   LDLCALC 178 (H) 05/16/2020 10:06 AM    Wt Readings from Last 3 Encounters:  01/17/22 243 lb (110.2 kg)  12/11/21 243 lb 0.6 oz (110.2 kg)  07/12/21 246 lb 8 oz (111.8 kg)     ASSESSMENT & PLAN:    Morbid obesity (HCC) BMI Readings from Last 2 Encounters:  01/17/22 39.22 kg/m  12/11/21 39.23 kg/m   Diet modification and moderate exercise advised Unable to lose weight with lifestyle modification alone Started Phentermine 15 mg QD Unable to start Mounjaro or Ozempic as they are not approved for weight loss alone currently    Time:   Today, I have spent 12 minutes reviewing the chart, including problem list, medications, and with the patient with telehealth technology discussing the above problems.   Medication Adjustments/Labs and Tests Ordered: Current medicines are reviewed at length with the patient today.  Concerns regarding medicines are outlined above.   Tests Ordered: No orders of the defined types were placed in this encounter.   Medication Changes: Meds ordered this encounter  Medications   phentermine 15 MG capsule    Sig: Take 1 capsule (15 mg total) by mouth every morning.    Dispense:   30 capsule    Refill:  1     Note: This dictation was prepared with Dragon dictation along with smaller phrase technology. Similar sounding words can be transcribed inadequately or may not be corrected upon review. Any transcriptional errors that result from this process are unintentional.      Disposition:  Follow up  Signed, Lindell Spar, MD  01/17/2022 1:12 PM     [facility]

## 2022-01-17 NOTE — Patient Instructions (Signed)
Please start taking Phertermine as prescribed.  Please continue to follow low carb diet and perform moderate exercise/walking at least 150 mins/week.

## 2022-01-25 ENCOUNTER — Telehealth: Payer: Self-pay | Admitting: Internal Medicine

## 2022-01-25 ENCOUNTER — Other Ambulatory Visit: Payer: Self-pay

## 2022-01-25 MED ORDER — PHENTERMINE HCL 15 MG PO CAPS: 15.0000 mg | ORAL_CAPSULE | ORAL | 1 refills | Status: DC

## 2022-01-25 NOTE — Telephone Encounter (Signed)
Called and spoke to [facility] verified they do have the rx

## 2022-01-25 NOTE — Telephone Encounter (Signed)
Pt called in regard to prescription   Pt states that provider was going to send prescription in for her and pharm has not received med.  Pt would like a call back in regard

## 2022-02-19 ENCOUNTER — Other Ambulatory Visit: Payer: Self-pay

## 2022-02-19 ENCOUNTER — Ambulatory Visit (INDEPENDENT_AMBULATORY_CARE_PROVIDER_SITE_OTHER): Payer: Medicare HMO | Admitting: Internal Medicine

## 2022-02-19 ENCOUNTER — Encounter: Payer: Self-pay | Admitting: Internal Medicine

## 2022-02-19 DIAGNOSIS — J069 Acute upper respiratory infection, unspecified: Secondary | ICD-10-CM

## 2022-02-19 MED ORDER — AZITHROMYCIN 250 MG PO TABS: ORAL_TABLET | ORAL | 0 refills | Status: AC

## 2022-02-19 MED ORDER — BENZONATATE 100 MG PO CAPS: 100.0000 mg | ORAL_CAPSULE | Freq: Two times a day (BID) | ORAL | 0 refills | Status: DC | PRN

## 2022-02-19 NOTE — Progress Notes (Signed)
Virtual Visit via Telephone Note   This visit type was conducted due to national recommendations for restrictions regarding the COVID-19 Pandemic (e.g. social distancing) in an effort to limit this patient's exposure and mitigate transmission in our community.  Due to her co-morbid illnesses, this patient is at least at moderate risk for complications without adequate follow up.  This format is felt to be most appropriate for this patient at this time.  The patient did not have access to video technology/had technical difficulties with video requiring transitioning to audio format only (telephone).  All issues noted in this document were discussed and addressed.  No physical exam could be performed with this format.   Evaluation Performed:  Follow-up visit  Date:  02/19/2022   ID:  [name], [name] October 29, 1961, MRN [id number]  Patient Location: Home Provider Location: Office/Clinic  Participants: Patient Location of Patient: Home Location of Provider: Telehealth Consent was obtain for visit to be over via telehealth. I verified that I am speaking with the correct person using two identifiers.  PCP:  Lindell Spar, MD   Chief Complaint: Cough  History of Present Illness:    [name] [name] is a 61 y.o. female who has a televisit for c/o cough and low-grade fever for the last 5 days.  Her home COVID test was negative.  She has tried taking OTC cough medicines with minimal relief.  She currently denies any dyspnea or wheezing.  The patient does not have symptoms concerning for COVID-19 infection (fever, chills, cough, or new shortness of breath).   Past Medical, Surgical, Social History, Allergies, and Medications have been Reviewed.  Past Medical History:  Diagnosis Date   Abnormal Pap smear    Adult ADHD    Anxiety    Arthritis    "knees" (02/10/2018)   Bipolar disorder ([place])    Cholelithiases    04/2015   CKD (chronic kidney disease) 08/15/2020   Saw Dr Theador Hawthorne 08/14/20    Depression 04/15/2013   Ear infection 03/19/2016   Essential hypertension, benign    ETOH abuse    drinks wine 5 day/week   Family history of brain cancer    Family history of BRCA1 gene positive    Family history of breast cancer    Family history of ovarian cancer    Family history of pancreatic cancer    Family history of stomach cancer    Family history of thyroid cancer    GAD (generalized anxiety disorder)    History of blood transfusion 1980s   "related to miscarriage"   Incompetent cervix    Obesity    OSA on CPAP    Pneumonia    "used to have it alot; nothing in years" (02/10/2018)   PTSD (post-traumatic stress disorder)    Thickened endometrium 05/02/2014   Vaginal Pap smear, abnormal    Vertigo 03/19/2016   Past Surgical History:  Procedure Laterality Date   CATARACT EXTRACTION     CERVICAL CERCLAGE     CHOLECYSTECTOMY N/A 05/24/2015   Procedure: LAPAROSCOPIC CHOLECYSTECTOMY WITH INTRAOPERATIVE CHOLANGIOGRAM;  Surgeon: Aviva Signs Md, MD;  Location: [facility];  Service: General;  Laterality: N/A;   DILATION AND CURETTAGE OF UTERUS     JOINT REPLACEMENT     KNEE ARTHROSCOPY Right 2015   TONSILLECTOMY     TOTAL KNEE ARTHROPLASTY Right 02/10/2018   TOTAL KNEE ARTHROPLASTY Right 02/10/2018   Procedure: TOTAL KNEE ARTHROPLASTY;  Surgeon: Melrose Nakayama, MD;  Location: [facility];  Service: Orthopedics;  Laterality: Right;     Current Meds  Medication Sig   ALPRAZolam (XANAX) 0.25 MG tablet Take 0.25 mg by mouth at bedtime as needed for sleep.   azithromycin (ZITHROMAX) 250 MG tablet Take 2 tablets on day 1, then 1 tablet daily on days 2 through 5   benzonatate (TESSALON) 100 MG capsule Take 1 capsule (100 mg total) by mouth 2 (two) times daily as needed for cough.   escitalopram (LEXAPRO) 20 MG tablet TAKE 1/2 TABLET BY MOUTH DAILY   hydrochlorothiazide (HYDRODIURIL) 25 MG tablet TAKE 1 TABLET(25 MG) BY MOUTH DAILY   lamoTRIgine (LAMICTAL) 200 MG tablet Take 100-200 mg by  mouth See admin instructions. Take 100 mg in the morning and 200 mg in the evening   lisinopril (ZESTRIL) 10 MG tablet TAKE 1 TABLET BY MOUTH DAILY   phentermine 15 MG capsule Take 1 capsule (15 mg total) by mouth every morning.     Allergies:   Patient has no known allergies.   ROS:   Please see the history of present illness.     All other systems reviewed and are negative.   Labs/Other Tests and Data Reviewed:    Recent Labs: No results found for requested labs within last 8760 hours.   Recent Lipid Panel Lab Results  Component Value Date/Time   CHOL 263 (H) 05/16/2020 10:06 AM   TRIG 185 (H) 05/16/2020 10:06 AM   HDL 51 05/16/2020 10:06 AM   CHOLHDL 5.2 (H) 05/16/2020 10:06 AM   CHOLHDL 4.9 08/31/2014 10:20 AM   LDLCALC 178 (H) 05/16/2020 10:06 AM    Wt Readings from Last 3 Encounters:  01/17/22 243 lb (110.2 kg)  12/11/21 243 lb 0.6 oz (110.2 kg)  07/12/21 246 lb 8 oz (111.8 kg)     ASSESSMENT & PLAN:    URTI Started empiric azithromycin as she has persistent symptoms despite symptomatic treatment Tessalon as needed for cough Continue Mucinex or Robitussin as needed for cough and congestion Advised to maintain adequate hydration  Time:   Today, I have spent 9 minutes reviewing the chart, including problem list, medications, and with the patient with telehealth technology discussing the above problems.   Medication Adjustments/Labs and Tests Ordered: Current medicines are reviewed at length with the patient today.  Concerns regarding medicines are outlined above.   Tests Ordered: No orders of the defined types were placed in this encounter.   Medication Changes: Meds ordered this encounter  Medications   azithromycin (ZITHROMAX) 250 MG tablet    Sig: Take 2 tablets on day 1, then 1 tablet daily on days 2 through 5    Dispense:  6 tablet    Refill:  0   benzonatate (TESSALON) 100 MG capsule    Sig: Take 1 capsule (100 mg total) by mouth 2 (two) times  daily as needed for cough.    Dispense:  20 capsule    Refill:  0     Note: This dictation was prepared with Dragon dictation along with smaller phrase technology. Similar sounding words can be transcribed inadequately or may not be corrected upon review. Any transcriptional errors that result from this process are unintentional.      Disposition:  Follow up  Signed, Lindell Spar, MD  02/19/2022 10:48 AM     [place]

## 2022-03-05 ENCOUNTER — Ambulatory Visit (INDEPENDENT_AMBULATORY_CARE_PROVIDER_SITE_OTHER): Payer: Medicare HMO | Admitting: Family Medicine

## 2022-03-05 ENCOUNTER — Other Ambulatory Visit: Payer: Self-pay

## 2022-03-05 DIAGNOSIS — Z0000 Encounter for general adult medical examination without abnormal findings: Secondary | ICD-10-CM | POA: Diagnosis not present

## 2022-03-05 NOTE — Patient Instructions (Signed)
?  [name] [name] , ?Thank you for taking time to come for your Medicare Wellness Visit. I appreciate your ongoing commitment to your health goals. Please review the following plan we discussed and let me know if I can assist you in the future.  ? ?These are the goals we discussed: ? Goals   ?None ?  ?  ?This is a list of the screening recommended for you and due dates:  ?Health Maintenance  ?Topic Date Due  ? Tetanus Vaccine  Never done  ? Zoster (Shingles) Vaccine (1 of 2) Never done  ? Mammogram  01/16/2020  ? COVID-19 Vaccine (3 - Moderna risk series) 05/17/2020  ? Colon Cancer Screening  07/14/2023*  ? Pap Smear  07/07/2023  ? Flu Shot  Completed  ? Hepatitis C Screening: USPSTF Recommendation to screen - Ages 84-79 yo.  Completed  ? HIV Screening  Completed  ? HPV Vaccine  Aged Out  ?*Topic was postponed. The date shown is not the original due date.  ?  ?

## 2022-03-05 NOTE — Progress Notes (Signed)
Subjective:   [name] [name] is a 61 y.o. female who presents for Medicare Annual (Subsequent) preventive examination.  Review of Systems    I connected with  [name] [name] on 03/05/22 by a audio enabled telemedicine application and verified that I am speaking with the correct person using two identifiers.  Patient Location: Home  Provider Location: Office/Clinic  I discussed the limitations of evaluation and management by telemedicine. The patient expressed understanding and agreed to proceed.        Objective:    There were no vitals filed for this visit. There is no height or weight on file to calculate BMI.  Advanced Directives 03/20/2018 03/13/2018 03/11/2018 02/26/2018 02/10/2018 02/03/2018 05/20/2017  Does Patient Have a Medical Advance Directive? No No No No No No No  Would patient like information on creating a medical advance directive? No - Patient declined No - Patient declined No - Patient declined No - Patient declined No - Patient declined - No - Patient declined    Current Medications (verified) Outpatient Encounter Medications as of 03/05/2022  Medication Sig   ALPRAZolam (XANAX) 0.25 MG tablet Take 0.25 mg by mouth at bedtime as needed for sleep.   benzonatate (TESSALON) 100 MG capsule Take 1 capsule (100 mg total) by mouth 2 (two) times daily as needed for cough.   escitalopram (LEXAPRO) 20 MG tablet TAKE 1/2 TABLET BY MOUTH DAILY   hydrochlorothiazide (HYDRODIURIL) 25 MG tablet TAKE 1 TABLET(25 MG) BY MOUTH DAILY   lamoTRIgine (LAMICTAL) 200 MG tablet Take 100-200 mg by mouth See admin instructions. Take 100 mg in the morning and 200 mg in the evening   lisinopril (ZESTRIL) 10 MG tablet TAKE 1 TABLET BY MOUTH DAILY   phentermine 15 MG capsule Take 1 capsule (15 mg total) by mouth every morning.   No facility-administered encounter medications on file as of 03/05/2022.    Allergies (verified) Patient has no known allergies.   History: Past Medical History:   Diagnosis Date   Abnormal Pap smear    Adult ADHD    Anxiety    Arthritis    "knees" (02/10/2018)   Bipolar disorder ([place])    Cholelithiases    04/2015   CKD (chronic kidney disease) 08/15/2020   Saw Dr Theador Hawthorne 08/14/20   Depression 04/15/2013   Ear infection 03/19/2016   Essential hypertension, benign    ETOH abuse    drinks wine 5 day/week   Family history of brain cancer    Family history of BRCA1 gene positive    Family history of breast cancer    Family history of ovarian cancer    Family history of pancreatic cancer    Family history of stomach cancer    Family history of thyroid cancer    GAD (generalized anxiety disorder)    History of blood transfusion 1980s   "related to miscarriage"   Incompetent cervix    Obesity    OSA on CPAP    Pneumonia    "used to have it alot; nothing in years" (02/10/2018)   PTSD (post-traumatic stress disorder)    Thickened endometrium 05/02/2014   Vaginal Pap smear, abnormal    Vertigo 03/19/2016   Past Surgical History:  Procedure Laterality Date   CATARACT EXTRACTION     CERVICAL CERCLAGE     CHOLECYSTECTOMY N/A 05/24/2015   Procedure: LAPAROSCOPIC CHOLECYSTECTOMY WITH INTRAOPERATIVE CHOLANGIOGRAM;  Surgeon: Aviva Signs Md, MD;  Location: [facility];  Service: General;  Laterality: N/A;   DILATION  AND CURETTAGE OF UTERUS     JOINT REPLACEMENT     KNEE ARTHROSCOPY Right 2015   TONSILLECTOMY     TOTAL KNEE ARTHROPLASTY Right 02/10/2018   TOTAL KNEE ARTHROPLASTY Right 02/10/2018   Procedure: TOTAL KNEE ARTHROPLASTY;  Surgeon: Melrose Nakayama, MD;  Location: [facility];  Service: Orthopedics;  Laterality: Right;   Family History  Problem Relation Age of Onset   Hypertension Father    Cancer Father 32       sarcoma on face   Hypertension Mother    Pancreatic cancer Mother 80   Asthma Mother    Sleep apnea Mother    Coronary artery disease Maternal Grandfather    Sleep apnea Brother    Stomach cancer Paternal Aunt        dx in her late  44s   Stomach cancer Paternal Uncle 21   Brain cancer Paternal Uncle 82   Cirrhosis Paternal Grandmother        never drank   Stroke Maternal Aunt    Lung cancer Maternal Aunt 65       lung   Heart attack Maternal Aunt    Diabetes Other    Ovarian cancer Maternal Aunt 68       ovarian cancer   Dementia Maternal Aunt    Breast cancer Cousin 59       maternal cousin   BRCA 1/2 Cousin        BRCA1 positive ([id number])   Thyroid cancer Paternal Aunt        dx in her 50s/60s   Cancer Paternal Uncle        unknown type   Pancreatic cancer Cousin        dx in his 38s, paternal cousin   Colon cancer Neg Hx    Esophageal cancer Neg Hx    Rectal cancer Neg Hx    Social History   Socioeconomic History   Marital status: Married    Spouse name: [name] [name]   Number of children: 2   Years of education: Not on file   Highest education level: Not on file  Occupational History   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    Employer: [employer]    Comment: retired   Tobacco Use   Smoking status: Former    Packs/day: 1.00    Years: 25.00    Pack years: 25.00    Types: Cigarettes    Quit date: 12/30/2005    Years since quitting: 16.1   Smokeless tobacco: Never  Vaping Use   Vaping Use: Never used  Substance and Sexual Activity   Alcohol use: Yes    Alcohol/week: 7.0 standard drinks    Types: 5 Glasses of wine, 2 Standard drinks or equivalent per week    Comment: occ   Drug use: No   Sexual activity: Yes    Birth control/protection: Post-menopausal  Other Topics Concern   Not on file  Social History Narrative   Not on file   Social Determinants of Health   Financial Resource Strain: Low Risk    Difficulty of Paying Living Expenses: Not very hard  Food Insecurity: No Food Insecurity   Worried About Charity fundraiser in the Last Year: Never true   Ran Out of Food in the Last Year: Never true  Transportation Needs: No Transportation Needs    Lack of Transportation (Medical): No   Lack of Transportation (Non-Medical): No  Physical Activity: Inactive   Days of  Exercise per Week: 1 day   Minutes of Exercise per Session: 0 min  Stress: No Stress Concern Present   Feeling of Stress : Only a little  Social Connections: Moderately Isolated   Frequency of Communication with Friends and Family: Twice a week   Frequency of Social Gatherings with Friends and Family: Three times a week   Attends Religious Services: Never   Active Member of Clubs or Organizations: No   Attends Archivist Meetings: Never   Marital Status: Married    Tobacco Counseling Counseling given: Not Answered   Clinical Intake:                 Diabetic?no         Activities of Daily Living No flowsheet data found.  Patient Care Team: [name] Spar, MD as PCP - General (Internal Medicine)  Indicate any recent Medical Services you may have received from other than [facility] providers in the past year (date may be approximate).     Assessment:   This is a routine wellness examination for [name] [name].  Hearing/Vision screen No results found.  Dietary issues and exercise activities discussed:     Goals Addressed   None    Depression Screen PHQ 2/9 Scores 02/19/2022 01/17/2022 12/11/2021 07/12/2021 07/06/2020 04/15/2019 12/03/2018  PHQ - 2 Score 0 0 '1 3 1 ' 0 0  PHQ- 9 Score 0 - - 8 8 - -    Fall Risk Fall Risk  02/19/2022 01/17/2022 12/11/2021 07/12/2021 01/22/2021  Falls in the past year? 0 0 0 0 0  Number falls in past yr: 0 0 0 0 -  Injury with Fall? 0 0 0 0 -  Risk for fall due to : No Fall Risks No Fall Risks No Fall Risks No Fall Risks -  Follow up Falls evaluation completed Falls evaluation completed Falls evaluation completed Falls evaluation completed -    FALL RISK PREVENTION PERTAINING TO THE HOME:  Any stairs in or around the home? Yes  If so, are there any without handrails? No  Home free of loose throw rugs in walkways,  pet beds, electrical cords, etc? Yes  Adequate lighting in your home to reduce risk of falls? Yes   ASSISTIVE DEVICES UTILIZED TO PREVENT FALLS:  Life alert? No  Use of a cane, walker or w/c? No  Grab bars in the bathroom? No  Shower chair or bench in shower? No  Elevated toilet seat or a handicapped toilet? No     Cognitive Function:        Immunizations Immunization History  Administered Date(s) Administered   Influenza Split 09/29/2017   Influenza Whole 09/30/2011, 09/29/2012   Influenza, Quadrivalent, Recombinant, Inj, Pf 09/23/2019   Influenza,inj,Quad PF,6+ Mos 12/11/2021   Moderna Sars-Covid-2 Vaccination 03/17/2020, 04/19/2020   PNEUMOCOCCAL CONJUGATE-20 12/11/2021    TDAP status: Due, Education has been provided regarding the importance of this vaccine. Advised may receive this vaccine at local pharmacy or Health Dept. Aware to provide a copy of the vaccination record if obtained from local pharmacy or Health Dept. Verbalized acceptance and understanding.  Flu Vaccine status: Up to date  Pneumococcal vaccine status: Declined,  Education has been provided regarding the importance of this vaccine but patient still declined. Advised may receive this vaccine at local pharmacy or Health Dept. Aware to provide a copy of the vaccination record if obtained from local pharmacy or Health Dept. Verbalized acceptance and understanding.   Covid-19 vaccine status: Completed vaccines  Qualifies  for Shingles Vaccine? Yes   Zostavax completed No   Shingrix Completed?: No.    Education has been provided regarding the importance of this vaccine. Patient has been advised to call insurance company to determine out of pocket expense if they have not yet received this vaccine. Advised may also receive vaccine at local pharmacy or Health Dept. Verbalized acceptance and understanding.  Screening Tests Health Maintenance  Topic Date Due   TETANUS/TDAP  Never done   Zoster Vaccines-  Shingrix (1 of 2) Never done   MAMMOGRAM  01/16/2020   COVID-19 Vaccine (3 - Moderna risk series) 05/17/2020   COLONOSCOPY (Pts 45-70yr Insurance coverage will need to be confirmed)  07/14/2023 (Originally 07/23/2018)   PAP SMEAR-Modifier  07/07/2023   INFLUENZA VACCINE  Completed   Hepatitis C Screening  Completed   HIV Screening  Completed   HPV VACCINES  Aged Out    Health Maintenance  Health Maintenance Due  Topic Date Due   TETANUS/TDAP  Never done   Zoster Vaccines- Shingrix (1 of 2) Never done   MAMMOGRAM  01/16/2020   COVID-19 Vaccine (3 - Moderna risk series) 05/17/2020    Colorectal cancer screening: Type of screening: Colonoscopy. Completed 07/23/2013. Repeat every 10 years  Mammogram status: Completed 01/15/2018. Repeat every year  Bone Density Status: not yet due   Lung Cancer Screening: (Low Dose CT Chest recommended if Age 60-80years, 30 pack-year currently smoking OR have quit w/in 15years.) does not qualify.   Lung Cancer Screening Referral: n/a  Additional Screening:  Hepatitis C Screening: does qualify; Completed 05/23/2017  Vision Screening: Recommended annual ophthalmology exams for early detection of glaucoma and other disorders of the eye. Is the patient up to date with their annual eye exam?  Yes  01/2022 Who is the provider or what is the name of the office in which the patient attends annual eye exams? [facility] in [place] If pt is not established with a provider, would they like to be referred to a provider to establish care?  N/a .   Dental Screening: Recommended annual dental exams for proper oral hygiene      Plan:     I have personally reviewed and noted the following in the patients chart:   Medical and social history Use of alcohol, tobacco or illicit drugs  Current medications and supplements including opioid prescriptions.  Functional ability and status Nutritional status Physical activity Advanced  directives List of other physicians Hospitalizations, surgeries, and ER visits in previous 12 months Vitals Screenings to include cognitive, depression, and falls Referrals and appointments  In addition, I have reviewed and discussed with patient certain preventive protocols, quality metrics, and best practice recommendations. A written personalized care plan for preventive services as well as general preventive health recommendations were provided to patient.     GKathlyn Sacramento RN   03/05/2022   Nurse Notes:  Ms. TMeller, Thank you for taking time to come for your Medicare Wellness Visit. I appreciate your ongoing commitment to your health goals. Please review the following plan we discussed and let me know if I can assist you in the future.   These are the goals we discussed:  Goals   None     This is a list of the screening recommended for you and due dates:  Health Maintenance  Topic Date Due   Tetanus Vaccine  Never done   Zoster (Shingles) Vaccine (1 of 2) Never done   Mammogram  01/16/2020  COVID-19 Vaccine (3 - Moderna risk series) 05/17/2020   Colon Cancer Screening  07/14/2023*   Pap Smear  07/07/2023   Flu Shot  Completed   Hepatitis C Screening: USPSTF Recommendation to screen - Ages 18-79 yo.  Completed   HIV Screening  Completed   HPV Vaccine  Aged Out  *Topic was postponed. The date shown is not the original due date.

## 2022-03-21 ENCOUNTER — Ambulatory Visit: Payer: Medicare HMO | Admitting: Internal Medicine

## 2022-05-08 ENCOUNTER — Encounter: Payer: Self-pay | Admitting: Internal Medicine

## 2022-05-08 ENCOUNTER — Ambulatory Visit (INDEPENDENT_AMBULATORY_CARE_PROVIDER_SITE_OTHER): Payer: Medicare HMO | Admitting: Internal Medicine

## 2022-05-08 DIAGNOSIS — R7303 Prediabetes: Secondary | ICD-10-CM

## 2022-05-08 MED ORDER — PHENTERMINE HCL 37.5 MG PO CAPS: 37.5000 mg | ORAL_CAPSULE | ORAL | 2 refills | Status: DC

## 2022-05-08 MED ORDER — METFORMIN HCL 500 MG PO TABS: 500.0000 mg | ORAL_TABLET | Freq: Two times a day (BID) | ORAL | 1 refills | Status: DC

## 2022-05-08 NOTE — Patient Instructions (Signed)
Please start taking Phentermine 37.5 mg once daily. ? ?Please start taking Metformin 500 mg twice daily. ? ?Please follow low carb diet and perform moderate exercise/walking at least 150 mins/week. ?

## 2022-05-08 NOTE — Progress Notes (Signed)
?  ? ?Virtual Visit via Telephone Note  ? ?This visit type was conducted due to national recommendations for restrictions regarding the COVID-19 Pandemic (e.g. social distancing) in an effort to limit this patient's exposure and mitigate transmission in our community.  Due to her co-morbid illnesses, this patient is at least at moderate risk for complications without adequate follow up.  This format is felt to be most appropriate for this patient at this time.  The patient did not have access to video technology/had technical difficulties with video requiring transitioning to audio format only (telephone).  All issues noted in this document were discussed and addressed.  No physical exam could be performed with this format. ? ?Evaluation Performed:  Follow-up visit ? ?Date:  05/08/2022  ? ?ID:  [name] [name], DOB 1961/06/07, MRN [id number] ? ?Patient Location: Home ?Provider Location: Office/Clinic ? ?Participants: Patient ?Location of Patient: Home ?Location of Provider: Telehealth ?Consent was obtain for visit to be over via telehealth. ?I verified that I am speaking with the correct person using two identifiers. ? ?PCP:  Lindell Spar, MD  ? ?Chief Complaint: Obesity and prediabetes follow-up ? ?History of Present Illness:   ? ?[name] [name] is a 61 y.o. female who has a televisit for follow-up follow-up of obesity and prediabetes.  She had started taking phentermine 15 mg QD, but did not have enough response with it.  She has been trying to follow low-carb diet.  She has history of prediabetes as well.  She denies any polyuria or polydipsia currently.  She is willing to take metformin for prediabetes and insulin resistance. ? ?The patient does not have symptoms concerning for COVID-19 infection (fever, chills, cough, or new shortness of breath).  ? ?Past Medical, Surgical, Social History, Allergies, and Medications have been Reviewed. ? ?Past Medical History:  ?Diagnosis Date  ? Abnormal Pap smear   ? Adult  ADHD   ? Anxiety   ? Arthritis   ? "knees" (02/10/2018)  ? Bipolar disorder ([place])   ? Cholelithiases   ? 04/2015  ? CKD (chronic kidney disease) 08/15/2020  ? Saw Dr Theador Hawthorne 08/14/20  ? Depression 04/15/2013  ? Ear infection 03/19/2016  ? Essential hypertension, benign   ? ETOH abuse   ? drinks wine 5 day/week  ? Family history of brain cancer   ? Family history of BRCA1 gene positive   ? Family history of breast cancer   ? Family history of ovarian cancer   ? Family history of pancreatic cancer   ? Family history of stomach cancer   ? Family history of thyroid cancer   ? GAD (generalized anxiety disorder)   ? History of blood transfusion 1980s  ? "related to miscarriage"  ? Incompetent cervix   ? Obesity   ? OSA on CPAP   ? Pneumonia   ? "used to have it alot; nothing in years" (02/10/2018)  ? PTSD (post-traumatic stress disorder)   ? Thickened endometrium 05/02/2014  ? Vaginal Pap smear, abnormal   ? Vertigo 03/19/2016  ? ?Past Surgical History:  ?Procedure Laterality Date  ? CATARACT EXTRACTION    ? CERVICAL CERCLAGE    ? CHOLECYSTECTOMY N/A 05/24/2015  ? Procedure: LAPAROSCOPIC CHOLECYSTECTOMY WITH INTRAOPERATIVE CHOLANGIOGRAM;  Surgeon: Aviva Signs Md, MD;  Location: [facility];  Service: General;  Laterality: N/A;  ? DILATION AND CURETTAGE OF UTERUS    ? JOINT REPLACEMENT    ? KNEE ARTHROSCOPY Right 2015  ? TONSILLECTOMY    ? TOTAL  KNEE ARTHROPLASTY Right 02/10/2018  ? TOTAL KNEE ARTHROPLASTY Right 02/10/2018  ? Procedure: TOTAL KNEE ARTHROPLASTY;  Surgeon: Melrose Nakayama, MD;  Location: [facility];  Service: Orthopedics;  Laterality: Right;  ?  ? ?Current Meds  ?Medication Sig  ? ALPRAZolam (XANAX) 0.25 MG tablet Take 0.25 mg by mouth at bedtime as needed for sleep.  ? benzonatate (TESSALON) 100 MG capsule Take 1 capsule (100 mg total) by mouth 2 (two) times daily as needed for cough.  ? escitalopram (LEXAPRO) 20 MG tablet TAKE 1/2 TABLET BY MOUTH DAILY  ? hydrochlorothiazide (HYDRODIURIL) 25 MG tablet TAKE 1 TABLET(25 MG) BY  MOUTH DAILY  ? lamoTRIgine (LAMICTAL) 200 MG tablet Take 100-200 mg by mouth See admin instructions. Take 100 mg in the morning and 200 mg in the evening  ? lisinopril (ZESTRIL) 10 MG tablet TAKE 1 TABLET BY MOUTH DAILY  ? metFORMIN (GLUCOPHAGE) 500 MG tablet Take 1 tablet (500 mg total) by mouth 2 (two) times daily with a meal.  ? phentermine 37.5 MG capsule Take 1 capsule (37.5 mg total) by mouth every morning.  ?  ? ?Allergies:   Patient has no known allergies.  ? ?ROS:   ?Please see the history of present illness.    ? ?All other systems reviewed and are negative. ? ? ?Labs/Other Tests and Data Reviewed:   ? ?Recent Labs: ?No results found for requested labs within last 8760 hours.  ? ?Recent Lipid Panel ?Lab Results  ?Component Value Date/Time  ? CHOL 263 (H) 05/16/2020 10:06 AM  ? TRIG 185 (H) 05/16/2020 10:06 AM  ? HDL 51 05/16/2020 10:06 AM  ? CHOLHDL 5.2 (H) 05/16/2020 10:06 AM  ? CHOLHDL 4.9 08/31/2014 10:20 AM  ? LDLCALC 178 (H) 05/16/2020 10:06 AM  ? ? ?Wt Readings from Last 3 Encounters:  ?05/08/22 248 lb (112.5 kg)  ?01/17/22 243 lb (110.2 kg)  ?12/11/21 243 lb 0.6 oz (110.2 kg)  ?  ? ?ASSESSMENT & PLAN:   ? ?Morbid obesity ([place]) ?BMI Readings from Last 2 Encounters:  ?05/08/22 40.03 kg/m?  ?01/17/22 39.22 kg/m?  ? ?Diet modification and moderate exercise advised ?Unable to lose weight with lifestyle modification alone ?Had started Phentermine 15 mg QD, increased dose to 37.5 mg QD ?Unable to start GLP-1 agonist therapy as she has family history of thyroid cancer ? ?Prediabetes ?Lab Results  ?Component Value Date  ? HGBA1C 5.7 (H) 05/16/2020  ? ?Advised to follow low carb diet ?Started Metformin ? ? ?Time:   ?Today, I have spent 12 minutes reviewing the chart, including problem list, medications, and with the patient with telehealth technology discussing the above problems. ? ? ?Medication Adjustments/Labs and Tests Ordered: ?Current medicines are reviewed at length with the patient today.  Concerns  regarding medicines are outlined above.  ? ?Tests Ordered: ?No orders of the defined types were placed in this encounter. ? ? ?Medication Changes: ?Meds ordered this encounter  ?Medications  ? phentermine 37.5 MG capsule  ?  Sig: Take 1 capsule (37.5 mg total) by mouth every morning.  ?  Dispense:  30 capsule  ?  Refill:  2  ? metFORMIN (GLUCOPHAGE) 500 MG tablet  ?  Sig: Take 1 tablet (500 mg total) by mouth 2 (two) times daily with a meal.  ?  Dispense:  180 tablet  ?  Refill:  1  ? ? ? ?Note: This dictation was prepared with Dragon dictation along with smaller phrase technology. Similar sounding words can be transcribed inadequately or  may not be corrected upon review. Any transcriptional errors that result from this process are unintentional.  ?  ? ? ?Disposition:  Follow up  ?Signed, ?Lindell Spar, MD  ?05/08/2022 4:57 PM    ? ?[facility] ?[facility] ?

## 2022-05-08 NOTE — Assessment & Plan Note (Signed)
Lab Results  ?Component Value Date  ? HGBA1C 5.7 (H) 05/16/2020  ? ?Advised to follow low carb diet ?Started Metformin ?

## 2022-05-08 NOTE — Assessment & Plan Note (Addendum)
BMI Readings from Last 2 Encounters:  ?05/08/22 40.03 kg/m?  ?01/17/22 39.22 kg/m?  ? ?Diet modification and moderate exercise advised ?Unable to lose weight with lifestyle modification alone ?Had started Phentermine 15 mg QD, increased dose to 37.5 mg QD ?Unable to start GLP-1 agonist therapy as she has family history of thyroid cancer ?

## 2022-05-12 ENCOUNTER — Encounter: Payer: Self-pay | Admitting: Internal Medicine

## 2022-05-22 ENCOUNTER — Other Ambulatory Visit: Payer: Self-pay | Admitting: Nephrology

## 2022-05-22 ENCOUNTER — Other Ambulatory Visit (HOSPITAL_COMMUNITY): Payer: Self-pay | Admitting: Nephrology

## 2022-05-22 DIAGNOSIS — N170 Acute kidney failure with tubular necrosis: Secondary | ICD-10-CM

## 2022-05-22 DIAGNOSIS — R7303 Prediabetes: Secondary | ICD-10-CM

## 2022-05-22 DIAGNOSIS — N1831 Chronic kidney disease, stage 3a: Secondary | ICD-10-CM

## 2022-05-22 DIAGNOSIS — I129 Hypertensive chronic kidney disease with stage 1 through stage 4 chronic kidney disease, or unspecified chronic kidney disease: Secondary | ICD-10-CM

## 2022-05-22 DIAGNOSIS — E875 Hyperkalemia: Secondary | ICD-10-CM

## 2022-05-31 ENCOUNTER — Ambulatory Visit (HOSPITAL_COMMUNITY)
Admission: RE | Admit: 2022-05-31 | Discharge: 2022-05-31 | Disposition: A | Discharge status: Home / Self Care | Payer: Medicare HMO | Source: Ambulatory Visit | Attending: Nephrology | Admitting: Nephrology

## 2022-05-31 DIAGNOSIS — N1831 Chronic kidney disease, stage 3a: Secondary | ICD-10-CM | POA: Insufficient documentation

## 2022-05-31 DIAGNOSIS — N170 Acute kidney failure with tubular necrosis: Secondary | ICD-10-CM | POA: Insufficient documentation

## 2022-05-31 DIAGNOSIS — I129 Hypertensive chronic kidney disease with stage 1 through stage 4 chronic kidney disease, or unspecified chronic kidney disease: Secondary | ICD-10-CM | POA: Insufficient documentation

## 2022-05-31 DIAGNOSIS — E875 Hyperkalemia: Secondary | ICD-10-CM | POA: Diagnosis present

## 2022-05-31 DIAGNOSIS — R7303 Prediabetes: Secondary | ICD-10-CM | POA: Insufficient documentation

## 2022-06-12 ENCOUNTER — Ambulatory Visit: Payer: Medicare HMO | Admitting: Internal Medicine

## 2022-06-19 ENCOUNTER — Ambulatory Visit: Payer: Medicare HMO | Admitting: Internal Medicine

## 2022-06-21 IMAGING — US US RENAL
1 series · 14 of 25 positions shown · non-contrast
Comparison: None Available.

CLINICAL DATA: Stage 3a chronic kidney disease (CKD) (HCC)

EXAM:
RENAL / URINARY TRACT ULTRASOUND COMPLETE

[Series 1: us renal · 14 of 81 slices shown]
[im 1/81]
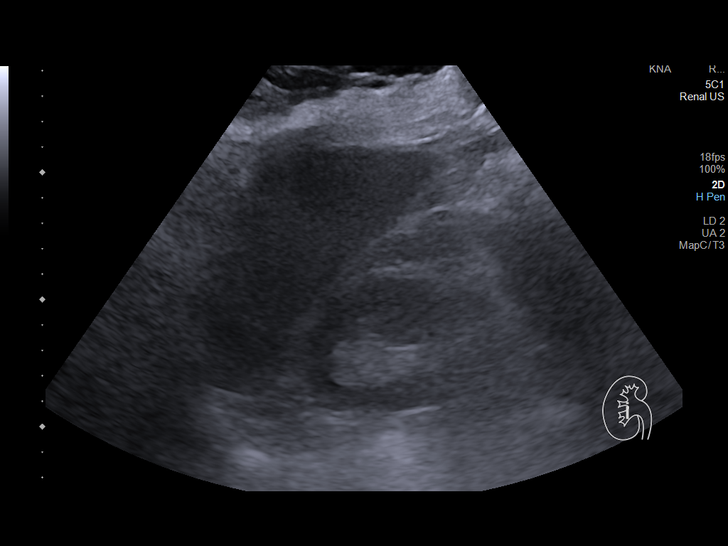
[im 7/81]
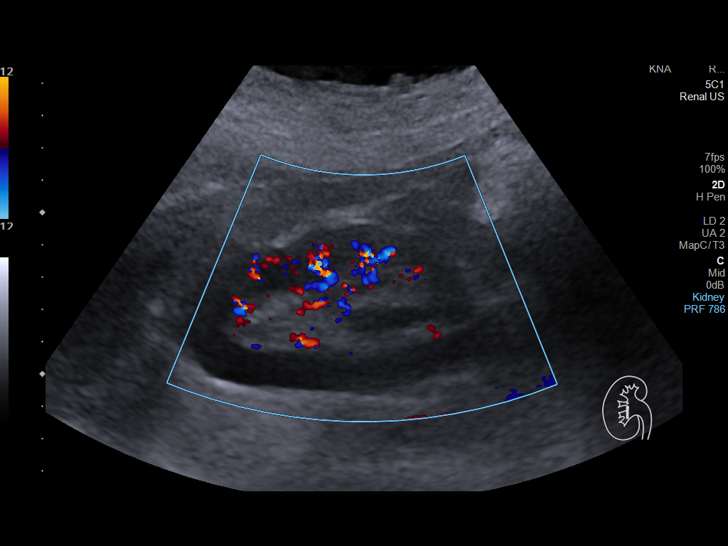
[im 14/81]
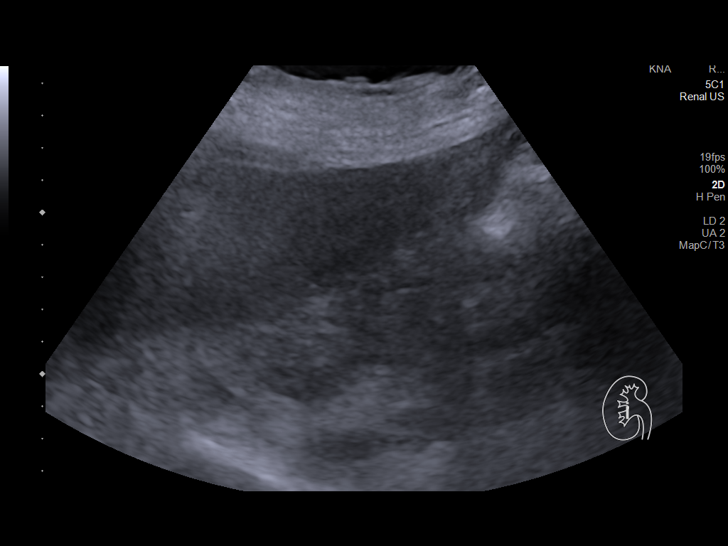
[im 21/81]
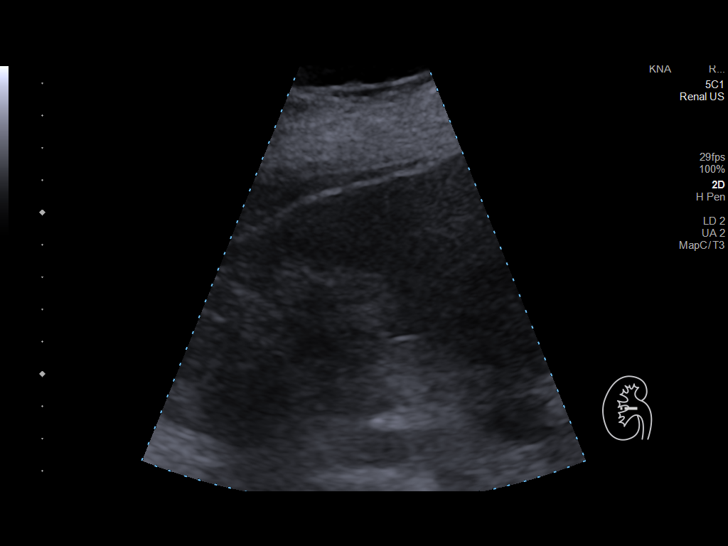
[im 27/81]
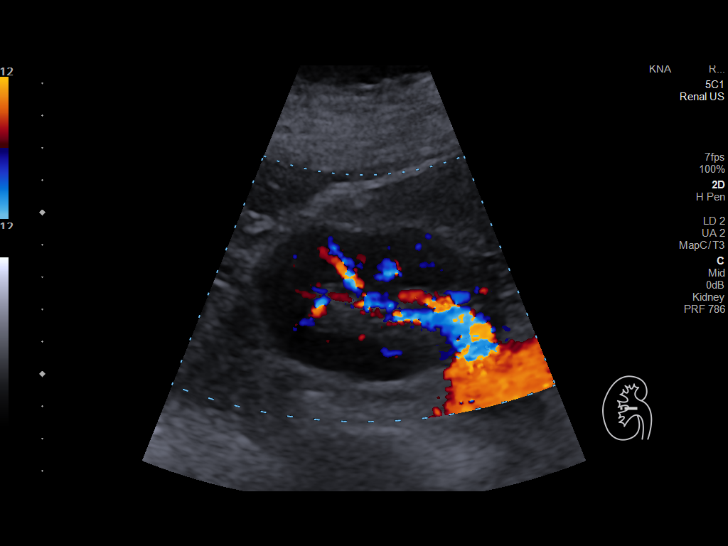
[im 31/81]
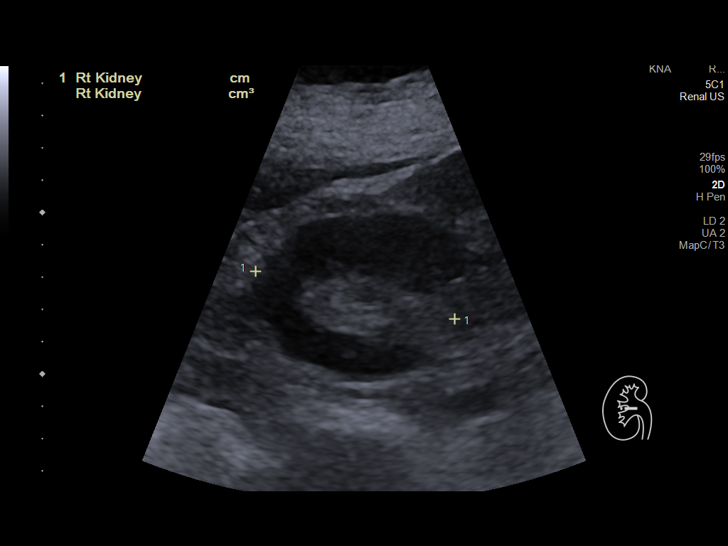
[im 37/81]
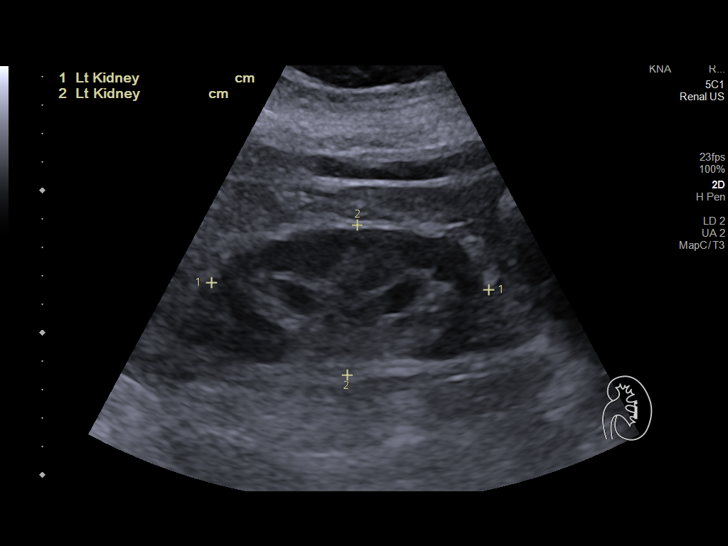
[im 44/81]
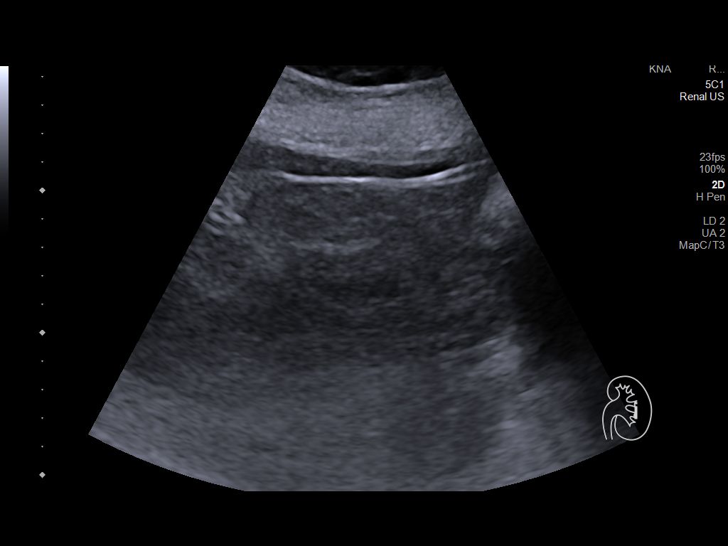
[im 51/81]
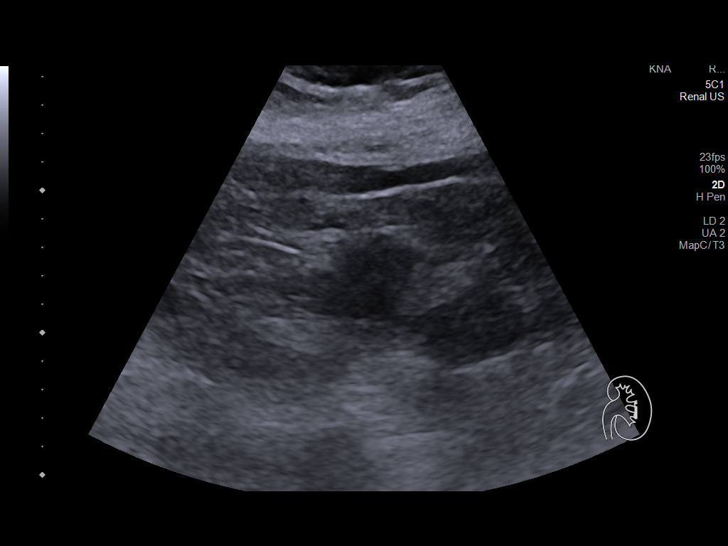
[im 54/81]
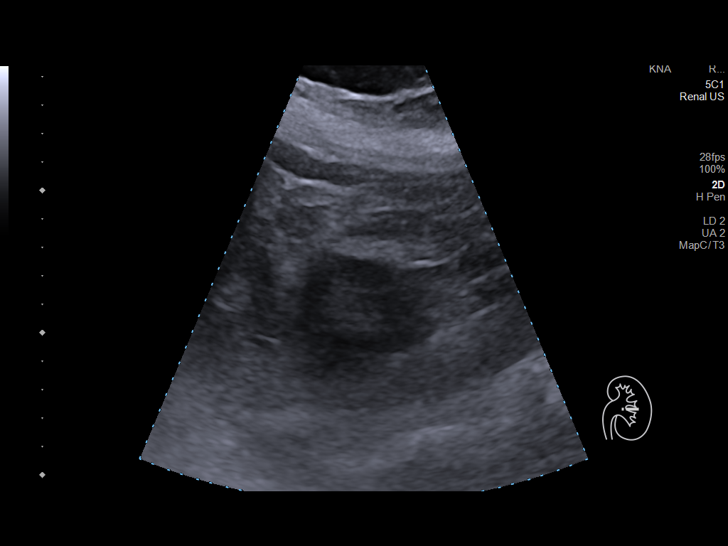
[im 61/81]
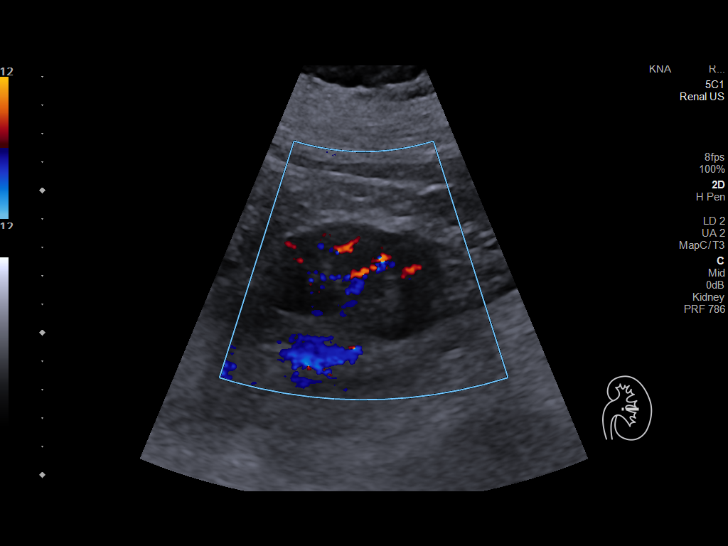
[im 67/81]
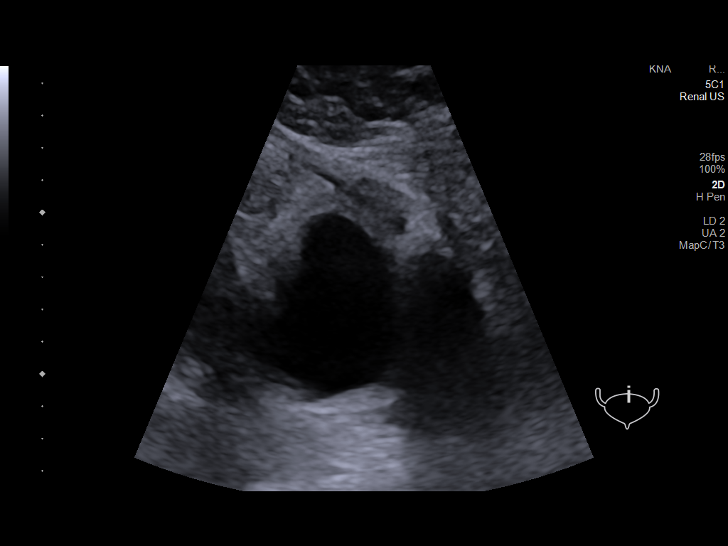
[im 74/81]
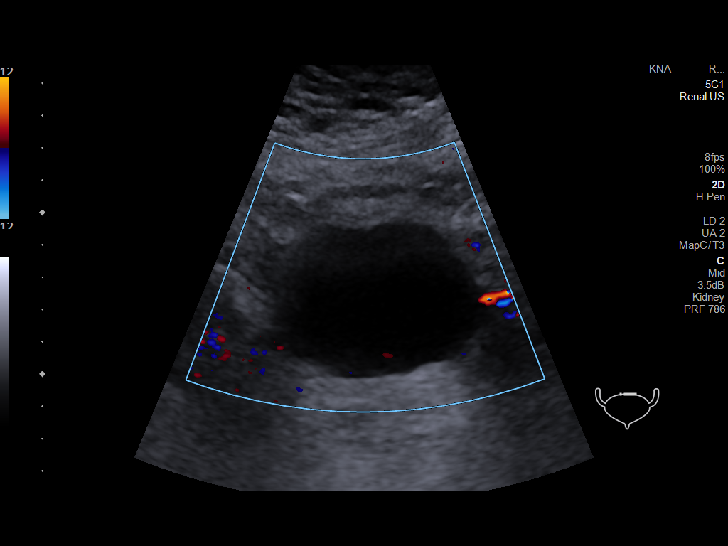
[im 81/81]
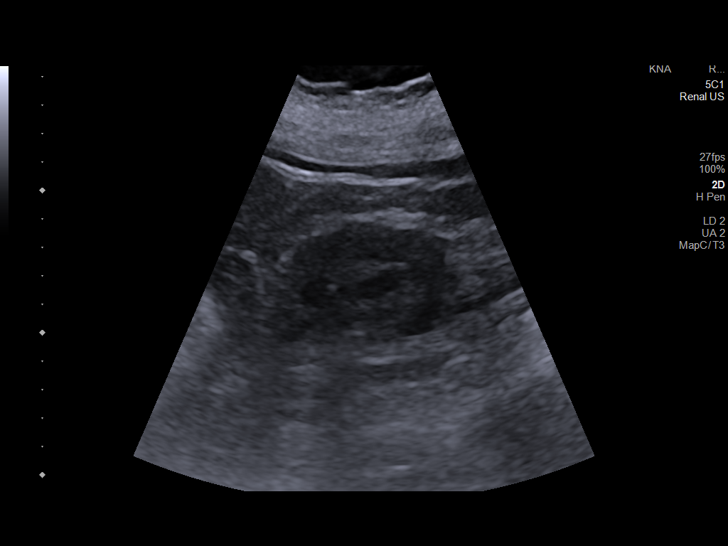

[14 of 25 positions shown; findings below may reference images not displayed]

FINDINGS: Right Kidney:

Renal measurements: 9.8 x 5.4 x 6.3 cm = volume: 174 mL.
Echogenicity within normal limits. No mass visualized. Mild
pelviectasis which resolves postvoid.

Left Kidney:

Renal measurements: 9.8 x 5.3 x 5.0 cm = volume: 135 mL. Cortical
thinning is again noted. Echogenicity within normal limits. No mass
visualized. Similar pelviectasis in comparison to prior. This mildly
decreases postvoid.

Bladder:

Appears normal for degree of bladder distention.

Other:

None.
IMPRESSION: Mild bilateral pelviectasis without frank hydronephrosis which
decreases postvoid. This morphologically appears similar in
comparison to prior.

## 2022-07-09 ENCOUNTER — Other Ambulatory Visit (HOSPITAL_COMMUNITY): Payer: Self-pay | Admitting: Nephrology

## 2022-07-09 DIAGNOSIS — N170 Acute kidney failure with tubular necrosis: Secondary | ICD-10-CM

## 2022-07-09 DIAGNOSIS — N1831 Chronic kidney disease, stage 3a: Secondary | ICD-10-CM

## 2022-07-15 ENCOUNTER — Encounter (HOSPITAL_COMMUNITY)
Admission: RE | Admit: 2022-07-15 | Discharge: 2022-07-15 | Disposition: A | Discharge status: Home / Self Care | Payer: Medicare HMO | Source: Ambulatory Visit | Attending: Nephrology | Admitting: Nephrology

## 2022-07-15 DIAGNOSIS — N1831 Chronic kidney disease, stage 3a: Secondary | ICD-10-CM | POA: Insufficient documentation

## 2022-07-15 DIAGNOSIS — N170 Acute kidney failure with tubular necrosis: Secondary | ICD-10-CM | POA: Diagnosis not present

## 2022-07-15 MED ORDER — FUROSEMIDE 10 MG/ML IJ SOLN
INTRAMUSCULAR | Status: AC
  Filled 2022-07-15: qty 6

## 2022-07-15 MED ORDER — TECHNETIUM TC 99M MERTIATIDE
5.0000 | Freq: Once | INTRAVENOUS | Status: AC | PRN
  Administered 2022-07-15: 5.2 via INTRAVENOUS

## 2022-07-15 MED ORDER — FUROSEMIDE 10 MG/ML IJ SOLN
60.0000 mg | Freq: Once | INTRAMUSCULAR | Status: AC
  Administered 2022-07-15: 60 mg via INTRAVENOUS

## 2022-07-18 ENCOUNTER — Encounter: Payer: Self-pay | Admitting: Internal Medicine

## 2022-07-18 ENCOUNTER — Ambulatory Visit (INDEPENDENT_AMBULATORY_CARE_PROVIDER_SITE_OTHER): Payer: Medicare HMO | Admitting: Internal Medicine

## 2022-07-18 VITALS — BP 133/59 | HR 91 | Ht 66.0 in | Wt 236.4 lb

## 2022-07-18 DIAGNOSIS — E782 Mixed hyperlipidemia: Secondary | ICD-10-CM

## 2022-07-18 DIAGNOSIS — I10 Essential (primary) hypertension: Secondary | ICD-10-CM

## 2022-07-18 DIAGNOSIS — Z0001 Encounter for general adult medical examination with abnormal findings: Secondary | ICD-10-CM

## 2022-07-18 DIAGNOSIS — R7303 Prediabetes: Secondary | ICD-10-CM

## 2022-07-18 DIAGNOSIS — N1831 Chronic kidney disease, stage 3a: Secondary | ICD-10-CM

## 2022-07-18 NOTE — Patient Instructions (Signed)
Please continue taking medications as prescribed.  Please continue to follow low carb diet and perform moderate exercise/walking at least 150 mins/week.  Please consider getting Shingrix and Tdap vaccine at local pharmacy. 

## 2022-07-18 NOTE — Assessment & Plan Note (Signed)
BMI Readings from Last 2 Encounters:  07/18/22 38.16 kg/m  05/08/22 40.03 kg/m   Diet modification and moderate exercise advised Unable to lose weight with lifestyle modification alone Had started Phentermine 37.5 mg QD Unable to start GLP-1 agonist therapy as she has family history of thyroid cancer

## 2022-07-18 NOTE — Assessment & Plan Note (Signed)
Physical exam as documented. Recent blood tests reviewed from care everywhere. Lipid profile ordered.

## 2022-07-18 NOTE — Assessment & Plan Note (Signed)
Last BMP showed GFR of 59, which has improved from 38 in 07/2020 On HCTZ Followed by Nephrology - Dr Wolfgang Phoenix Avoid nephrotoxic agents

## 2022-07-18 NOTE — Assessment & Plan Note (Signed)
Last lipid profile in 2021 Check lipid profile

## 2022-07-18 NOTE — Progress Notes (Signed)
Established Patient Office Visit  Subjective:  Patient ID: [name] [name], female    DOB: 1961/02/18  Age: 61 y.o. MRN: [id number]  CC:  Chief Complaint  Patient presents with   Annual Exam    Pt is here for CPE no health concerns. Pt states she has discontinued bp medication per her kidney specialist.     HPI [name] [name] is a 61 y.o. female with past medical history of HTN, OSA, OA of right knee, CKD stage IIIa, depression with anxiety and morbid obesity who presents for annual physical.  HTN: BP is well-controlled. Takes medications regularly. Lisinopril was discontinued by Dr. Theador Hawthorne due to CKD. Patient denies headache, dizziness, chest pain, dyspnea or palpitations.  CKD: She had NM renal study recently. Her GFR was 59 in 06/23. She denies any dysuria, hematuria or urinary hesitancy or resistance.  OSA: Uses CPAP regularly.  Followed by Dr. Elsworth Soho.  Obesity: Tolerates Phentermine well. She has lost only 7 lbs since 12/22.  Depression with anxiety: She follows up with her psychiatry [place] in [place].  She takes Lexapro, Lamictal and as needed Xanax.  She rarely needs Xanax for anxiety/panic episode.  She denies any anhedonia, SI or HI currently.      Past Medical History:  Diagnosis Date   Abnormal Pap smear    Adult ADHD    Anxiety    Arthritis    "knees" (02/10/2018)   Bipolar disorder ([place])    Cholelithiases    04/2015   CKD (chronic kidney disease) 08/15/2020   Saw Dr Theador Hawthorne 08/14/20   Depression 04/15/2013   Ear infection 03/19/2016   Essential hypertension, benign    ETOH abuse    drinks wine 5 day/week   Family history of brain cancer    Family history of BRCA1 gene positive    Family history of breast cancer    Family history of ovarian cancer    Family history of pancreatic cancer    Family history of stomach cancer    Family history of thyroid cancer    GAD (generalized anxiety disorder)    History of blood transfusion 1980s   "related to miscarriage"    Incompetent cervix    Obesity    OSA on CPAP    Pneumonia    "used to have it alot; nothing in years" (02/10/2018)   PTSD (post-traumatic stress disorder)    Thickened endometrium 05/02/2014   Vaginal Pap smear, abnormal    Vertigo 03/19/2016    Past Surgical History:  Procedure Laterality Date   CATARACT EXTRACTION     CERVICAL CERCLAGE     CHOLECYSTECTOMY N/A 05/24/2015   Procedure: LAPAROSCOPIC CHOLECYSTECTOMY WITH INTRAOPERATIVE CHOLANGIOGRAM;  Surgeon: Aviva Signs Md, MD;  Location: [facility];  Service: General;  Laterality: N/A;   DILATION AND CURETTAGE OF UTERUS     JOINT REPLACEMENT     KNEE ARTHROSCOPY Right 2015   TONSILLECTOMY     TOTAL KNEE ARTHROPLASTY Right 02/10/2018   TOTAL KNEE ARTHROPLASTY Right 02/10/2018   Procedure: TOTAL KNEE ARTHROPLASTY;  Surgeon: Melrose Nakayama, MD;  Location: [facility];  Service: Orthopedics;  Laterality: Right;    Family History  Problem Relation Age of Onset   Hypertension Father    Cancer Father 42       sarcoma on face   Hypertension Mother    Pancreatic cancer Mother 32   Asthma Mother    Sleep apnea Mother    Coronary artery disease Maternal Grandfather  Sleep apnea Brother    Stomach cancer Paternal Aunt        dx in her late 78s   Stomach cancer Paternal Uncle 40   Brain cancer Paternal Uncle 71   Cirrhosis Paternal Grandmother        never drank   Stroke Maternal Aunt    Lung cancer Maternal Aunt 65       lung   Heart attack Maternal Aunt    Diabetes Other    Ovarian cancer Maternal Aunt 68       ovarian cancer   Dementia Maternal Aunt    Breast cancer Cousin 12       maternal cousin   BRCA 1/2 Cousin        BRCA1 positive ([id number])   Thyroid cancer Paternal Aunt        dx in her 50s/60s   Cancer Paternal Uncle        unknown type   Pancreatic cancer Cousin        dx in his 36s, paternal cousin   Colon cancer Neg Hx    Esophageal cancer Neg Hx    Rectal cancer Neg Hx     Social History    Socioeconomic History   Marital status: Married    Spouse name: [name]   Number of children: 2   Years of education: Not on file   Highest education level: Not on file  Occupational History   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    Employer: [employer]    Comment: retired   Tobacco Use   Smoking status: Former    Packs/day: 1.00    Years: 25.00    Total pack years: 25.00    Types: Cigarettes    Quit date: 12/30/2005    Years since quitting: 16.5   Smokeless tobacco: Never  Vaping Use   Vaping Use: Never used  Substance and Sexual Activity   Alcohol use: Yes    Alcohol/week: 7.0 standard drinks of alcohol    Types: 5 Glasses of wine, 2 Standard drinks or equivalent per week    Comment: occ   Drug use: No   Sexual activity: Yes    Birth control/protection: Post-menopausal  Other Topics Concern   Not on file  Social History Narrative   Not on file   Social Determinants of Health   Financial Resource Strain: Low Risk  (03/05/2022)   Overall Financial Resource Strain (CARDIA)    Difficulty of Paying Living Expenses: Not very hard  Food Insecurity: No Food Insecurity (07/12/2021)   Hunger Vital Sign    Worried About Running Out of Food in the Last Year: Never true    Ran Out of Food in the Last Year: Never true  Transportation Needs: No Transportation Needs (03/05/2022)   PRAPARE - Hydrologist (Medical): No    Lack of Transportation (Non-Medical): No  Physical Activity: Insufficiently Active (03/05/2022)   Exercise Vital Sign    Days of Exercise per Week: 2 days    Minutes of Exercise per Session: 20 min  Stress: No Stress Concern Present (03/05/2022)   DISH    Feeling of Stress : Only a little  Social Connections: Moderately Isolated (03/05/2022)   Social Connection and Isolation Panel [NHANES]    Frequency of Communication with Friends and  Family: More than three times a week    Frequency of Social Gatherings with Friends  and Family: More than three times a week    Attends Religious Services: Never    Active Member of Clubs or Organizations: No    Attends Archivist Meetings: Never    Marital Status: Married  Human resources officer Violence: Not At Risk (03/05/2022)   Humiliation, Afraid, Rape, and Kick questionnaire    Fear of Current or Ex-Partner: No    Emotionally Abused: No    Physically Abused: No    Sexually Abused: No    Outpatient Medications Prior to Visit  Medication Sig Dispense Refill   ALPRAZolam (XANAX) 0.25 MG tablet Take 0.25 mg by mouth at bedtime as needed for sleep.     benzonatate (TESSALON) 100 MG capsule Take 1 capsule (100 mg total) by mouth 2 (two) times daily as needed for cough. 20 capsule 0   escitalopram (LEXAPRO) 20 MG tablet TAKE 1/2 TABLET BY MOUTH DAILY 90 tablet 3   hydrochlorothiazide (HYDRODIURIL) 25 MG tablet TAKE 1 TABLET(25 MG) BY MOUTH DAILY 90 tablet 3   lamoTRIgine (LAMICTAL) 200 MG tablet Take 100-200 mg by mouth See admin instructions. Take 100 mg in the morning and 200 mg in the evening  5   metFORMIN (GLUCOPHAGE) 500 MG tablet Take 1 tablet (500 mg total) by mouth 2 (two) times daily with a meal. 180 tablet 1   phentermine 37.5 MG capsule Take 1 capsule (37.5 mg total) by mouth every morning. 30 capsule 2   lisinopril (ZESTRIL) 10 MG tablet TAKE 1 TABLET BY MOUTH DAILY (Patient not taking: Reported on 07/18/2022) 90 tablet 3   No facility-administered medications prior to visit.    No Known Allergies  ROS Review of Systems  Constitutional:  Negative for chills and fever.  HENT:  Negative for congestion, sinus pressure, sinus pain and sore throat.   Eyes:  Negative for pain and discharge.  Respiratory:  Negative for cough and shortness of breath.   Cardiovascular:  Negative for chest pain and palpitations.  Gastrointestinal:  Negative for abdominal pain, diarrhea,  nausea and vomiting.  Endocrine: Negative for polydipsia and polyuria.  Genitourinary:  Negative for dysuria and hematuria.  Musculoskeletal:  Positive for arthralgias. Negative for neck pain and neck stiffness.  Skin:  Negative for rash.  Neurological:  Negative for dizziness and weakness.  Psychiatric/Behavioral:  Negative for agitation and behavioral problems. The patient is nervous/anxious.       Objective:    Physical Exam Vitals reviewed.  Constitutional:      General: She is not in acute distress.    Appearance: She is obese. She is not diaphoretic.  HENT:     Head: Normocephalic and atraumatic.     Nose: Nose normal.     Mouth/Throat:     Mouth: Mucous membranes are moist.  Eyes:     General: No scleral icterus.    Extraocular Movements: Extraocular movements intact.  Cardiovascular:     Rate and Rhythm: Normal rate and regular rhythm.     Heart sounds: Normal heart sounds. No murmur heard. Pulmonary:     Breath sounds: Normal breath sounds. No wheezing or rales.  Abdominal:     Palpations: Abdomen is soft.     Tenderness: There is no abdominal tenderness.  Musculoskeletal:     Cervical back: Neck supple. No tenderness.     Right lower leg: No edema.     Left lower leg: No edema.  Skin:    General: Skin is warm.     Findings: No rash.  Neurological:     General: No focal deficit present.     Mental Status: She is alert and oriented to person, place, and time.     Cranial Nerves: No cranial nerve deficit.     Sensory: No sensory deficit.     Motor: No weakness.  Psychiatric:        Mood and Affect: Mood normal.        Behavior: Behavior normal.     BP (!) 133/59   Pulse 91   Ht 5' 6" (1.676 m)   Wt 236 lb 6.4 oz (107.2 kg)   SpO2 95%   BMI 38.16 kg/m  Wt Readings from Last 3 Encounters:  07/18/22 236 lb 6.4 oz (107.2 kg)  05/08/22 248 lb (112.5 kg)  01/17/22 243 lb (110.2 kg)    Lab Results  Component Value Date   TSH 4.240 06/09/2020   Lab  Results  Component Value Date   WBC 6.5 05/16/2020   HGB 13.7 05/16/2020   HCT 40.7 05/16/2020   MCV 94 05/16/2020   PLT 352 05/16/2020   Lab Results  Component Value Date   NA 140 07/13/2020   K 4.6 07/13/2020   CO2 23 07/13/2020   GLUCOSE 99 07/13/2020   BUN 21 07/13/2020   CREATININE 1.41 (H) 07/13/2020   BILITOT <0.2 07/13/2020   ALKPHOS 125 (H) 07/13/2020   AST 23 07/13/2020   ALT 19 07/13/2020   PROT 7.3 07/13/2020   ALBUMIN 4.9 07/13/2020   CALCIUM 10.0 07/13/2020   ANIONGAP 14 02/03/2018   Lab Results  Component Value Date   CHOL 263 (H) 05/16/2020   Lab Results  Component Value Date   HDL 51 05/16/2020   Lab Results  Component Value Date   LDLCALC 178 (H) 05/16/2020   Lab Results  Component Value Date   TRIG 185 (H) 05/16/2020   Lab Results  Component Value Date   CHOLHDL 5.2 (H) 05/16/2020   Lab Results  Component Value Date   HGBA1C 5.7 (H) 05/16/2020      Assessment & Plan:   Problem List Items Addressed This Visit       Cardiovascular and Mediastinum   Essential hypertension, benign    BP Readings from Last 1 Encounters:  07/18/22 (!) 133/59  Well-controlled with HCTZ Counseled for compliance with the medications Advised DASH diet and moderate exercise/walking, at least 150 mins/week        Genitourinary   Chronic kidney disease, stage 3a ([place])    Last BMP showed GFR of 59, which has improved from 38 in 07/2020 On HCTZ Followed by Nephrology - Dr Theador Hawthorne Avoid nephrotoxic agents        Other   Morbid obesity ([place])    BMI Readings from Last 2 Encounters:  07/18/22 38.16 kg/m  05/08/22 40.03 kg/m  Diet modification and moderate exercise advised Unable to lose weight with lifestyle modification alone Had started Phentermine 37.5 mg QD Unable to start GLP-1 agonist therapy as she has family history of thyroid cancer      Prediabetes    Lab Results  Component Value Date   HGBA1C 5.7 (H) 05/16/2020  Advised to follow  low carb diet On Metformin      Mixed hyperlipidemia    Last lipid profile in 2021 Check lipid profile      Relevant Orders   Lipid Profile   Encounter for general adult medical examination with abnormal findings - Primary    Physical exam as documented.  Recent blood tests reviewed from care everywhere. Lipid profile ordered.       No orders of the defined types were placed in this encounter.   Follow-up: Return in about 6 months (around 01/18/2023) for HLD and prediabetes.    Lindell Spar, MD

## 2022-07-18 NOTE — Assessment & Plan Note (Signed)
BP Readings from Last 1 Encounters:  07/18/22 (!) 133/59   Well-controlled with HCTZ Counseled for compliance with the medications Advised DASH diet and moderate exercise/walking, at least 150 mins/week

## 2022-07-18 NOTE — Assessment & Plan Note (Signed)
Lab Results  Component Value Date   HGBA1C 5.7 (H) 05/16/2020   Advised to follow low carb diet On Metformin

## 2022-07-19 ENCOUNTER — Telehealth: Payer: Self-pay

## 2022-07-19 LAB — LIPID PANEL
Chol/HDL Ratio: 4.8 ratio — ABNORMAL HIGH (ref 0.0–4.4)
Cholesterol, Total: 234 mg/dL — ABNORMAL HIGH (ref 100–199)
HDL: 49 mg/dL (ref >39)
LDL Chol Calc (NIH): 151 mg/dL — ABNORMAL HIGH (ref 0–99)
Triglycerides: 187 mg/dL — ABNORMAL HIGH (ref 0–149)
VLDL Cholesterol Cal: 34 mg/dL (ref 5–40)

## 2022-07-19 NOTE — Telephone Encounter (Signed)
Pt informed of labs

## 2022-07-19 NOTE — Telephone Encounter (Signed)
Patient returning lab results call 

## 2022-08-17 ENCOUNTER — Other Ambulatory Visit: Payer: Self-pay | Admitting: Internal Medicine

## 2022-08-21 ENCOUNTER — Other Ambulatory Visit (HOSPITAL_COMMUNITY): Payer: Self-pay | Admitting: Nephrology

## 2022-08-21 ENCOUNTER — Ambulatory Visit (HOSPITAL_COMMUNITY)
Admission: RE | Admit: 2022-08-21 | Discharge: 2022-08-21 | Disposition: A | Discharge status: Home / Self Care | Payer: Medicare HMO | Source: Ambulatory Visit | Attending: Vascular Surgery | Admitting: Vascular Surgery

## 2022-08-21 DIAGNOSIS — I129 Hypertensive chronic kidney disease with stage 1 through stage 4 chronic kidney disease, or unspecified chronic kidney disease: Secondary | ICD-10-CM | POA: Insufficient documentation

## 2022-09-26 ENCOUNTER — Other Ambulatory Visit: Payer: Self-pay | Admitting: Adult Health

## 2022-10-09 ENCOUNTER — Other Ambulatory Visit (HOSPITAL_COMMUNITY): Payer: Self-pay | Admitting: Internal Medicine

## 2022-10-09 DIAGNOSIS — Z1231 Encounter for screening mammogram for malignant neoplasm of breast: Secondary | ICD-10-CM

## 2022-10-24 ENCOUNTER — Ambulatory Visit (HOSPITAL_COMMUNITY)
Admission: RE | Admit: 2022-10-24 | Discharge: 2022-10-24 | Disposition: A | Discharge status: Home / Self Care | Payer: Medicare HMO | Source: Ambulatory Visit | Attending: Internal Medicine | Admitting: Internal Medicine

## 2022-10-24 DIAGNOSIS — Z1231 Encounter for screening mammogram for malignant neoplasm of breast: Secondary | ICD-10-CM | POA: Insufficient documentation

## 2022-11-08 LAB — LIPID PANEL
Chol/HDL Ratio: 3.9 ratio (ref 0.0–4.4)
Cholesterol, Total: 225 mg/dL — ABNORMAL HIGH (ref 100–199)
HDL: 57 mg/dL (ref >39)
LDL Chol Calc (NIH): 149 mg/dL — ABNORMAL HIGH (ref 0–99)
Triglycerides: 107 mg/dL (ref 0–149)
VLDL Cholesterol Cal: 19 mg/dL (ref 5–40)

## 2022-11-20 ENCOUNTER — Other Ambulatory Visit: Payer: Self-pay | Admitting: Internal Medicine

## 2022-11-20 DIAGNOSIS — R7303 Prediabetes: Secondary | ICD-10-CM

## 2022-12-18 ENCOUNTER — Encounter (HOSPITAL_COMMUNITY): Payer: Self-pay | Admitting: Nephrology

## 2022-12-18 ENCOUNTER — Other Ambulatory Visit (HOSPITAL_COMMUNITY): Payer: Self-pay | Admitting: Nephrology

## 2022-12-18 DIAGNOSIS — Z6837 Body mass index (BMI) 37.0-37.9, adult: Secondary | ICD-10-CM

## 2022-12-18 DIAGNOSIS — N1832 Chronic kidney disease, stage 3b: Secondary | ICD-10-CM

## 2022-12-18 DIAGNOSIS — I129 Hypertensive chronic kidney disease with stage 1 through stage 4 chronic kidney disease, or unspecified chronic kidney disease: Secondary | ICD-10-CM

## 2022-12-18 DIAGNOSIS — N280 Ischemia and infarction of kidney: Secondary | ICD-10-CM

## 2022-12-18 DIAGNOSIS — N170 Acute kidney failure with tubular necrosis: Secondary | ICD-10-CM

## 2023-01-15 ENCOUNTER — Ambulatory Visit (HOSPITAL_COMMUNITY)
Admission: RE | Admit: 2023-01-15 | Discharge: 2023-01-15 | Disposition: A | Discharge status: Home / Self Care | Payer: Medicare HMO | Source: Ambulatory Visit | Attending: Nephrology | Admitting: Nephrology

## 2023-01-15 DIAGNOSIS — N280 Ischemia and infarction of kidney: Secondary | ICD-10-CM | POA: Diagnosis present

## 2023-01-15 DIAGNOSIS — I129 Hypertensive chronic kidney disease with stage 1 through stage 4 chronic kidney disease, or unspecified chronic kidney disease: Secondary | ICD-10-CM | POA: Insufficient documentation

## 2023-01-15 DIAGNOSIS — N170 Acute kidney failure with tubular necrosis: Secondary | ICD-10-CM

## 2023-01-15 DIAGNOSIS — Z6837 Body mass index (BMI) 37.0-37.9, adult: Secondary | ICD-10-CM | POA: Insufficient documentation

## 2023-01-15 DIAGNOSIS — N1832 Chronic kidney disease, stage 3b: Secondary | ICD-10-CM | POA: Diagnosis present

## 2023-01-15 MED ORDER — GADOBUTROL 1 MMOL/ML IV SOLN
10.0000 mL | Freq: Once | INTRAVENOUS | Status: AC | PRN
  Administered 2023-01-15: 10 mL via INTRAVENOUS

## 2023-01-20 ENCOUNTER — Ambulatory Visit: Payer: Medicare HMO | Admitting: Internal Medicine

## 2023-01-20 ENCOUNTER — Encounter: Payer: Self-pay | Admitting: Adult Health

## 2023-01-20 ENCOUNTER — Ambulatory Visit: Payer: Medicare HMO | Admitting: Adult Health

## 2023-01-20 VITALS — BP 136/72 | HR 69 | Temp 98.2°F | Ht 66.0 in | Wt 226.0 lb

## 2023-01-20 DIAGNOSIS — G4733 Obstructive sleep apnea (adult) (pediatric): Secondary | ICD-10-CM | POA: Diagnosis not present

## 2023-01-20 DIAGNOSIS — Z23 Encounter for immunization: Secondary | ICD-10-CM

## 2023-01-20 LAB — POCT I-STAT CREATININE: Creatinine, Ser: 1.3 mg/dL — ABNORMAL HIGH (ref 0.44–1.00)

## 2023-01-20 NOTE — Assessment & Plan Note (Signed)
Excellent control and compliance on CPAP.  Continue on current settings. Patient education given on sleep apnea  Plan  Patient Instructions  Continue on CPAP at bedtime Work on healthy weight Do not drive if sleepy Follow-up with Dr. Elsworth Soho in 1 year and as needed
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
## 2023-01-20 NOTE — Addendum Note (Signed)
Addended by: Loma Sousa on: 01/20/2023 01:34 PM   Modules accepted: Orders

## 2023-01-20 NOTE — Patient Instructions (Signed)
Continue on CPAP at bedtime Work on healthy weight Do not drive if sleepy Follow-up with Dr. Alva in 1 year and as needed  

## 2023-01-20 NOTE — Assessment & Plan Note (Signed)
Healthy weight loss discussed.  

## 2023-03-18 ENCOUNTER — Ambulatory Visit (INDEPENDENT_AMBULATORY_CARE_PROVIDER_SITE_OTHER): Payer: Medicare HMO | Admitting: Internal Medicine

## 2023-03-18 ENCOUNTER — Encounter: Payer: Self-pay | Admitting: Internal Medicine

## 2023-03-18 VITALS — BP 138/70 | HR 85 | Temp 98.2°F | Resp 16

## 2023-03-18 DIAGNOSIS — R051 Acute cough: Secondary | ICD-10-CM | POA: Diagnosis not present

## 2023-03-18 DIAGNOSIS — H66002 Acute suppurative otitis media without spontaneous rupture of ear drum, left ear: Secondary | ICD-10-CM | POA: Diagnosis not present

## 2023-03-18 MED ORDER — AMOXICILLIN-POT CLAVULANATE 875-125 MG PO TABS: 1.0000 | ORAL_TABLET | Freq: Two times a day (BID) | ORAL | 0 refills | Status: DC

## 2023-03-18 NOTE — Patient Instructions (Addendum)
Thank you for trusting me with your care. To recap, today we discussed the following:  You have an LEFT middle ear infection and bronchitis. Take antibiotic as prescribed.  You take Robitussin to help with the symptom of cough.  You can take Mucinex if you start to feel congested. Follow up if not improving with antibiotics and I will send you for chest xray.

## 2023-03-18 NOTE — Progress Notes (Unsigned)
   HPI:[name] is a 62 y.o. female who presents for evaluation of cough.  Patient began having cough last Wednesday.  She has been coughing up mucus and feeling worse in the evenings. Subjective fever and also sweating at night.  She has had no energy.  She initially felt she was getting better and now feeling worse again.  She has been taking Aleve cold and sinus and also Claritin.  Has tried lemon and honey tea for symptom management of sore throat.  Her symptoms have not improved so she made appointment for evaluation.   Physical Exam: Vitals:   03/18/23 1048  BP: 138/70  Pulse: 85  Resp: 16  Temp: 98.2 F (36.8 C)  TempSrc: Oral  SpO2: 98%     Physical Exam Constitutional:      General: She is not in acute distress.    Appearance: She is ill-appearing (coughing throughout exam).  HENT:     Right Ear: Tympanic membrane and ear canal normal.     Left Ear: Ear canal normal. A middle ear effusion (cloudy) is present. Tympanic membrane is erythematous and bulging.  Cardiovascular:     Rate and Rhythm: Normal rate and regular rhythm.     Heart sounds: No murmur heard. Pulmonary:     Effort: Pulmonary effort is normal. No respiratory distress.     Breath sounds: No wheezing.      Assessment & Plan:   There are no diagnoses linked to this encounter.    Lorene Dy, MD

## 2023-03-19 DIAGNOSIS — R051 Acute cough: Secondary | ICD-10-CM | POA: Insufficient documentation

## 2023-03-19 DIAGNOSIS — H66002 Acute suppurative otitis media without spontaneous rupture of ear drum, left ear: Secondary | ICD-10-CM | POA: Insufficient documentation

## 2023-03-19 NOTE — Assessment & Plan Note (Addendum)
Symptoms for almost a week, initially improved and then worsened suggesting possible bacterial URI. Will test for viruses today, she would like to take precautions around her grandchildren if this is a viral respiratory infection. Aware we do not test for all viruses. She has middle ear infection which is going to be treated with antibiotics and subsequently would treat bacterial URI. She will continue OTC medications for cough and congestion.

## 2023-03-19 NOTE — Assessment & Plan Note (Addendum)
Prescribed amoxicillin-clavulanate (AUGMENTIN) 875-125 MG tablet; Take 1 tablet by mouth 2 (two) times daily.  Dispense: 14 tablet; Refill: 0 Follow up if symptoms worsen or fail to improve

## 2023-03-20 LAB — COVID-19, FLU A+B AND RSV
Influenza A, NAA: NOT DETECTED
Influenza B, NAA: NOT DETECTED
RSV, NAA: NOT DETECTED
SARS-CoV-2, NAA: NOT DETECTED

## 2023-03-21 ENCOUNTER — Telehealth: Payer: Self-pay | Admitting: Internal Medicine

## 2023-03-21 MED ORDER — ALBUTEROL SULFATE HFA 108 (90 BASE) MCG/ACT IN AERS: 2.0000 | INHALATION_SPRAY | Freq: Four times a day (QID) | RESPIRATORY_TRACT | 0 refills | Status: DC | PRN

## 2023-03-21 NOTE — Telephone Encounter (Signed)
Albuterol inhaler sent to [facility] on [address].

## 2023-03-21 NOTE — Telephone Encounter (Signed)
Pt called in requesting inhaler. States when coming from outside in can't breath. Last visit 3/19

## 2023-03-25 ENCOUNTER — Encounter: Payer: Medicare HMO | Admitting: Internal Medicine

## 2023-04-28 ENCOUNTER — Telehealth: Payer: Self-pay | Admitting: Internal Medicine

## 2023-04-28 NOTE — Telephone Encounter (Signed)
Called patient to schedule Medicare Annual Wellness Visit (AWV). Left message for patient to call back and schedule Medicare Annual Wellness Visit (AWV).  Last date of AWV: 03/05/2022    Please schedule an appointment at any time with Abby, NHA. .  If any questions, please contact me at [phone].  Thank you,  Judeth Cornfield,  AMB Clinical Support [facility] AWV Program Direct Dial ??[phone]

## 2023-06-07 ENCOUNTER — Other Ambulatory Visit: Payer: Self-pay | Admitting: Internal Medicine

## 2023-06-07 DIAGNOSIS — R7303 Prediabetes: Secondary | ICD-10-CM

## 2023-08-11 ENCOUNTER — Encounter: Payer: Self-pay | Admitting: *Deleted

## 2023-10-07 ENCOUNTER — Telehealth: Payer: Self-pay | Admitting: Internal Medicine

## 2023-10-07 NOTE — Telephone Encounter (Signed)
Questionnaire in review

## 2023-10-07 NOTE — Telephone Encounter (Signed)
  Procedure: Colonoscopy  Estimated body mass index is 36.48 kg/m as calculated from the following:   Height as of 01/20/23: 5\' 6"  (1.676 m).   Weight as of 01/20/23: 226 lb (102.5 kg).  Have you had a colonoscopy before?  2014, Dr. Jarold Motto  Do you have family history of colon cancer?  no  Do you have a family history of polyps? yes  Previous colonoscopy with polyps removed? no  Do you have a history colorectal cancer?   no  Are you diabetic?  Pre-diabetic  Do you have a prosthetic or mechanical heart valve? no  Do you have a pacemaker/defibrillator?   no  Have you had endocarditis/atrial fibrillation?  no  Do you use supplemental oxygen/CPAP?  no  Have you had joint replacement within the last 12 months?  no  Do you tend to be constipated or have to use laxatives?  no   Do you have history of alcohol use? If yes, how much and how often.  2 times per week maybe  Do you have history or are you using drugs? If yes, what do are you  using?  no  Have you ever had a stroke/heart attack?  no  Have you ever had a heart or other vascular stent placed,?no  Do you take weight loss medication? yes  female patients,: have you had a hysterectomy? no                              are you post menopausal?  yes                              do you still have your menstrual cycle? no    Date of last menstrual period?   Do you take any blood-thinning medications such as: (Plavix, aspirin, Coumadin, Aggrenox, Brilinta, Xarelto, Eliquis, Pradaxa, Savaysa or Effient)? no  If yes we need the name, milligram, dosage and who is prescribing doctor:               Current Outpatient Medications  Medication Sig Dispense Refill   ALPRAZolam (XANAX) 0.5 MG tablet Take 0.5 mg by mouth daily as needed.     escitalopram (LEXAPRO) 10 MG tablet Take 10 mg by mouth daily.     hydrochlorothiazide (HYDRODIURIL) 25 MG tablet TAKE 1 TABLET(25 MG) BY MOUTH DAILY 90 tablet 3   lamoTRIgine (LAMICTAL) 200  MG tablet Take 200 mg by mouth daily.  5   lisinopril (ZESTRIL) 5 MG tablet Take 5 mg by mouth daily.     lurasidone (LATUDA) 20 MG TABS tablet SMARTSIG:By Mouth Every Evening     OZEMPIC, 0.25 OR 0.5 MG/DOSE, 2 MG/3ML SOPN SMARTSIG:0.25 Milligram(s) SUB-Q Once a Week     No current facility-administered medications for this visit.    No Known Allergies

## 2023-10-07 NOTE — Addendum Note (Signed)
Addended by: Armstead Peaks on: 10/07/2023 04:36 PM   Modules accepted: Orders

## 2023-10-10 NOTE — Telephone Encounter (Signed)
ASA 2. Needs BMP.  Hold Ozempic for 1 week prior

## 2023-10-14 ENCOUNTER — Other Ambulatory Visit: Payer: Self-pay | Admitting: *Deleted

## 2023-10-14 ENCOUNTER — Encounter: Payer: Self-pay | Admitting: *Deleted

## 2023-10-14 DIAGNOSIS — Z1211 Encounter for screening for malignant neoplasm of colon: Secondary | ICD-10-CM

## 2023-10-14 MED ORDER — PEG 3350-KCL-NA BICARB-NACL 420 G PO SOLR: 4000.0000 mL | Freq: Once | ORAL | 0 refills | Status: AC

## 2023-10-14 NOTE — Telephone Encounter (Signed)
Pt has been scheduled for 11/04/23 with Dr.Carver.  Instructions sent via MyChart and prep sent to the pharmacy.

## 2023-10-21 ENCOUNTER — Encounter: Payer: Self-pay | Admitting: *Deleted

## 2023-10-21 NOTE — Telephone Encounter (Signed)
Referral completed, [facility] apt letter sent to PCP

## 2023-11-04 ENCOUNTER — Encounter (HOSPITAL_COMMUNITY): Payer: Self-pay

## 2023-11-04 ENCOUNTER — Ambulatory Visit (HOSPITAL_COMMUNITY): Payer: Medicare HMO | Admitting: Certified Registered"

## 2023-11-04 ENCOUNTER — Other Ambulatory Visit: Payer: Self-pay

## 2023-11-04 ENCOUNTER — Encounter (HOSPITAL_COMMUNITY)
Admission: RE | Disposition: A | Discharge status: Home / Self Care | Payer: Self-pay | Source: Home / Self Care | Attending: Internal Medicine

## 2023-11-04 ENCOUNTER — Ambulatory Visit (HOSPITAL_COMMUNITY)
Admission: RE | Admit: 2023-11-04 | Discharge: 2023-11-04 | Disposition: A | Discharge status: Home / Self Care | Payer: Medicare HMO | Attending: Internal Medicine | Admitting: Internal Medicine

## 2023-11-04 DIAGNOSIS — K635 Polyp of colon: Secondary | ICD-10-CM

## 2023-11-04 DIAGNOSIS — D124 Benign neoplasm of descending colon: Secondary | ICD-10-CM | POA: Diagnosis not present

## 2023-11-04 DIAGNOSIS — F411 Generalized anxiety disorder: Secondary | ICD-10-CM | POA: Diagnosis not present

## 2023-11-04 DIAGNOSIS — Z87891 Personal history of nicotine dependence: Secondary | ICD-10-CM | POA: Insufficient documentation

## 2023-11-04 DIAGNOSIS — I129 Hypertensive chronic kidney disease with stage 1 through stage 4 chronic kidney disease, or unspecified chronic kidney disease: Secondary | ICD-10-CM | POA: Diagnosis not present

## 2023-11-04 DIAGNOSIS — F319 Bipolar disorder, unspecified: Secondary | ICD-10-CM | POA: Diagnosis not present

## 2023-11-04 DIAGNOSIS — Z1211 Encounter for screening for malignant neoplasm of colon: Secondary | ICD-10-CM

## 2023-11-04 DIAGNOSIS — K5730 Diverticulosis of large intestine without perforation or abscess without bleeding: Secondary | ICD-10-CM | POA: Diagnosis not present

## 2023-11-04 DIAGNOSIS — G4733 Obstructive sleep apnea (adult) (pediatric): Secondary | ICD-10-CM | POA: Insufficient documentation

## 2023-11-04 DIAGNOSIS — D123 Benign neoplasm of transverse colon: Secondary | ICD-10-CM | POA: Diagnosis not present

## 2023-11-04 DIAGNOSIS — F413 Other mixed anxiety disorders: Secondary | ICD-10-CM

## 2023-11-04 HISTORY — PX: COLONOSCOPY WITH PROPOFOL: SHX5780

## 2023-11-04 HISTORY — PX: POLYPECTOMY: SHX5525

## 2023-11-04 SURGERY — COLONOSCOPY WITH PROPOFOL
Anesthesia: General

## 2023-11-04 MED ORDER — LACTATED RINGERS IV SOLN: INTRAVENOUS | Status: DC | PRN

## 2023-11-04 MED ORDER — PROPOFOL 500 MG/50ML IV EMUL
INTRAVENOUS | Status: DC | PRN
Start: 2023-11-04 — End: 2023-11-04
  Administered 2023-11-04: 150 ug/kg/min via INTRAVENOUS

## 2023-11-04 MED ORDER — STERILE WATER FOR IRRIGATION IR SOLN
Status: DC | PRN
  Administered 2023-11-04: 60 mL

## 2023-11-04 MED ORDER — DEXMEDETOMIDINE HCL IN NACL 80 MCG/20ML IV SOLN
INTRAVENOUS | Status: AC
  Filled 2023-11-04: qty 20

## 2023-11-04 MED ORDER — LIDOCAINE HCL (CARDIAC) PF 100 MG/5ML IV SOSY
PREFILLED_SYRINGE | INTRAVENOUS | Status: DC | PRN
Start: 2023-11-04 — End: 2023-11-04
  Administered 2023-11-04: 100 mg via INTRAVENOUS

## 2023-11-04 MED ORDER — DEXMEDETOMIDINE HCL IN NACL 80 MCG/20ML IV SOLN
INTRAVENOUS | Status: DC | PRN
Start: 2023-11-04 — End: 2023-11-04
  Administered 2023-11-04: 6 ug via INTRAVENOUS

## 2023-11-04 MED ORDER — PROPOFOL 10 MG/ML IV BOLUS
INTRAVENOUS | Status: DC | PRN
Start: 2023-11-04 — End: 2023-11-04

## 2023-11-04 MED ORDER — PROPOFOL 10 MG/ML IV BOLUS
INTRAVENOUS | Status: DC | PRN
  Administered 2023-11-04: 80 mg via INTRAVENOUS

## 2023-11-04 NOTE — Transfer of Care (Signed)
Immediate Anesthesia Transfer of Care Note  Patient: [name] [name]  Procedure(s) Performed: COLONOSCOPY WITH PROPOFOL POLYPECTOMY  Patient Location: Endoscopy Unit  Anesthesia Type:General  Level of Consciousness: drowsy and patient cooperative  Airway & Oxygen Therapy: Patient Spontanous Breathing and Patient connected to nasal cannula oxygen  Post-op Assessment: Report given to RN and Post -op Vital signs reviewed and stable  Post vital signs: Reviewed and stable  Last Vitals:  Vitals Value Taken Time  BP 130/75 11/04/23 1203  Temp 36.6 C 11/04/23 1203  Pulse 72 11/04/23 1203  Resp 15 11/04/23 1203  SpO2 100 % 11/04/23 1203    Last Pain:  Vitals:   11/04/23 1203  TempSrc: Oral  PainSc: 0-No pain      Patients Stated Pain Goal: 8 (11/04/23 1018)  Complications: No notable events documented.

## 2023-11-04 NOTE — Anesthesia Preprocedure Evaluation (Signed)
Anesthesia Evaluation  Patient identified by MRN, date of birth, ID band Patient awake    Reviewed: Allergy & Precautions, H&P , NPO status , Patient's Chart, lab work & pertinent test results, reviewed documented beta blocker date and time   Airway Mallampati: II  TM Distance: >3 FB Neck ROM: full    Dental no notable dental hx.    Pulmonary neg pulmonary ROS, sleep apnea , pneumonia, former smoker   Pulmonary exam normal breath sounds clear to auscultation       Cardiovascular Exercise Tolerance: Good hypertension, negative cardio ROS  Rhythm:regular Rate:Normal     Neuro/Psych  PSYCHIATRIC DISORDERS Anxiety Depression Bipolar Disorder   negative neurological ROS  negative psych ROS   GI/Hepatic negative GI ROS, Neg liver ROS,,,  Endo/Other  negative endocrine ROS    Renal/GU CRFRenal diseasenegative Renal ROS  negative genitourinary   Musculoskeletal   Abdominal   Peds  Hematology negative hematology ROS (+)   Anesthesia Other Findings   Reproductive/Obstetrics negative OB ROS                             Anesthesia Physical Anesthesia Plan  ASA: 3  Anesthesia Plan: General   Post-op Pain Management:    Induction:   PONV Risk Score and Plan: Propofol infusion  Airway Management Planned:   Additional Equipment:   Intra-op Plan:   Post-operative Plan:   Informed Consent: I have reviewed the patients History and Physical, chart, labs and discussed the procedure including the risks, benefits and alternatives for the proposed anesthesia with the patient or authorized representative who has indicated his/her understanding and acceptance.     Dental Advisory Given  Plan Discussed with: CRNA  Anesthesia Plan Comments:        Anesthesia Quick Evaluation

## 2023-11-04 NOTE — Discharge Instructions (Addendum)
  Colonoscopy Discharge Instructions  Read the instructions outlined below and refer to this sheet in the next few weeks. These discharge instructions provide you with general information on caring for yourself after you leave the hospital. Your doctor may also give you specific instructions. While your treatment has been planned according to the most current medical practices available, unavoidable complications occasionally occur.   ACTIVITY You may resume your regular activity, but move at a slower pace for the next 24 hours.  Take frequent rest periods for the next 24 hours.  Walking will help get rid of the air and reduce the bloated feeling in your belly (abdomen).  No driving for 24 hours (because of the medicine (anesthesia) used during the test).   Do not sign any important legal documents or operate any machinery for 24 hours (because of the anesthesia used during the test).  NUTRITION Drink plenty of fluids.  You may resume your normal diet as instructed by your doctor.  Begin with a light meal and progress to your normal diet. Heavy or fried foods are harder to digest and may make you feel sick to your stomach (nauseated).  Avoid alcoholic beverages for 24 hours or as instructed.  MEDICATIONS You may resume your normal medications unless your doctor tells you otherwise.  WHAT YOU CAN EXPECT TODAY Some feelings of bloating in the abdomen.  Passage of more gas than usual.  Spotting of blood in your stool or on the toilet paper.  IF YOU HAD POLYPS REMOVED DURING THE COLONOSCOPY: No aspirin products for 7 days or as instructed.  No alcohol for 7 days or as instructed.  Eat a soft diet for the next 24 hours.  FINDING OUT THE RESULTS OF YOUR TEST Not all test results are available during your visit. If your test results are not back during the visit, make an appointment with your caregiver to find out the results. Do not assume everything is normal if you have not heard from your  caregiver or the medical facility. It is important for you to follow up on all of your test results.  SEEK IMMEDIATE MEDICAL ATTENTION IF: You have more than a spotting of blood in your stool.  Your belly is swollen (abdominal distention).  You are nauseated or vomiting.  You have a temperature over 101.  You have abdominal pain or discomfort that is severe or gets worse throughout the day.   Your colonoscopy revealed 3 polyp(s) which I removed successfully. Await pathology results, my office will contact you. I recommend repeating colonoscopy in 6 years for surveillance purposes.   You also have diverticulosis. I would recommend increasing fiber in your diet or adding OTC Benefiber/Metamucil. Be sure to drink at least 4 to 6 glasses of water daily. Follow-up with GI as needed.   I hope you have a great rest of your week!  Hennie Duos. Marletta Lor, D.O. Gastroenterology and Hepatology [facility]

## 2023-11-04 NOTE — H&P (Signed)
Primary Care Physician:  Roe Rutherford, NP Primary Gastroenterologist:  Dr. Marletta Lor  Pre-Procedure History & Physical: HPI:  [name] [name] is a 62 y.o. female is here for a colonoscopy for colon cancer screening purposes.  Patient denies any family history of colorectal cancer.  No melena or hematochezia.  No abdominal pain or unintentional weight loss.  No change in bowel habits.  Overall feels well from a GI standpoint.  Past Medical History:  Diagnosis Date   Abnormal Pap smear    Adult ADHD    Anxiety    Arthritis    "knees" (02/10/2018)   Bipolar disorder (HCC)    Cholelithiases    04/2015   CKD (chronic kidney disease) 08/15/2020   Saw Dr Wolfgang Phoenix 08/14/20   Depression 04/15/2013   Ear infection 03/19/2016   Essential hypertension, benign    ETOH abuse    drinks wine 5 day/week   Family history of brain cancer    Family history of BRCA1 gene positive    Family history of breast cancer    Family history of ovarian cancer    Family history of pancreatic cancer    Family history of stomach cancer    Family history of thyroid cancer    GAD (generalized anxiety disorder)    History of blood transfusion 1980s   "related to miscarriage"   Incompetent cervix    Obesity    OSA on CPAP    Pneumonia    "used to have it alot; nothing in years" (02/10/2018)   PTSD (post-traumatic stress disorder)    Thickened endometrium 05/02/2014   Vaginal Pap smear, abnormal    Vertigo 03/19/2016    Past Surgical History:  Procedure Laterality Date   CATARACT EXTRACTION     CERVICAL CERCLAGE     CHOLECYSTECTOMY N/A 05/24/2015   Procedure: LAPAROSCOPIC CHOLECYSTECTOMY WITH INTRAOPERATIVE CHOLANGIOGRAM;  Surgeon: Franky Macho Md, MD;  Location: [facility];  Service: General;  Laterality: N/A;   COLONOSCOPY     DILATION AND CURETTAGE OF UTERUS     JOINT REPLACEMENT     KNEE ARTHROSCOPY Right 2015   TONSILLECTOMY     TOTAL KNEE ARTHROPLASTY Right 02/10/2018   TOTAL KNEE ARTHROPLASTY Right  02/10/2018   Procedure: TOTAL KNEE ARTHROPLASTY;  Surgeon: Marcene Corning, MD;  Location: [facility];  Service: Orthopedics;  Laterality: Right;    Prior to Admission medications   Medication Sig Start Date End Date Taking? Authorizing Provider  ALPRAZolam Prudy Feeler) 0.5 MG tablet Take 0.5 mg by mouth daily as needed. 09/30/23  Yes [provider]  escitalopram (LEXAPRO) 10 MG tablet Take 10 mg by mouth daily.   Yes [provider]  hydrochlorothiazide (HYDRODIURIL) 25 MG tablet TAKE 1 TABLET(25 MG) BY MOUTH DAILY 09/26/22  Yes Cyril Mourning A, NP  lamoTRIgine (LAMICTAL) 200 MG tablet Take 200 mg by mouth daily. 12/19/14  Yes [provider]  lisinopril (ZESTRIL) 5 MG tablet Take 5 mg by mouth daily.   Yes [provider]  lurasidone (LATUDA) 20 MG TABS tablet SMARTSIG:By Mouth Every Evening   Yes [provider]  traZODone (DESYREL) 50 MG tablet Take 50 mg by mouth at bedtime as needed for sleep.   Yes [provider]  OZEMPIC, 0.25 OR 0.5 MG/DOSE, 2 MG/3ML SOPN SMARTSIG:0.25 Milligram(s) SUB-Q Once a Week 09/17/23   [provider]    Allergies as of 10/14/2023   (No Known Allergies)    Family History  Problem Relation Age of Onset  Hypertension Father    Cancer Father 81       sarcoma on face   Hypertension Mother    Pancreatic cancer Mother 21   Asthma Mother    Sleep apnea Mother    Coronary artery disease Maternal Grandfather    Sleep apnea Brother    Stomach cancer Paternal Aunt        dx in her late 35s   Stomach cancer Paternal Uncle 66   Brain cancer Paternal Uncle 60   Cirrhosis Paternal Grandmother        never drank   Stroke Maternal Aunt    Lung cancer Maternal Aunt 57       lung   Heart attack Maternal Aunt    Diabetes Other    Ovarian cancer Maternal Aunt 60       ovarian cancer   Dementia Maternal Aunt    Breast cancer Cousin 31       maternal cousin   BRCA 1/2 Cousin        BRCA1 positive  ([id number])   Thyroid cancer Paternal Aunt        dx in her 50s/60s   Cancer Paternal Uncle        unknown type   Pancreatic cancer Cousin        dx in his 61s, paternal cousin   Colon cancer Neg Hx    Esophageal cancer Neg Hx    Rectal cancer Neg Hx     Social History   Socioeconomic History   Marital status: Married    Spouse name: [name] [name]   Number of children: 2   Years of education: Not on file   Highest education level: Not on file  Occupational History   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    Comment: retired   Tobacco Use   Smoking status: Former    Current packs/day: 0.00    Average packs/day: 1 pack/day for 25.0 years (25.0 ttl pk-yrs)    Types: Cigarettes    Start date: 12/30/1980    Quit date: 12/30/2005    Years since quitting: 17.8   Smokeless tobacco: Never  Vaping Use   Vaping status: Never Used  Substance and Sexual Activity   Alcohol use: Yes    Alcohol/week: 7.0 standard drinks of alcohol    Types: 5 Glasses of wine, 2 Standard drinks or equivalent per week    Comment: occ   Drug use: No   Sexual activity: Yes    Birth control/protection: Post-menopausal  Other Topics Concern   Not on file  Social History Narrative   Not on file   Social Determinants of Health   Financial Resource Strain: Low Risk  (03/25/2023)   Received from [facility], [facility]   Overall Financial Resource Strain (CARDIA)    Difficulty of Paying Living Expenses: Not hard at all  Food Insecurity: No Food Insecurity (03/25/2023)   Received from [facility], [facility]   Hunger Vital Sign    Worried About Running Out of Food in the Last Year: Never true    Ran Out of Food in the Last Year: Never true  Transportation Needs: No Transportation Needs (03/25/2023)   Received from [facility], [facility]   PRAPARE - Transportation    Lack of Transportation (Medical): No    Lack of Transportation  (Non-Medical): No  Physical Activity: Unknown (03/25/2023)   Received from [facility], [facility]   Exercise  Vital Sign    Days of Exercise per Week: 0 days    Minutes of Exercise per Session: Not on file  Stress: No Stress Concern Present (03/25/2023)   Received from [facility], [facility] - Occupational Stress Questionnaire    Feeling of Stress : Not at all  Social Connections: Socially Integrated (03/25/2023)   Received from [facility] [place] [facility], [facility]   Social Network    How would you rate your social network (family, work, friends)?: Good participation with social networks  Intimate Partner Violence: Not At Risk (03/25/2023)   Received from [facility], [facility]   HITS    Over the last 12 months how often did your partner physically hurt you?: 1    Over the last 12 months how often did your partner insult you or talk down to you?: 1    Over the last 12 months how often did your partner threaten you with physical harm?: 1    Over the last 12 months how often did your partner scream or curse at you?: 1    Review of Systems: See HPI, otherwise negative ROS  Physical Exam: Vital signs in last 24 hours: Temp:  [98.4 F (36.9 C)] 98.4 F (36.9 C) (11/05 1028) Pulse Rate:  [66] 66 (11/05 1028) Resp:  [16] 16 (11/05 1028) BP: (148)/(60) 148/60 (11/05 1028) SpO2:  [98 %] 98 % (11/05 1028) Weight:  [93 kg] 93 kg (11/05 1018)   General:   Alert,  Well-developed, well-nourished, pleasant and cooperative in NAD Head:  Normocephalic and atraumatic. Eyes:  Sclera clear, no icterus.   Conjunctiva pink. Ears:  Normal auditory acuity. Nose:  No deformity, discharge,  or lesions. Msk:  Symmetrical without gross deformities. Normal posture. Extremities:  Without clubbing or edema. Neurologic:  Alert and  oriented x4;  grossly normal neurologically. Skin:  Intact without significant lesions or rashes. Psych:  Alert and  cooperative. Normal mood and affect.  Impression/Plan: NALIYA GISH is here for a colonoscopy to be performed for colon cancer screening purposes.  The risks of the procedure including infection, bleed, or perforation as well as benefits, limitations, alternatives and imponderables have been reviewed with the patient. Questions have been answered. All parties agreeable.

## 2023-11-04 NOTE — Op Note (Signed)
[facility] Patient Name: [name] [name] Procedure Date: 11/04/2023 11:29 AM MRN: [id number] Date of Birth: 26-Mar-1961 Attending MD: Hennie Duos. Marletta Lor , [place], [id number] CSN: [id number] Age: 62 Admit Type: Outpatient Procedure:                Colonoscopy Indications:              Screening for colorectal malignant neoplasm Providers:                Hennie Duos. Marletta Lor, DO, Buel Ream. Thomasena Edis RN, RN,                            Zena Amos Referring MD:              Medicines:                See the Anesthesia note for documentation of the                            administered medications Complications:            No immediate complications. Estimated Blood Loss:     Estimated blood loss was minimal. Procedure:                Pre-Anesthesia Assessment:                           - The anesthesia plan was to use monitored                            anesthesia care (MAC).                           After obtaining informed consent, the colonoscope                            was passed under direct vision. Throughout the                            procedure, the patient's blood pressure, pulse, and                            oxygen saturations were monitored continuously. The                            [id number]-HQ190L ([id number]) scope was introduced through                            the anus and advanced to the the terminal ileum,                            with identification of the appendiceal orifice and                            IC valve. The colonoscopy was performed without                            difficulty. The patient  tolerated the procedure                            well. The quality of the bowel preparation was                            evaluated using the BBPS [facility] Bowel Preparation                            Scale) with scores of: Right Colon = 3, Transverse                            Colon = 3 and Left Colon = 3 (entire mucosa seen                            well with  no residual staining, small fragments of                            stool or opaque liquid). The total BBPS score                            equals 9. Scope In: 11:43:46 AM Scope Out: 11:56:03 AM Scope Withdrawal Time: 0 hours 11 minutes 57 seconds  Total Procedure Duration: 0 hours 12 minutes 17 seconds  Findings:      A few small-mouthed diverticula were found in the sigmoid colon.      Two sessile polyps were found in the transverse colon. The polyps were 6       to 8 mm in size. These polyps were removed with a cold snare. Resection       and retrieval were complete.      A 5 mm polyp was found in the descending colon. The polyp was sessile.       The polyp was removed with a cold snare. Resection and retrieval were       complete.      The exam was otherwise without abnormality. Impression:               - Diverticulosis in the sigmoid colon.                           - Two 6 to 8 mm polyps in the transverse colon,                            removed with a cold snare. Resected and retrieved.                           - One 5 mm polyp in the descending colon, removed                            with a cold snare. Resected and retrieved.                           - The examination was otherwise normal. Moderate Sedation:      Per Anesthesia Care Recommendation:           -  Patient has a contact number available for                            emergencies. The signs and symptoms of potential                            delayed complications were discussed with the                            patient. Return to normal activities tomorrow.                            Written discharge instructions were provided to the                            patient.                           - Resume previous diet.                           - Continue present medications.                           - Await pathology results.                           - Repeat colonoscopy in 5 years for surveillance.                            - Return to GI clinic PRN. Procedure Code(s):        --- Professional ---                           [phone], Colonoscopy, flexible; with removal of                            tumor(s), polyp(s), or other lesion(s) by snare                            technique Diagnosis Code(s):        --- Professional ---                           Z12.11, Encounter for screening for malignant                            neoplasm of colon                           D12.3, Benign neoplasm of transverse colon (hepatic                            flexure or splenic flexure)                           D12.4, Benign neoplasm of  descending colon                           K57.30, Diverticulosis of large intestine without                            perforation or abscess without bleeding CPT copyright 2022 American Medical Association. All rights reserved. The codes documented in this report are preliminary and upon coder review may  be revised to meet current compliance requirements. Hennie Duos. Marletta Lor, DO Hennie Duos. Marletta Lor, DO 11/04/2023 12:02:01 PM This report has been signed electronically. Number of Addenda: 0

## 2023-11-05 LAB — SURGICAL PATHOLOGY

## 2023-11-05 NOTE — Anesthesia Postprocedure Evaluation (Signed)
Anesthesia Post Note  Patient: [name] [name]  Procedure(s) Performed: COLONOSCOPY WITH PROPOFOL POLYPECTOMY  Patient location during evaluation: Phase II Anesthesia Type: General Level of consciousness: awake Pain management: pain level controlled Vital Signs Assessment: post-procedure vital signs reviewed and stable Respiratory status: spontaneous breathing and respiratory function stable Cardiovascular status: blood pressure returned to baseline and stable Postop Assessment: no headache and no apparent nausea or vomiting Anesthetic complications: no Comments: Late entry   No notable events documented.   Last Vitals:  Vitals:   11/04/23 1028 11/04/23 1203  BP: (!) 148/60 130/75  Pulse: 66 72  Resp: 16 15  Temp: 36.9 C 36.6 C  SpO2: 98% 100%    Last Pain:  Vitals:   11/04/23 1203  TempSrc: Oral  PainSc: 0-No pain                 Windell Norfolk

## 2023-11-10 ENCOUNTER — Encounter (HOSPITAL_COMMUNITY): Payer: Self-pay | Admitting: Internal Medicine

## 2024-02-26 ENCOUNTER — Other Ambulatory Visit (HOSPITAL_COMMUNITY): Payer: Self-pay | Admitting: Adult Health Nurse Practitioner

## 2024-02-26 DIAGNOSIS — Z1231 Encounter for screening mammogram for malignant neoplasm of breast: Secondary | ICD-10-CM

## 2024-03-03 ENCOUNTER — Ambulatory Visit (HOSPITAL_COMMUNITY)
Admission: RE | Admit: 2024-03-03 | Discharge: 2024-03-03 | Disposition: A | Discharge status: Home / Self Care | Payer: Medicare HMO | Source: Ambulatory Visit | Attending: Adult Health Nurse Practitioner | Admitting: Adult Health Nurse Practitioner

## 2024-03-03 DIAGNOSIS — Z1231 Encounter for screening mammogram for malignant neoplasm of breast: Secondary | ICD-10-CM | POA: Diagnosis present
